# Patient Record
Sex: Female | Born: 1935 | Race: White | Hispanic: No | Smoking: Never smoker
Health system: Southern US, Community
[De-identification: ages and names within clinical notes are randomized; demographics above are authoritative.]

## PROBLEM LIST (undated history)

## (undated) DIAGNOSIS — I4891 Unspecified atrial fibrillation: Secondary | ICD-10-CM

## (undated) DIAGNOSIS — M5430 Sciatica, unspecified side: Secondary | ICD-10-CM

## (undated) DIAGNOSIS — E079 Disorder of thyroid, unspecified: Secondary | ICD-10-CM

## (undated) DIAGNOSIS — G8929 Other chronic pain: Secondary | ICD-10-CM

## (undated) DIAGNOSIS — F0150 Vascular dementia without behavioral disturbance: Secondary | ICD-10-CM

## (undated) DIAGNOSIS — I639 Cerebral infarction, unspecified: Secondary | ICD-10-CM

## (undated) DIAGNOSIS — M545 Low back pain, unspecified: Secondary | ICD-10-CM

## (undated) DIAGNOSIS — M1990 Unspecified osteoarthritis, unspecified site: Secondary | ICD-10-CM

## (undated) DIAGNOSIS — I10 Essential (primary) hypertension: Secondary | ICD-10-CM

## (undated) DIAGNOSIS — E785 Hyperlipidemia, unspecified: Secondary | ICD-10-CM

## (undated) DIAGNOSIS — I059 Rheumatic mitral valve disease, unspecified: Secondary | ICD-10-CM

## (undated) DIAGNOSIS — R209 Unspecified disturbances of skin sensation: Secondary | ICD-10-CM

## (undated) HISTORY — DX: Sciatica, unspecified side: M54.30

## (undated) HISTORY — DX: Rheumatic mitral valve disease, unspecified: I05.9

## (undated) HISTORY — DX: Essential (primary) hypertension: I10

## (undated) HISTORY — DX: Cerebral infarction, unspecified: I63.9

## (undated) HISTORY — DX: Unspecified disturbances of skin sensation: R20.9

## (undated) HISTORY — DX: Hyperlipidemia, unspecified: E78.5

## (undated) HISTORY — DX: Disorder of thyroid, unspecified: E07.9

## (undated) HISTORY — DX: Unspecified osteoarthritis, unspecified site: M19.90

## (undated) HISTORY — DX: Low back pain: M54.5

## (undated) HISTORY — DX: Low back pain, unspecified: M54.50

## (undated) HISTORY — DX: Other chronic pain: G89.29

## (undated) HISTORY — PX: THYROIDECTOMY: SHX17

## (undated) HISTORY — PX: JOINT REPLACEMENT: SHX530

## (undated) HISTORY — PX: KNEE SURGERY: SHX244

## (undated) HISTORY — DX: Vascular dementia, unspecified severity, without behavioral disturbance, psychotic disturbance, mood disturbance, and anxiety: F01.50

## (undated) HISTORY — DX: Unspecified atrial fibrillation: I48.91

---

## 2006-11-05 ENCOUNTER — Ambulatory Visit: Payer: Self-pay | Admitting: Gastroenterology

## 2006-12-26 ENCOUNTER — Ambulatory Visit: Payer: Self-pay | Admitting: Internal Medicine

## 2007-04-26 LAB — HM DEXA SCAN: HM Dexa Scan: ABNORMAL

## 2007-05-06 ENCOUNTER — Ambulatory Visit: Payer: Self-pay | Admitting: Internal Medicine

## 2007-07-31 ENCOUNTER — Ambulatory Visit: Payer: Self-pay | Admitting: Internal Medicine

## 2008-03-02 ENCOUNTER — Other Ambulatory Visit: Payer: Self-pay

## 2008-03-02 ENCOUNTER — Ambulatory Visit: Payer: Self-pay | Admitting: General Practice

## 2008-03-17 ENCOUNTER — Inpatient Hospital Stay: Payer: Self-pay | Admitting: General Practice

## 2008-05-11 ENCOUNTER — Ambulatory Visit: Payer: Self-pay | Admitting: Internal Medicine

## 2008-08-03 ENCOUNTER — Ambulatory Visit: Payer: Self-pay | Admitting: Ophthalmology

## 2009-03-01 ENCOUNTER — Ambulatory Visit: Payer: Self-pay | Admitting: Internal Medicine

## 2009-03-21 ENCOUNTER — Ambulatory Visit: Payer: Self-pay | Admitting: Ophthalmology

## 2009-05-31 ENCOUNTER — Ambulatory Visit: Payer: Self-pay | Admitting: Internal Medicine

## 2009-06-25 HISTORY — PX: TOTAL HIP ARTHROPLASTY: SHX124

## 2009-08-03 ENCOUNTER — Emergency Department: Payer: Self-pay | Admitting: Emergency Medicine

## 2009-09-15 ENCOUNTER — Encounter: Payer: Self-pay | Admitting: Cardiovascular Disease

## 2009-09-15 ENCOUNTER — Ambulatory Visit: Payer: Self-pay | Admitting: Internal Medicine

## 2009-09-23 LAB — HM COLONOSCOPY

## 2009-10-06 ENCOUNTER — Encounter: Admission: RE | Admit: 2009-10-06 | Discharge: 2009-10-06 | Payer: Self-pay | Admitting: Neurosurgery

## 2009-10-11 ENCOUNTER — Ambulatory Visit: Payer: Self-pay | Admitting: Gastroenterology

## 2009-10-25 ENCOUNTER — Encounter: Admission: RE | Admit: 2009-10-25 | Discharge: 2009-10-25 | Payer: Self-pay | Admitting: Neurosurgery

## 2010-01-31 ENCOUNTER — Ambulatory Visit: Payer: Self-pay | Admitting: Anesthesiology

## 2010-02-23 DIAGNOSIS — I4891 Unspecified atrial fibrillation: Secondary | ICD-10-CM

## 2010-02-23 HISTORY — DX: Unspecified atrial fibrillation: I48.91

## 2010-03-01 ENCOUNTER — Encounter: Payer: Self-pay | Admitting: Cardiovascular Disease

## 2010-03-02 ENCOUNTER — Ambulatory Visit: Payer: Self-pay | Admitting: Cardiovascular Disease

## 2010-03-02 ENCOUNTER — Ambulatory Visit: Payer: Self-pay | Admitting: Anesthesiology

## 2010-03-02 ENCOUNTER — Inpatient Hospital Stay: Payer: Self-pay | Admitting: Internal Medicine

## 2010-03-13 ENCOUNTER — Emergency Department: Payer: Self-pay | Admitting: Emergency Medicine

## 2010-03-15 ENCOUNTER — Ambulatory Visit: Payer: Self-pay | Admitting: Anesthesiology

## 2010-03-16 ENCOUNTER — Ambulatory Visit: Payer: Self-pay | Admitting: Internal Medicine

## 2010-03-30 ENCOUNTER — Ambulatory Visit: Payer: Self-pay | Admitting: Cardiovascular Disease

## 2010-03-30 DIAGNOSIS — R609 Edema, unspecified: Secondary | ICD-10-CM | POA: Insufficient documentation

## 2010-03-30 DIAGNOSIS — I10 Essential (primary) hypertension: Secondary | ICD-10-CM

## 2010-03-30 DIAGNOSIS — I482 Chronic atrial fibrillation, unspecified: Secondary | ICD-10-CM

## 2010-04-07 ENCOUNTER — Ambulatory Visit: Payer: Self-pay | Admitting: Orthopedic Surgery

## 2010-04-14 ENCOUNTER — Inpatient Hospital Stay: Payer: Self-pay | Admitting: Orthopedic Surgery

## 2010-04-18 LAB — PATHOLOGY REPORT

## 2010-06-30 ENCOUNTER — Encounter: Payer: Self-pay | Admitting: Cardiovascular Disease

## 2010-07-04 ENCOUNTER — Encounter: Payer: Self-pay | Admitting: Cardiovascular Disease

## 2010-07-04 ENCOUNTER — Ambulatory Visit
Admission: RE | Admit: 2010-07-04 | Discharge: 2010-07-04 | Payer: Self-pay | Source: Home / Self Care | Attending: Cardiovascular Disease | Admitting: Cardiovascular Disease

## 2010-07-12 ENCOUNTER — Ambulatory Visit
Admission: RE | Admit: 2010-07-12 | Discharge: 2010-07-12 | Payer: Self-pay | Source: Home / Self Care | Attending: Cardiovascular Disease | Admitting: Cardiovascular Disease

## 2010-07-12 ENCOUNTER — Encounter: Payer: Self-pay | Admitting: Cardiovascular Disease

## 2010-07-17 ENCOUNTER — Encounter: Payer: Self-pay | Admitting: Cardiovascular Disease

## 2010-07-25 NOTE — Assessment & Plan Note (Signed)
Summary: F/U [facility]   Visit Type:  Initial Consult Primary Provider:  Dr. Darrick Huntsman  CC:  F/U [facility].  Denies chest pain or shortness of breath..  History of Present Illness: [name] is a 77 year old woman, patient of Dr. Darrick Huntsman, with recent history of severe sciatic and back pain who was admitted to the hospital in early September with tachycardia, diagnosed with atrial fibrillation, started on warfarin and rate control who presents today for routine followup.  She reports that she has no symptoms of shortness of breath or chest discomfort. She continues to have severe back discomfort radiating down her right leg. She does have some mild edema in her legs though she is unable to put her compression hose on. This is new since leaving the hospital she feels. She is not able to walk very fast secondary to her back pain.  EKG shows atrial fibrillation with ventricular rate 76 beats per minute, nonspecific ST changes in anterolateral leads and inferior leads.  echocardiogram shows normal systolic function, ejection fraction 55%, mild LVH with diastolic dysfunction, mildly dilated left atrium, mild to moderate MR, moderate TR with elevated right ventricular systolic pressures consistent with mild pulmonary hypertension  Preventive Screening-Counseling & Management  Alcohol-Tobacco     Smoking Status: never  Caffeine-Diet-Exercise     Does Patient Exercise: yes  Current Medications (verified): 1)  Warfarin Sodium 3 Mg Tabs (Warfarin Sodium) .... Take As Directed 2)  Armour Thyroid 60 Mg Tabs (Thyroid) .... One Tablet Once Daily 3)  Trazodone Hcl 50 Mg Tabs (Trazodone Hcl) .... One Tablet At Bedtime 4)  Pravastatin Sodium 20 Mg Tabs (Pravastatin Sodium) .... One Tablet At Bedtime 5)  Alendronate Sodium 70 Mg Tabs (Alendronate Sodium) .... One Tablet Once A Week 6)  Tylenol Pm Extra Strength 500-25 Mg Tabs (Diphenhydramine-Apap (Sleep)) .... As Needed 7)  Glucosamine Chondroitin Vit D3  Caps (Misc  Natural Products) 8)  Ibuprofen 800 Mg Tabs (Ibuprofen) .... One Tablet Once Daily 9)  Tramadol Hcl 50 Mg Tabs (Tramadol Hcl) .... As Needed 10)  Fish Oil 1000 Mg Caps (Omega-3 Fatty Acids) .... One Tablet Once Daily 11)  Calcium 1200 Mg 12)  Selenium 200 Mcg Tabs (Selenium) .... One Tablet Once Daily 13)  L-Lysine 500 Mg Tabs (Lysine) .... One Tablet Once Daily 14)  Vitamin C 500 Mg .... One Tablet Once Daily 15)  Digoxin 0.125 Mg Tabs (Digoxin) .... One Tablet Once Daily 16)  Cardizem Cd 180 Mg Xr24h-Cap (Diltiazem Hcl Coated Beads) .... One Tablet Once Daily  Allergies (verified): 1)  ! Sulfa  Past History:  Family History: Last updated: 2010-03-31 Father:Deceased; unknown Mother:CHF; deceased age 30 Siblings: 1 living brother; good health  Social History: Last updated: March 31, 2010 Tobacco Use - No.  Alcohol Use - no Retired  Married lives at the [facility] Regular Exercise - yes--swims laps twice a week. walks 5 days weekly.  Half hour exercise class 5 days weekly.  Risk Factors: Exercise: yes (31-Mar-2010)  Risk Factors: Smoking Status: never (31-Mar-2010)  Past Medical History: Chronic low back pain due to lumbosacral degenerative joint disease. Hypertension arthritis   Past Surgical History: Left knee surgery Thyroidectomy, s/p patient on thyroid replacement.  Family History: Father:Deceased; unknown Mother:CHF; deceased age 37 Siblings: 1 living brother; good health  Social History: Tobacco Use - No.  Alcohol Use - no Retired  Married lives at the [facility] Regular Exercise - yes--swims laps twice a week. walks 5 days weekly.  Half hour exercise  class 5 days weekly. Smoking Status:  never Does Patient Exercise:  yes  Review of Systems       The patient complains of difficulty walking and peripheral edema.  The patient denies fever, weight loss, weight gain, vision loss, decreased hearing, hoarseness, chest pain, syncope, dyspnea on  exertion, prolonged cough, abdominal pain, incontinence, muscle weakness, depression, and enlarged lymph nodes.         Back pain  Vital Signs:  Patient profile:   77 year old female Height:      63 inches Weight:      120.50 pounds BMI:     21.42 Pulse rate:   79 / minute BP sitting:   138 / 78  (left arm) Cuff size:   regular  Vitals Entered By: Bishop Dublin, CMA (March 30, 2010 10:36 AM)  Physical Exam  General:  Well developed, well nourished, in no acute distress. Head:  normocephalic and atraumatic Neck:  Neck supple, no JVD. No masses, thyromegaly or abnormal cervical nodes. Chest Wall:  no deformities or breast masses noted Lungs:  Clear bilaterally to auscultation and percussion. Heart:  Non-displaced PMI, chest non-tender; irregular rate and rhythm, S1, S2 without murmurs, rubs or gallops. Carotid upstroke normal, no bruit. Normal abdominal aortic size, no bruits. Femorals normal pulses, no bruits. Pedals normal pulses. Trace b/l edema, no varicosities. Abdomen:  Bowel sounds positive; abdomen soft and non-tender without masses Msk:  Back normal, normal gait. Muscle strength and tone normal. Pulses:  pulses normal in all 4 extremities Extremities:  No clubbing or cyanosis. Neurologic:  Alert and oriented x 3. Skin:  Intact without lesions or rashes. Psych:  Normal affect.   Impression & Recommendations:  Problem # 1:  ATRIAL FIBRILLATION (ICD-427.31) chronic atrial fibrillation, on warfarin with adequate rate control She is to have back surgery at the end of the month and we will stop her warfarin 7 days prior to surgery and restart the warfarin following surgery She would be acceptable risk for the surgery given no history of coronary artery disease. we would suggest that IV fluids be minimized any perioperative and postoperative period.  Her updated medication list for this problem includes:    Warfarin Sodium 3 Mg Tabs (Warfarin sodium) .Marland Kitchen... Take as  directed    Digoxin 0.125 Mg Tabs (Digoxin) ..... One tablet once daily  Problem # 2:  EDEMA (ICD-782.3) she does have trace edema bilaterally. This could be from her atrial fibrillation with mild fluid overload as seen on her echocardiogram. Calcium channel blocker also may exacerbate her edema. I've given her Lasix 20 mg to take p.r.n. for edema.  Problem # 3:  HYPERTENSION, BENIGN (ICD-401.1) Blood pressure is well-controlled on her current medication regimen.  Her updated medication list for this problem includes:    Cardizem Cd 180 Mg Xr24h-cap (Diltiazem hcl coated beads) ..... One tablet once daily    Furosemide 20 Mg Tabs (Furosemide) .Marland Kitchen... Take one tablet by mouth daily as needed for shortness of breath  Patient Instructions: 1)  Your physician has recommended you make the following change in your medication: HOLD coumadin starting on October 20th if your surgery is October 27th. START furosemide 20mg  daily as needed for shortness of breath.  2)  Your physician wants you to follow-up in:   3 months You will receive a reminder letter in the mail two months in advance. If you don't receive a letter, please call our office to schedule the follow-up appointment. Prescriptions: FUROSEMIDE 20  MG TABS (FUROSEMIDE) Take one tablet by mouth daily as needed for shortness of breath  #30 x 2   Entered by:   Benedict Needy, RN   Authorized by:   Dossie Arbour MD   Signed by:   Benedict Needy, RN on 03/30/2010   Method used:   Electronically to        [id number]       [address]       [place], [place]  [address]       Ph: [phone]       Fax: [phone]   RxID:   [id number]

## 2010-07-26 ENCOUNTER — Ambulatory Visit: Admit: 2010-07-26 | Payer: Self-pay

## 2010-07-26 ENCOUNTER — Encounter: Payer: Self-pay | Admitting: Cardiovascular Disease

## 2010-07-26 ENCOUNTER — Other Ambulatory Visit (INDEPENDENT_AMBULATORY_CARE_PROVIDER_SITE_OTHER): Payer: Medicare Other

## 2010-07-26 DIAGNOSIS — I4891 Unspecified atrial fibrillation: Secondary | ICD-10-CM

## 2010-07-27 ENCOUNTER — Encounter: Payer: Self-pay | Admitting: Cardiovascular Disease

## 2010-07-27 NOTE — Assessment & Plan Note (Signed)
Summary: F1W/AMD   Visit Type:  Follow-up Primary Provider:  Dr. Darrick Huntsman  CC:  "doing well". Denies SOB and chest pain.Marland Kitchen  History of Present Illness: Donna Rosario is a 77 year old woman, patient of Dr. Darrick Huntsman, with recent history of severe sciatic and back pain, "crushed right hip", s/p right hip replacement,  who was admitted to the hospital in early September  with atrial fibrillation, started on warfarin and rate control who presents today for routine followup.  she reports that on a higher dose Lasix, her edema has persisted. Her weight has come down several pounds. She denies any shortness of breath. She is uncertain of which medications she is taking though reports that she handles all of her medications by herself. She has not been swimming as she normally does because of her recent hip surgery.   when asked if she was still taking warfarin, she was unsure and then she reported that Dr. Darrick Huntsman had stopped the medication. She was uncertain if she was taking Cardizem/diltiazem.  EKG shows atrial fibrillation with ventricular rate of 65 beats per minute, no significant ST or T wave changes  echocardiogram shows normal systolic function, ejection fraction 55%, mild LVH with diastolic dysfunction, mildly dilated left atrium, mild to moderate MR, moderate TR with elevated right ventricular systolic pressures consistent with mild pulmonary hypertension  Current Medications (verified): 1)  Warfarin Sodium 3 Mg Tabs (Warfarin Sodium) .... Take As Directed 2)  Armour Thyroid 60 Mg Tabs (Thyroid) .... One Tablet Once Daily 3)  Trazodone Hcl 50 Mg Tabs (Trazodone Hcl) .... One Tablet At Bedtime 4)  Pravastatin Sodium 20 Mg Tabs (Pravastatin Sodium) .... One Tablet At Bedtime 5)  Alendronate Sodium 70 Mg Tabs (Alendronate Sodium) .... One Tablet Once A Week 6)  Tylenol Pm Extra Strength 500-25 Mg Tabs (Diphenhydramine-Apap (Sleep)) .... As Needed 7)  Glucosamine Chondroitin Vit D3  Caps (Misc Natural  Products) 8)  Ibuprofen 800 Mg Tabs (Ibuprofen) .... One Tablet Once Daily 9)  Tramadol Hcl 50 Mg Tabs (Tramadol Hcl) .... As Needed 10)  Fish Oil 1000 Mg Caps (Omega-3 Fatty Acids) .... One Tablet Once Daily 11)  Calcium 1200 Mg 12)  Selenium 200 Mcg Tabs (Selenium) .... One Tablet Once Daily 13)  L-Lysine 500 Mg Tabs (Lysine) .... One Tablet Once Daily 14)  Vitamin C 500 Mg .... One Tablet Once Daily 15)  Digoxin 0.125 Mg Tabs (Digoxin) .... One Tablet Once Daily 16)  Cardizem Cd 180 Mg Xr24h-Cap (Diltiazem Hcl Coated Beads) .... One Tablet Once Daily 17)  Furosemide 40 Mg Tabs (Furosemide) .... One Tablet Two Times A Day 18)  Klor-Con M20 20 Meq Cr-Tabs (Potassium Chloride Crys Cr) .... One Tablet Two Times A Day  Allergies (verified): 1)  ! Sulfa  Review of Systems       The patient complains of weight gain and peripheral edema.  The patient denies fever, weight loss, vision loss, decreased hearing, hoarseness, chest pain, syncope, dyspnea on exertion, prolonged cough, abdominal pain, incontinence, muscle weakness, depression, and enlarged lymph nodes.    Vital Signs:  Patient profile:   77 year old female Height:      63 inches Weight:      128.50 pounds BMI:     22.85 Pulse rate:   94 / minute BP sitting:   128 / 69  (left arm) Cuff size:   regular  Vitals Entered By: Lysbeth Galas CMA (July 12, 2010 10:31 AM)  Physical Exam  General:  Well developed,  well nourished, in no acute distress. Head:  normocephalic and atraumatic Neck:  Neck supple, no JVD. No masses, thyromegaly or abnormal cervical nodes. Lungs:  Clear bilaterally to auscultation and percussion. Heart:  Non-displaced PMI, chest non-tender; irregular rate and rhythm, S1, S2 without murmurs, rubs or gallops. Carotid upstroke normal, no bruit. Pedals normal pulses. 2+ pitting b/l LE edema, no varicosities. Abdomen:  Bowel sounds positive; abdomen soft and non-tender without masses Msk:  Back normal, normal  gait. Muscle strength and tone normal. Pulses:  pulses normal in all 4 extremities Extremities:  No clubbing or cyanosis. Neurologic:  Alert and oriented x 3. Skin:  Intact without lesions or rashes. Psych:  Normal affect.   Impression & Recommendations:  Problem # 1:  EDEMA (ICD-782.3) etiology of her edema is concerning for side effects from the calcium channel blocker. There has been no significant improvement on higher dose diuretic. She reports that she had blood work done with Dr. Darrick Huntsman last week and we will try to obtain these numbers for our records. We will discontinue the Cardizem and start metoprolol tartrate 50 mg b.i.d..  We will have her followup in one month's time.  Problem # 2:  ATRIAL FIBRILLATION (ICD-427.31) continues to be in atrial fibrillation with rate in the 60s. She reported that she is unsure if she is on warfarin or not.  I am concerned about possible underlying dementia and polypharmacy. I suggested to her that we have a nurse come to her house to help her with her medications. She is in agreement with this. This would probably be a good idea while we are changing medications in an attempt to improve her edema.  Her updated medication list for this problem includes:    Warfarin Sodium 3 Mg Tabs (Warfarin sodium) .Marland Kitchen... Take as directed    Digoxin 0.125 Mg Tabs (Digoxin) ..... One tablet once daily    Metoprolol Tartrate 50 Mg Tabs (Metoprolol tartrate) .Marland Kitchen... Take one tablet by mouth twice a day  Problem # 3:  HYPERTENSION, BENIGN (ICD-401.1) blood pressure well-controlled on her current medication. We will monitor her blood pressure after the medication changes above.  The following medications were removed from the medication list:    Cardizem Cd 180 Mg Xr24h-cap (Diltiazem hcl coated beads) ..... One tablet once daily Her updated medication list for this problem includes:    Furosemide 40 Mg Tabs (Furosemide) ..... One tablet two times a day     Metoprolol Tartrate 50 Mg Tabs (Metoprolol tartrate) .Marland Kitchen... Take one tablet by mouth twice a day  Patient Instructions: 1)  Your physician recommends that you schedule a follow-up appointment in: 1 MONTH 2)  Your physician has recommended you make the following change in your medication: STOP taking Cardizem. START Metoprolol Tartrate 50mg  two times a day. Prescriptions: METOPROLOL TARTRATE 50 MG TABS (METOPROLOL TARTRATE) Take one tablet by mouth twice a day  #60 x 6   Entered by:   Lanny Hurst RN   Authorized by:   Dossie Arbour MD   Signed by:   Lanny Hurst RN on 07/12/2010   Method used:   Electronically to        [id number]       [address]       [place], [place]  [address]       Ph: [phone]       Fax: [phone]   RxID:   [id number]

## 2010-07-27 NOTE — Assessment & Plan Note (Signed)
Summary: F3M/AMD   Visit Type:  Follow-up Primary Provider:  Dr. Darrick Huntsman  CC:  "Doing well other than recovering from hip surgery" denies chest pain, SOB, and and palpitations.  History of Present Illness: [name] is a 77 year old woman, patient of Dr. Darrick Huntsman, with recent history of severe sciatic and back pain, "crushed right hip", s/p right hip replacement,  who was admitted to the hospital in early September with tachycardia, diagnosed with atrial fibrillation, started on warfarin and rate control who presents today for routine followup.  She reports that she has no symptoms of shortness of breath or chest discomfort. She reports that she feels better after her right hip replacement surgery. She does have significant lower extremity edema which is new since the surgery. She believes they gave her significant fluids in the hospital and told her that it would go away on its own. Her legs are sore, red and her weight is up 12 pounds from her baseline.  old EKG shows atrial fibrillation with ventricular rate 76 beats per minute, nonspecific ST changes in anterolateral leads and inferior leads.  echocardiogram shows normal systolic function, ejection fraction 55%, mild LVH with diastolic dysfunction, mildly dilated left atrium, mild to moderate MR, moderate TR with elevated right ventricular systolic pressures consistent with mild pulmonary hypertension  Current Medications (verified): 1)  Warfarin Sodium 3 Mg Tabs (Warfarin Sodium) .... Take As Directed 2)  Armour Thyroid 60 Mg Tabs (Thyroid) .... One Tablet Once Daily 3)  Trazodone Hcl 50 Mg Tabs (Trazodone Hcl) .... One Tablet At Bedtime 4)  Pravastatin Sodium 20 Mg Tabs (Pravastatin Sodium) .... One Tablet At Bedtime 5)  Alendronate Sodium 70 Mg Tabs (Alendronate Sodium) .... One Tablet Once A Week 6)  Tylenol Pm Extra Strength 500-25 Mg Tabs (Diphenhydramine-Apap (Sleep)) .... As Needed 7)  Glucosamine Chondroitin Vit D3  Caps (Misc  Natural Products) 8)  Ibuprofen 800 Mg Tabs (Ibuprofen) .... One Tablet Once Daily 9)  Tramadol Hcl 50 Mg Tabs (Tramadol Hcl) .... As Needed 10)  Fish Oil 1000 Mg Caps (Omega-3 Fatty Acids) .... One Tablet Once Daily 11)  Calcium 1200 Mg 12)  Selenium 200 Mcg Tabs (Selenium) .... One Tablet Once Daily 13)  L-Lysine 500 Mg Tabs (Lysine) .... One Tablet Once Daily 14)  Vitamin C 500 Mg .... One Tablet Once Daily 15)  Digoxin 0.125 Mg Tabs (Digoxin) .... One Tablet Once Daily 16)  Cardizem Cd 180 Mg Xr24h-Cap (Diltiazem Hcl Coated Beads) .... One Tablet Once Daily 17)  Furosemide 20 Mg Tabs (Furosemide) .... Take One Tablet By Mouth Daily As Needed For Shortness of Breath  Allergies (verified): 1)  ! Sulfa  Past History:  Past Medical History: Last updated: Apr 17, 2010 Chronic low back pain due to lumbosacral degenerative joint disease. Hypertension arthritis   Family History: Last updated: 04-17-2010 Father:Deceased; unknown Mother:CHF; deceased age 31 Siblings: 1 living brother; good health  Social History: Last updated: 04-17-10 Tobacco Use - No.  Alcohol Use - no Retired  Married lives at the [facility] Regular Exercise - yes--swims laps twice a week. walks 5 days weekly.  Half hour exercise class 5 days weekly.  Risk Factors: Exercise: yes (17-Apr-2010)  Risk Factors: Smoking Status: never (2010/04/17)  Past Surgical History: Left knee surgery Thyroidectomy, s/p patient on thyroid replacement. Hip replacement-2011  Review of Systems       The patient complains of weight gain and peripheral edema.  The patient denies fever, weight loss, vision loss, decreased hearing, hoarseness,  chest pain, syncope, dyspnea on exertion, prolonged cough, abdominal pain, incontinence, muscle weakness, depression, and enlarged lymph nodes.    Vital Signs:  Patient profile:   77 year old female Height:      63 inches Weight:      132.25 pounds BMI:     23.51 Pulse rate:    76 / minute BP sitting:   138 / 62  (left arm) Cuff size:   regular  Vitals Entered By: Lysbeth Galas CMA (July 04, 2010 10:20 AM)  Physical Exam  General:  Well developed, well nourished, in no acute distress. Head:  normocephalic and atraumatic Neck:  Neck supple, no JVD. No masses, thyromegaly or abnormal cervical nodes. Lungs:  Clear bilaterally to auscultation and percussion. Heart:  Non-displaced PMI, chest non-tender; irregular rate and rhythm, S1, S2 without murmurs, rubs or gallops. Carotid upstroke normal, no bruit. Pedals normal pulses. 2+ pitting b/l LE edema, no varicosities. Abdomen:  Bowel sounds positive; abdomen soft and non-tender without masses Msk:  Back normal, normal gait. Muscle strength and tone normal. Pulses:  pulses normal in all 4 extremities Extremities:  No clubbing or cyanosis. Neurologic:  Alert and oriented x 3. Skin:  Intact without lesions or rashes. Psych:  Normal affect.   Impression & Recommendations:  Problem # 1:  ATRIAL FIBRILLATION (ICD-427.31) chronic atrial fibrillation. Rate is well controlled on today's visit. Continue warfarin.  Her updated medication list for this problem includes:    Warfarin Sodium 3 Mg Tabs (Warfarin sodium) .Marland Kitchen... Take as directed    Digoxin 0.125 Mg Tabs (Digoxin) ..... One tablet once daily  Problem # 2:  EDEMA (ICD-782.3) Bilateral lower extremity edema is new and significant. She is taking Lasix 20 mg daily with no relief with 12 pound weight gain. We will increase her Lasix to 40 mg b.i.d. with Joyce Gross Ciel 20 mg b.i.d.. We will have her followup in one week time.  Previous echo has shown mild pulmonary hypertension, mitral valve regurgitation, normal systolic function  Problem # 3:  HYPERTENSION, BENIGN (ICD-401.1) blood pressure is reasonably well controlled on her current medication regimen.  The following medications were removed from the medication list:    Furosemide 20 Mg Tabs (Furosemide) .Marland Kitchen...  Take one tablet by mouth daily as needed for shortness of breath Her updated medication list for this problem includes:    Cardizem Cd 180 Mg Xr24h-cap (Diltiazem hcl coated beads) ..... One tablet once daily    Furosemide 40 Mg Tabs (Furosemide) ..... One tablet two times a day  Patient Instructions: 1)  Your physician recommends that you schedule a follow-up appointment in: 1 week 2)  Your physician has recommended you make the following change in your medication: Start on Lasix 40 mg one tablet two times a day( take one in the a.m. and one around 2:00 afternoon. KCL 20 meq two times a day.   Prescriptions: KLOR-CON M20 20 MEQ CR-TABS (POTASSIUM CHLORIDE CRYS CR) one tablet two times a day  #60 x 3   Entered by:   Bishop Dublin, CMA   Authorized by:   Dossie Arbour MD   Signed by:   Bishop Dublin, CMA on 07/04/2010   Method used:   Electronically to        [id number]       [address]       [place], [place]  [address]       Ph: [phone]       Fax:  [phone]   RxID:   [id number] FUROSEMIDE 40 MG TABS (FUROSEMIDE) one tablet two times a day  #60 x 3   Entered by:   Bishop Dublin, CMA   Authorized by:   Dossie Arbour MD   Signed by:   Bishop Dublin, CMA on 07/04/2010   Method used:   Electronically to        [id number]       [address]       [place], [place]  [address]       Ph: [phone]       Fax: [phone]   RxID:   [id number]

## 2010-07-27 NOTE — Miscellaneous (Signed)
Summary: Home Care Report  Home Care Report   Imported By: Harlon Flor 07/17/2010 09:29:21  _____________________________________________________________________  External Attachment:    Type:   Image     Comment:   External Document

## 2010-08-01 ENCOUNTER — Ambulatory Visit: Payer: Self-pay | Admitting: Internal Medicine

## 2010-08-02 NOTE — Miscellaneous (Signed)
Summary: Home Care Report  Home Care Report   Imported By: Harlon Flor 07/28/2010 12:40:40  _____________________________________________________________________  External Attachment:    Type:   Image     Comment:   External Document

## 2010-08-02 NOTE — Miscellaneous (Signed)
  Clinical Lists Changes  Medications: Removed medication of WARFARIN SODIUM 3 MG TABS (WARFARIN SODIUM) take as directed

## 2010-08-03 ENCOUNTER — Encounter: Payer: Self-pay | Admitting: Cardiovascular Disease

## 2010-08-04 ENCOUNTER — Encounter: Payer: Self-pay | Admitting: Cardiovascular Disease

## 2010-08-15 ENCOUNTER — Encounter: Payer: Self-pay | Admitting: Cardiovascular Disease

## 2010-08-15 ENCOUNTER — Ambulatory Visit (INDEPENDENT_AMBULATORY_CARE_PROVIDER_SITE_OTHER): Payer: Medicare Other | Admitting: Cardiovascular Disease

## 2010-08-15 DIAGNOSIS — I519 Heart disease, unspecified: Secondary | ICD-10-CM | POA: Insufficient documentation

## 2010-08-15 DIAGNOSIS — I4891 Unspecified atrial fibrillation: Secondary | ICD-10-CM

## 2010-08-15 DIAGNOSIS — R609 Edema, unspecified: Secondary | ICD-10-CM

## 2010-08-15 DIAGNOSIS — I279 Pulmonary heart disease, unspecified: Secondary | ICD-10-CM

## 2010-08-16 ENCOUNTER — Encounter: Payer: Self-pay | Admitting: Cardiovascular Disease

## 2010-08-16 NOTE — Miscellaneous (Signed)
[facility]: [facility]   Imported By: Harlon Flor 08/10/2010 11:31:59  _____________________________________________________________________  External Attachment:    Type:   Image     Comment:   External Document

## 2010-08-17 ENCOUNTER — Telehealth: Payer: Self-pay | Admitting: Cardiovascular Disease

## 2010-08-22 NOTE — Medication Information (Signed)
Summary: [facility] Medication List  [facility] Medication List   Imported By: Harlon Flor 08/14/2010 13:19:42  _____________________________________________________________________  External Attachment:    Type:   Image     Comment:   External Document

## 2010-08-22 NOTE — Assessment & Plan Note (Signed)
Summary: F/U 1 MONTH/SAB   Visit Type:  Follow-up Primary Provider:  Dr. Darrick Huntsman  CC:  c/o legs itching and red marks for 3 months. Denies chest pain and SOB.  History of Present Illness: Donna Rosario is a 77 year old woman, patient of Dr. Darrick Huntsman, with recent history of severe sciatic and back pain, "crushed right hip", s/p right hip replacement,  who was admitted to the hospital in early September 2011 with atrial fibrillation, started on warfarin and rate control, Worsening dementia and confusion with polypharmacy, recently taken off warfarin secondary to inability to manage this herself, recent lower extremity edema who presents for routine followup.  we had [facility] go to her house and sore to her medications. Per the patient and the nurse, several medications were thrown away that she did not need to take. Notes indicate that she was taking some of her husband's medications including losartan and potassium. On followup phone call to [facility], they have stopped seeing the patient as the patient did not want them to followup.   She presents today with 6 of her medications in a bag that accurately match the list provided from nursing at her house. Her edema is persistent and she now has a rash on her legs and arms, as well as chest,  that she reports has been there for at least one month. she reports drinking a liter of Pepsi a day with significant fluid. She has a dry mouth towards the end of the day.  EKG shows atrial fibrillation with ventricular rate of 75 beats per minute, no significant ST or T wave changes  echocardiogram shows normal systolic function, ejection fraction 55%, mild LVH with diastolic dysfunction, mildly dilated left atrium, mild to moderate MR, moderate TR with elevated right ventricular systolic pressures consistent with mild pulmonary hypertension  Current Medications (verified): 1)  Armour Thyroid 60 Mg Tabs (Thyroid) .... One Tablet Once  Daily 2)  Trazodone Hcl 50 Mg Tabs (Trazodone Hcl) .... One Tablet At Bedtime 3)  Pravastatin Sodium 20 Mg Tabs (Pravastatin Sodium) .... One Tablet At Bedtime 4)  Alendronate Sodium 70 Mg Tabs (Alendronate Sodium) .... One Tablet Once A Week 5)  Tylenol Pm Extra Strength 500-25 Mg Tabs (Diphenhydramine-Apap (Sleep)) .... As Needed 6)  Glucosamine Chondroitin Vit D3  Caps (Misc Natural Products) 7)  Ibuprofen 800 Mg Tabs (Ibuprofen) .... One Tablet Once Daily 8)  Tramadol Hcl 50 Mg Tabs (Tramadol Hcl) .... As Needed 9)  Fish Oil 1000 Mg Caps (Omega-3 Fatty Acids) .... One Tablet Once Daily 10)  Calcium 1200 Mg 11)  Selenium 200 Mcg Tabs (Selenium) .... One Tablet Once Daily 12)  L-Lysine 500 Mg Tabs (Lysine) .... One Tablet Once Daily 13)  Vitamin C 500 Mg .... One Tablet Once Daily 14)  Digoxin 0.125 Mg Tabs (Digoxin) .... One Tablet Once Daily 15)  Furosemide 40 Mg Tabs (Furosemide) .... One Tablet Two Times A Day 16)  Klor-Con M20 20 Meq Cr-Tabs (Potassium Chloride Crys Cr) .... One Tablet Two Times A Day 17)  Metoprolol Tartrate 50 Mg Tabs (Metoprolol Tartrate) .... Take One Tablet By Mouth Twice A Day  Allergies (verified): 1)  ! Sulfa  Past History:  Past Medical History: Last updated: 04-07-10 Chronic low back pain due to lumbosacral degenerative joint disease. Hypertension arthritis   Past Surgical History: Last updated: 07/04/2010 Left knee surgery Thyroidectomy, s/p patient on thyroid replacement. Hip replacement-2011  Family History: Last updated: 04/07/10 Father:Deceased; unknown Mother:CHF; deceased  age 37 Siblings: 1 living brother; good health  Social History: Last updated: 03/30/2010 Tobacco Use - No.  Alcohol Use - no Retired  Married lives at the [facility] Regular Exercise - yes--swims laps twice a week. walks 5 days weekly.  Half hour exercise class 5 days weekly.  Risk Factors: Exercise: yes (03/30/2010)  Risk Factors: Smoking  Status: never (03/30/2010)  Review of Systems       The patient complains of dyspnea on exertion.  The patient denies fever, weight loss, weight gain, vision loss, decreased hearing, hoarseness, chest pain, syncope, peripheral edema, prolonged cough, abdominal pain, incontinence, muscle weakness, depression, and enlarged lymph nodes.         rash on arms, legs and chest  Vital Signs:  Patient profile:   77 year old female Height:      63 inches Weight:      126.50 pounds BMI:     22.49 Pulse rate:   71 / minute BP sitting:   115 / 74  (left arm) Cuff size:   regular  Vitals Entered By: Lysbeth Galas CMA (August 15, 2010 9:49 AM)  Physical Exam  General:  Well developed, well nourished, in no acute distress. Head:  normocephalic and atraumatic Neck:  Neck supple, no JVD. No masses, thyromegaly or abnormal cervical nodes. Lungs:  Clear bilaterally to auscultation and percussion. Heart:  Non-displaced PMI, chest non-tender; irregular rate and rhythm, S1, S2 without murmurs, rubs or gallops. Carotid upstroke normal, no bruit. Pedals normal pulses. Trace to 1+ pitting b/l LE edema, no varicosities. Abdomen:  Bowel sounds positive; abdomen soft and non-tender without masses Msk:  Back normal, normal gait. Muscle strength and tone normal. Pulses:  pulses normal in all 4 extremities Extremities:  No clubbing or cyanosis. Neurologic:  Alert  Skin:  Mild diffuse macular rash seen on forearms, legs, to a mild degree on her chest and back Psych:  Normal affect.   Impression & Recommendations:  Problem # 1:  EDEMA (ICD-782.3) etiology of her edema is likely secondary to continued mild fluid overload. Possible contribution from venous insufficiency. She drinks 1 L of Pepsi a day with significant water. She does have  diastolic dysfunction and pulmonary HTN on echo. we have instructed her to cut back on her Pepsi and fluid intake, continue Lasix 40 mg b.i.d..  She does have TED hose  though she does have trouble putting these on herself. If we are able to establish a regular nurse visit, I would encourage her to use the nurse to place or TED hose on.  Followup phone call [facility] has indicated that the patient did not want to nurse to followup after an initial polypharmacy visit and [facility] has discontinued their service to her.  Problem # 2:  ATRIAL FIBRILLATION (ICD-427.31) rate is well controlled on metoprolol and digoxin. We have talked to her about anticoagulation and her stroke risk. As she does not have regular nursing visits ( she has refused them), we could hold on warfarin and try pradaxa 150 mg b.i.d. with close monitoring of her prescription.  [facility], set up a nursing visit in 2 weeks' time to follow up on a prescription. If she is taking the medication inappropriately, we will hold this medication. If she is unable to afford the medication, I am hesitant to restart warfarin given she has no significant help at home. We will try  [facility] to see if they can place her medications in bubble wrap and deliver them to her.  Her updated medication list for this problem includes:    Digoxin 0.125 Mg Tabs (Digoxin) ..... One tablet once daily    Metoprolol Tartrate 50 Mg Tabs (Metoprolol tartrate) .Marland Kitchen... Take one tablet by mouth twice a day  Orders: EKG w/ Interpretation (93000)  Problem # 3:  HYPERTENSION, BENIGN (ICD-401.1) Blood pressure is well-controlled on her current medication regimen.  Her updated medication list for this problem includes:    Furosemide 40 Mg Tabs (Furosemide) ..... One tablet two times a day    Metoprolol Tartrate 50 Mg Tabs (Metoprolol tartrate) .Marland Kitchen... Take one tablet by mouth twice a day  Problem # 4:  DIASTOLIC DYSFUNCTION (ICD-429.9) Diastolic dysfunction seen on echocardiogram also with mild pulmonary hypertension. She is relatively asymptomatic  from her atrial fibrillation and we will not attempt cardioversion either pharmacologically or with DC cardioversion at this time. Her underlying dementia would make this a logistical complication.  We'll continue her diuretic, we have instructed her to decrease her fluid intake, watch her salt intake. The high volume of Pepsi is likely contributing to her edema, though there is some concern of venous insufficiency and she would likely benefit from TED hose.  Patient Instructions: 1)  Your physician recommends that you schedule a follow-up appointment in: With Dr. Mariah Milling in 1 month. With the nurse in 2 weeks. PLEASE BRING ALL YOUR MEDICATIONS TO THIS VISIT. 2)  Your physician has recommended you make the following change in your medication: START Pradaxa 150mg  two times a day. Prescriptions: PRADAXA 150 MG CAPS (DABIGATRAN ETEXILATE MESYLATE) Take one tablet by mouth two times a day.  #60 x 6   Entered by:   Donna Hurst RN   Authorized by:   Dossie Arbour MD   Signed by:   Donna Hurst RN on 08/15/2010   Method used:   [id number]       [address]       [place], [place]  [address]       Ph: [phone]       Fax: [phone]   RxID:   [id number]   Appended Document: F/U 1 MONTH/SAB We managed to identify her daughters and discuss the situation with them. They were starting to believe that their mother was having problems and they will now become more involved in her care. We will keep in contact with them as we make any medication changes.

## 2010-08-22 NOTE — Consult Note (Signed)
Summary: [facility]: Consultation Report  [facility]: Consultation Report   Imported By: Earl Many 08/16/2010 10:29:25  _____________________________________________________________________  External Attachment:    Type:   Image     Comment:   External Document

## 2010-08-22 NOTE — Miscellaneous (Signed)
[facility]: [facility]   Imported By: Harlon Flor 08/18/2010 14:21:39  _____________________________________________________________________  External Attachment:    Type:   Image     Comment:   External Document

## 2010-08-22 NOTE — Progress Notes (Signed)
  Phone Note From Other Clinic   Caller: Nurse Elita Quick) [facility] Details for Reason: Medical management  Summary of Call: She did not want to do the telehealth but she did okay her to come by several days during the week to make sure she is taking her meciation correctly.  If you need to talk to her call @ [phone]. Initial call taken by: Bishop Dublin, CMA,  August 17, 2010 3:16 PM

## 2010-08-25 ENCOUNTER — Telehealth: Payer: Self-pay | Admitting: Cardiovascular Disease

## 2010-08-26 ENCOUNTER — Encounter: Payer: Self-pay | Admitting: Cardiovascular Disease

## 2010-08-29 ENCOUNTER — Encounter: Payer: Self-pay | Admitting: Internal Medicine

## 2010-08-29 ENCOUNTER — Encounter (INDEPENDENT_AMBULATORY_CARE_PROVIDER_SITE_OTHER): Payer: Medicare Other

## 2010-08-29 DIAGNOSIS — I4891 Unspecified atrial fibrillation: Secondary | ICD-10-CM

## 2010-08-31 NOTE — Progress Notes (Addendum)
Summary: [facility]  Phone Note Call from Patient Call back at [phone]   Caller: Pam with [facility] Call For: Donna Rosario Summary of Call: Pt has told [facility] that they are nice and that she likes them, but that she does not need them anymore.  [facility] will be making a final visit next week to discharge the pt per pt request. Initial call taken by: Harlon Flor,  August 25, 2010 2:04 PM  Follow-up for Phone Call        would talk with her daughter     Appended Document: [facility] Spoke to pt's daughter Dedra Skeens), notified her that pt has denied nursing care again. Also, pt was in yesterday for nurse visit for medication review, pt had correct amount of Pradaxa in her bottle since she had started. Pt gave clear verbal response to what meds she was taking any for what reason. Note from [place] states that pt is receptive to teaching and shows good response to instruction. Daughter is very supportive of having HHRN visits if they are needed, and after we spoke we decided we would not push for any more visits at this time. But if we notice any other changes in cognition we will call daughter. Daughter lives in [place], but states if she is needed for any visit she would be glad to come.  Also, at nurse visit, pt had allergic reaction, hives/itching. Pradaxa is only new medication, so we had her stop Pradaxa and started on Prednisone for 6 days and Benadryl. Pt has f/u with Dr. Darrick Huntsman for reaction and will call Monday to f/u with her symptoms. Per Dr. Graciela Husbands we will talk about starting Xarelto due to problems with coumadin in the past. Pt also has f/u with Dr. Mariah Rosario 09/13/10. Pt's daughter states since she has f/u with MD's coming up she is ok with not having home heath at this time. She will be in town around [place], and states if any f/u is needed around that time she can come with pt to visit.

## 2010-09-05 NOTE — Miscellaneous (Signed)
Summary: [facility]   Imported By: Harlon Flor 08/30/2010 11:06:08  _____________________________________________________________________  External Attachment:    Type:   Image     Comment:   External Document

## 2010-09-05 NOTE — Miscellaneous (Signed)
[facility]   Imported By: Harlon Flor 08/31/2010 08:54:43  _____________________________________________________________________  External Attachment:    Type:   Image     Comment:   External Document

## 2010-09-05 NOTE — Assessment & Plan Note (Signed)
Summary: Office Visit - Infectious Disease  Nurse Visit   Vital Signs:  Patient profile:   77 year old female Height:      63 inches Weight:      127 pounds BMI:     22.58 Pulse rate:   64 / minute BP sitting:   122 / 68  (left arm) Cuff size:   regular  Vitals Entered By: Bishop Dublin, CMA (August 29, 2010 9:57 AM) CC: c/o red rash all over body.  Discuss medications. Comments Pt originally scheduled to come in so we could review Pradaxa medication to make sure pt is taking correctly. Pt started Pradaxa 08/17/10, counted pills, she has 35 after taking her AM dose, this is the correct amount pt should have after taking for 12 days. Pt has been receiving home health nursing to help with med administration due to signs of mental confusion/dementia, however, pt has refused again to have RN come to her home. Pt is still driving, so [facility] mentioned pt is not homebound and they cannot give service for her being homebound. Will contact daughter Waynette Buttery) who is very involved with pt's care, however, she lives out of town, and she is POA.  Upon assessment pt has hives BUE, BLE and Abdomen. Pt c/o of hives and itching starting about 3 days ago. Pt has been on Pradaxa for almost 2 weeks. Pt denies starting any new medication and/or eating anything different lately. Pt's only allergy is Sulfa. Discussed with Dr. Graciela Husbands, we will start pt on 6 days of Prednisone and Benadryl OTC. I scheduled pt f/u with Dr. Darrick Huntsman for this [facility] 09/01/10 for f/u of allergic rxn. We will stop her Pradaxa. After hives/itching cleared, we will talk about starting Xarelto. Pt understands she is at risk at this time for blood clot, however, that the risk of taking Pradaxa outweighs the benifit of stopping due to reacition.  Pt has not followed correct Coumadin dosing instructions in the past with result of INR 15. I will follow up with pt on Monday to get status of her symtpoms.   Past History:  Past Medical History: Last  updated: 2010/04/10 Chronic low back pain due to lumbosacral degenerative joint disease. Hypertension arthritis   Past Surgical History: Last updated: 07/04/2010 Left knee surgery Thyroidectomy, s/p patient on thyroid replacement. Hip replacement-2011  Family History: Last updated: 04/10/10 Father:Deceased; unknown Mother:CHF; deceased age 26 Siblings: 1 living brother; good health  Social History: Last updated: 2010-04-10 Tobacco Use - No.  Alcohol Use - no Retired  Married lives at the [facility] Regular Exercise - yes--swims laps twice a week. walks 5 days weekly.  Half hour exercise class 5 days weekly.  Risk Factors: Exercise: yes (10-Apr-2010)  Risk Factors: Smoking Status: never (Apr 10, 2010)   Current Medications (verified): 1)  Armour Thyroid 60 Mg Tabs (Thyroid) .... One Tablet Once Daily 2)  Trazodone Hcl 50 Mg Tabs (Trazodone Hcl) .... One Tablet At Bedtime 3)  Alendronate Sodium 70 Mg Tabs (Alendronate Sodium) .... One Tablet Once A Week 4)  Tylenol Pm Extra Strength 500-25 Mg Tabs (Diphenhydramine-Apap (Sleep)) .... As Needed 5)  Glucosamine Chondroitin Vit D3  Caps (Misc Natural Products) 6)  Ibuprofen 800 Mg Tabs (Ibuprofen) .... One Tablet Once Daily 7)  Fish Oil 1000 Mg Caps (Omega-3 Fatty Acids) .... One Tablet Once Daily 8)  Calcium 1200 Mg 9)  Selenium 200 Mcg Tabs (Selenium) .... One Tablet Once Daily 10)  L-Lysine 500 Mg Tabs (Lysine) .Marland KitchenMarland KitchenMarland Kitchen  One Tablet Once Daily 11)  Vitamin C 500 Mg .... One Tablet Once Daily 12)  Digoxin 0.125 Mg Tabs (Digoxin) .... One Tablet Once Daily 13)  Furosemide 40 Mg Tabs (Furosemide) .... One Tablet Two Times A Day 14)  Klor-Con M20 20 Meq Cr-Tabs (Potassium Chloride Crys Cr) .... One Tablet Two Times A Day 15)  Metoprolol Tartrate 50 Mg Tabs (Metoprolol Tartrate) .... Take One Tablet By Mouth Twice A Day 16)  Pradaxa 150 Mg Caps (Dabigatran Etexilate Mesylate) .... Take One Tablet By Mouth Two Times A Day. 17)   Felodipine 2.5 Mg Xr24h-Tab (Felodipine) .... One Tablet Once Daily  Allergies (verified): 1)  ! Sulfa  Patient Instructions: 1)  Your physician recommends that you schedule a follow-up appointment in: WITH DR. Darrick Huntsman THIS FRIDAY 09/01/10 @ 10:40 2)  Your physician has recommended you make the following change in your medication: STOP Pradaxa. START Prednisone 20mg  3 tablets once daily for 2 days, then 2 tablets once daily for 2 days, then 1 tablet once daily for 2 days, then stop. You may take Benadryl 25mg  OTC as directed. Please do not drive after taking Benadryl.   Visit Type:  Follow-up  CC:  c/o red rash all over body.  Discuss medications..  Prescriptions: PREDNISONE 20 MG TABS (PREDNISONE) Take 3 tablets for 2 days, then 2 tablets for 2 days, then 1 tablet for 2 days.  #12 x 0   Entered by:   Lanny Hurst RN   Authorized by:   Nathen May, MD, [facility]   Signed by:   Lanny Hurst RN on 08/29/2010   Method used:   [facility] to        [id number]       [address]       [place], [place]  [address]       Ph: [phone]       Fax: [phone]   RxID:   [id number]

## 2010-09-05 NOTE — Medication Information (Signed)
Summary: Med List from [facility]  Med List from [facility]   Imported By: Harlon Flor 08/30/2010 11:06:35  _____________________________________________________________________  External Attachment:    Type:   Image     Comment:   External Document

## 2010-09-13 ENCOUNTER — Encounter: Payer: Self-pay | Admitting: Cardiovascular Disease

## 2010-09-13 ENCOUNTER — Ambulatory Visit (INDEPENDENT_AMBULATORY_CARE_PROVIDER_SITE_OTHER): Payer: Medicare Other | Admitting: Cardiovascular Disease

## 2010-09-13 DIAGNOSIS — I4891 Unspecified atrial fibrillation: Secondary | ICD-10-CM

## 2010-09-13 DIAGNOSIS — I10 Essential (primary) hypertension: Secondary | ICD-10-CM

## 2010-09-13 DIAGNOSIS — I519 Heart disease, unspecified: Secondary | ICD-10-CM

## 2010-09-13 DIAGNOSIS — R609 Edema, unspecified: Secondary | ICD-10-CM

## 2010-09-13 NOTE — Assessment & Plan Note (Signed)
Edema is not significant though certainly a mild change from last visit. We'll continue Lasix with improved rate control.

## 2010-09-13 NOTE — Patient Instructions (Addendum)
Please confirm that you are taking metoprolol in the AM and the PM. Also confirm that you are taking digoxin daily. If not, please call the office. We have increased the digoxin to 0.25 mg daily (INCREASE TO  2 tab daily) We have increased the dose of the metoprolol to 75 mg in the Am and PM (INCREASE TO 1 1/2 TABLETS 2 TIMES DAILY) Monitor your heart rate at home and call the office with your numbers. Your physician recommends that you schedule a follow-up appointment in: 1 month

## 2010-09-13 NOTE — Progress Notes (Signed)
   Patient ID: Donna Rosario, female    DOB: 05/01/1923, 77 y.o.   MRN: [id number]  HPI Donna Rosario is a 77 year old woman, patient of Dr. Darrick Huntsman, with recent history of severe sciatic and back pain, "crushed right hip", s/p right hip replacement,  who was admitted to the hospital in early September 2011 with atrial fibrillation, started on warfarin and rate control, Worsening dementia and confusion with polypharmacy, recently taken off warfarin secondary to inability to manage this herself, recent lower extremity edema who presents for routine followup. She has been started on Pradaxa b.i.d.. We did have 2 various nursing agencies try to establish a relationship with her for polypharmacy. She has since discharged them and currently does not have help at home. Family does not live close by.  She presents today and reports having some weight gain and swelling in her legs. She thinks that she is taking her medications correctly. She knows some of the names such as metoprolol and digoxin and believes that she is taking these correctly. She is uncertain how long she has had swelling in her ankles. Weight has been increasing over the past several weeks, up at least 5 pounds or more.  In the past, she was drinking a significant amount of Pepsi.  EKG shows atrial fibrillation with ventricular rate of 119 beats per minute, no significant ST or T wave changes  echocardiogram shows normal systolic function, ejection fraction 55%, mild LVH with diastolic dysfunction, mildly dilated left atrium, mild to moderate MR, moderate TR with elevated right ventricular systolic pressures consistent with mild pulmonary hypertension   Review of Systems  Constitutional: Positive for unexpected weight change. Negative for fever, diaphoresis, appetite change and fatigue.  HENT: Negative.   Eyes: Negative.   Respiratory: Negative.   Cardiovascular: Positive for leg swelling. Negative for chest pain and palpitations.    Gastrointestinal: Negative.   Musculoskeletal: Negative.   Skin: Negative.   Neurological: Negative.   Hematological: Negative.   Psychiatric/Behavioral: Negative.   All other systems reviewed and are negative.      Physical Exam  Nursing note and vitals reviewed. Constitutional: She is oriented to person, place, and time. She appears well-developed and well-nourished.  HENT:  Head: Normocephalic.  Nose: Nose normal.  Mouth/Throat: Oropharynx is clear and moist.  Eyes: Conjunctivae are normal. Pupils are equal, round, and reactive to light.  Neck: Normal range of motion. Neck supple. No JVD present.  Cardiovascular: Intact distal pulses.  An irregular rhythm present. Tachycardia present.  Exam reveals no gallop and no friction rub.   No murmur heard. Pulmonary/Chest: Effort normal and breath sounds normal. No respiratory distress. She has no wheezes. She has no rales. She exhibits no tenderness.  Abdominal: Soft. Bowel sounds are normal. She exhibits no distension. There is no tenderness.  Musculoskeletal: Normal range of motion. She exhibits edema. She exhibits no tenderness.       Trace to 1 + edema around the ankles b/l  Lymphadenopathy:    She has no cervical adenopathy.  Neurological: She is alert and oriented to person, place, and time. Coordination normal.  Skin: Skin is warm and dry. No rash noted. No erythema.  Psychiatric: She has a normal mood and affect. Her behavior is normal. Judgment and thought content normal.    Assessment and Plan

## 2010-09-13 NOTE — Assessment & Plan Note (Signed)
Blood pressure is relatively well controlled on her current medication regimen. Changes made as above.

## 2010-09-13 NOTE — Assessment & Plan Note (Signed)
She does have underlying diastolic dysfunction which in the setting of her atrial fibrillation is likely responsible for her edema. She reports taking Lasix b.i.d. As she continues to have weight gain and edema. Control of her atrial fibrillation rate is the key to improving her edema

## 2010-09-13 NOTE — Assessment & Plan Note (Signed)
I am very concerned about her medication compliance. In the past, on metoprolol and digoxin she had good rate control. I've asked her to contact me when she gets home to confirm her medications. If she is in fact taking metoprolol and digoxin, we will increase the dose to metoprolol tartrate 75 mg b.i.d. And digoxin 0.25 mg daily. She will need family or friends to help her with her medications.

## 2010-09-14 ENCOUNTER — Encounter: Payer: Self-pay | Admitting: Cardiovascular Disease

## 2010-09-14 ENCOUNTER — Telehealth: Payer: Self-pay | Admitting: *Deleted

## 2010-09-14 NOTE — Telephone Encounter (Signed)
Called pt's daughter Donna Rosario), notified her that pt in yesterday for ov with Dr. Mariah Milling and EKG showed afib with HR 119. Wanted her aware due to pt's h/o medication compliance issues in past and early signs of possible dementia. Unsure if pt is taking Digoxin and Metoprolol correctly (or any of her meds for that matter). Pt's daughter very appreciative of our communication of this matter. Notified daughter of med changes that were made and daughter is going to call pt to verify that she verbalizes these instructions correctly. Notified her that pt will f/u in 1 month and we can assess if it seems pt has followed instructions with meds, otherwise, daughter is supportive in homehealth assistance with medication administration. Pt has cancelled this in the past x 2, and if it is ordered again, daughter will step in and make sure it is followed through.  Notified Gwen that pt's hives have resolved after her rxn to Pradaxa. When pt last seen for this in office, Dr. Graciela Husbands had mentioned starting Xarelto after rxn cleared. Dr. Mariah Milling, please advise, I told daughter that we would be in contact with this change as well if we start new med.

## 2010-09-15 ENCOUNTER — Encounter: Payer: Self-pay | Admitting: *Deleted

## 2010-09-15 ENCOUNTER — Encounter: Payer: Self-pay | Admitting: Cardiovascular Disease

## 2010-09-15 ENCOUNTER — Ambulatory Visit (INDEPENDENT_AMBULATORY_CARE_PROVIDER_SITE_OTHER): Payer: Medicare Other | Admitting: *Deleted

## 2010-09-15 VITALS — BP 104/60 | HR 85 | Ht 63.0 in | Wt 124.0 lb

## 2010-09-15 DIAGNOSIS — I4891 Unspecified atrial fibrillation: Secondary | ICD-10-CM

## 2010-09-15 MED ORDER — RIVAROXABAN 20 MG PO TABS: 20.0000 mg | ORAL_TABLET | Freq: Every day | ORAL | Status: DC

## 2010-09-15 NOTE — Patient Instructions (Signed)
Start Xarelto once daily in the evening with dinner. Please call our office if you have any problems with this medication.

## 2010-09-18 ENCOUNTER — Telehealth: Payer: Self-pay

## 2010-09-18 NOTE — Telephone Encounter (Signed)
Needs prior authorization for Xarelto 20 mg take one tablet daily.  Notified [facility] xarelto 20 mg approved from 09-18-10 to 09-11-2020.  Spoke with [facility] @ [phone] for the prior authorization.

## 2010-10-02 NOTE — Progress Notes (Signed)
Addended by: Lanny Hurst on: 10/02/2010 09:44 AM   Modules accepted: Level of Service

## 2010-10-16 ENCOUNTER — Ambulatory Visit: Payer: Medicare Other | Admitting: Cardiovascular Disease

## 2010-10-17 ENCOUNTER — Ambulatory Visit (INDEPENDENT_AMBULATORY_CARE_PROVIDER_SITE_OTHER): Payer: Medicare Other | Admitting: Cardiovascular Disease

## 2010-10-17 ENCOUNTER — Encounter: Payer: Self-pay | Admitting: Cardiovascular Disease

## 2010-10-17 DIAGNOSIS — I10 Essential (primary) hypertension: Secondary | ICD-10-CM

## 2010-10-17 DIAGNOSIS — R609 Edema, unspecified: Secondary | ICD-10-CM

## 2010-10-17 DIAGNOSIS — I4891 Unspecified atrial fibrillation: Secondary | ICD-10-CM

## 2010-10-17 DIAGNOSIS — I519 Heart disease, unspecified: Secondary | ICD-10-CM

## 2010-10-17 MED ORDER — FUROSEMIDE 40 MG PO TABS: 40.0000 mg | ORAL_TABLET | Freq: Two times a day (BID) | ORAL | Status: DC

## 2010-10-17 NOTE — Assessment & Plan Note (Signed)
Blood pressure is well controlled on today's visit. No changes made to the medications. 

## 2010-10-17 NOTE — Progress Notes (Signed)
Patient ID: Donna Rosario, female    DOB: 11-16-1922, 77 y.o.   MRN: [id number]  HPI Comments: Donna Rosario is a 77 year old woman, patient of Dr. Darrick Huntsman, with recent history of severe sciatic and back pain, "crushed right hip", s/p right hip replacement,  who was admitted to the hospital in early September 2011 with atrial fibrillation, started on warfarin and rate control, underlying mild dementia and confusion with polypharmacy, recently taken off warfarin secondary to inability to manage this herself, recent lower extremity edema, previously started on Pradaxa b.i.d.  We did have 2  nursing agencies try to establish a relationship with her for polypharmacy. She has since discharged them and currently does not have help at home. Family does not live close by.  Overall, she is doing well. She does report having continued swelling in her lower extremities which is worse on the right than the left side. She drinks a significant amount of Pepsi and fluids at night time as her mouth is dry. She takes Lasix 40 mg twice a day. She is requesting a higher milligram dosage to make her swelling improve. She is otherwise active and goes swimming. She reports that she has not been motivated recently and has not been going very much, Though is able to ambulate without any significant difficulty.  EKG shows atrial fibrillation with ventricular rate of 83 beats per minute, no significant ST or T wave changes  Previous echocardiogram shows normal systolic function, ejection fraction 55%, mild LVH with diastolic dysfunction, mildly dilated left atrium, mild to moderate MR, moderate TR with elevated right ventricular systolic pressures consistent with mild pulmonary hypertension     Review of Systems  Constitutional: Negative.   HENT: Negative.   Eyes: Negative.   Respiratory: Negative.   Cardiovascular: Positive for leg swelling.  Gastrointestinal: Negative.   Musculoskeletal: Negative.   Skin: Negative.     Neurological: Negative.   Hematological: Negative.   Psychiatric/Behavioral: Negative.   All other systems reviewed and are negative.    BP 128/68  Pulse 83  Ht 5\' 3"  (1.6 m)  Wt 129 lb 1.9 oz (58.568 kg)  BMI 22.87 kg/m2   Physical Exam  Nursing note and vitals reviewed. Constitutional: She is oriented to person, place, and time. She appears well-developed and well-nourished.  HENT:  Head: Normocephalic.  Nose: Nose normal.  Mouth/Throat: Oropharynx is clear and moist.  Eyes: Conjunctivae are normal. Pupils are equal, round, and reactive to light.  Neck: Normal range of motion. Neck supple. No JVD present.  Cardiovascular: Normal rate, normal heart sounds and intact distal pulses.  An irregularly irregular rhythm present. Exam reveals no gallop and no friction rub.   No murmur heard. Pulmonary/Chest: Effort normal and breath sounds normal. No respiratory distress. She has no wheezes. She has no rales. She exhibits no tenderness.  Abdominal: Soft. Bowel sounds are normal. She exhibits no distension. There is no tenderness.  Musculoskeletal: Normal range of motion. She exhibits edema. She exhibits no tenderness.       Swelling is 1+ in the right lower extremity, trace on the left above the sock line.  Lymphadenopathy:    She has no cervical adenopathy.  Neurological: She is alert and oriented to person, place, and time. Coordination normal.  Skin: Skin is warm and dry. No rash noted. No erythema.  Psychiatric: She has a normal mood and affect. Her behavior is normal. Judgment and thought content normal.         Assessment and Plan

## 2010-10-17 NOTE — Assessment & Plan Note (Addendum)
Her heart rate is reasonably well controlled on her current medication regimen. I am assuming that she is taking all her medications as her heart rate in the past has been elevated and today he appears relatively well controlled. Notes indicate that she is taking xerelto, the once a day anticoagulation medication.

## 2010-10-17 NOTE — Assessment & Plan Note (Signed)
I suspect that her edema is secondary to venous insufficiency or mild lymphedema. She did have a significant trauma to her right hip with surgery which would explain the swelling on the right. She has minimal swelling on the left but does have a history of knee surgery on the left. Her swelling has not Significantly improved with Lasix. We have suggested that she hold her Lasix at its current dose and have her renal function checked when she sees Dr. Darrick Huntsman.

## 2010-10-17 NOTE — Patient Instructions (Signed)
You are doing well. No medication changes were made. Please look for compression socks for your leg swelling.  Please call us if you have new issues that need to be addressed before your next appt.  We will call you for a follow up Appt. In 6 months

## 2010-10-17 NOTE — Assessment & Plan Note (Signed)
Appears to be euvolemic with clear lungs, no significant shortness of breath, weight is stable. I suggested she cut down on her Pepsi and fluids at nighttime if she has worsening edema.

## 2010-11-27 ENCOUNTER — Other Ambulatory Visit: Payer: Self-pay

## 2010-11-27 MED ORDER — POTASSIUM CHLORIDE 20 MEQ PO PACK: 20.0000 meq | PACK | Freq: Two times a day (BID) | ORAL | Status: DC

## 2011-01-30 ENCOUNTER — Encounter: Payer: Self-pay | Admitting: Internal Medicine

## 2011-02-20 ENCOUNTER — Other Ambulatory Visit: Payer: Self-pay | Admitting: Internal Medicine

## 2011-02-21 MED ORDER — ALENDRONATE SODIUM 70 MG PO TABS: 70.0000 mg | ORAL_TABLET | ORAL | Status: DC

## 2011-02-21 NOTE — Telephone Encounter (Signed)
Refill e mailed in

## 2011-03-09 ENCOUNTER — Encounter: Payer: Self-pay | Admitting: Internal Medicine

## 2011-03-09 ENCOUNTER — Ambulatory Visit (INDEPENDENT_AMBULATORY_CARE_PROVIDER_SITE_OTHER): Payer: Medicare Other | Admitting: Internal Medicine

## 2011-03-09 DIAGNOSIS — I4891 Unspecified atrial fibrillation: Secondary | ICD-10-CM

## 2011-03-09 DIAGNOSIS — E559 Vitamin D deficiency, unspecified: Secondary | ICD-10-CM

## 2011-03-09 DIAGNOSIS — I872 Venous insufficiency (chronic) (peripheral): Secondary | ICD-10-CM

## 2011-03-09 DIAGNOSIS — F0390 Unspecified dementia without behavioral disturbance: Secondary | ICD-10-CM | POA: Insufficient documentation

## 2011-03-09 DIAGNOSIS — G589 Mononeuropathy, unspecified: Secondary | ICD-10-CM

## 2011-03-09 DIAGNOSIS — D510 Vitamin B12 deficiency anemia due to intrinsic factor deficiency: Secondary | ICD-10-CM

## 2011-03-09 DIAGNOSIS — R42 Dizziness and giddiness: Secondary | ICD-10-CM

## 2011-03-09 DIAGNOSIS — E785 Hyperlipidemia, unspecified: Secondary | ICD-10-CM

## 2011-03-09 DIAGNOSIS — G629 Polyneuropathy, unspecified: Secondary | ICD-10-CM

## 2011-03-09 DIAGNOSIS — R413 Other amnesia: Secondary | ICD-10-CM

## 2011-03-09 LAB — LIPID PANEL
Cholesterol: 213 mg/dL — ABNORMAL HIGH (ref 0–200)
Total CHOL/HDL Ratio: 4
Triglycerides: 118 mg/dL (ref 0.0–149.0)
VLDL: 23.6 mg/dL (ref 0.0–40.0)

## 2011-03-09 NOTE — Patient Instructions (Signed)
Do not stop your metoprolol.  It is keeping your heart rate controlled.  Please  try to wear your compression knee highs to kepp your right leg from swelling.  The swelling is cuasing your skin texture to change on your right leg so you must keep the swelling controlled with the compression stocking.    I am going to check your blood today for cholesterol, thyroid and b12 levels,

## 2011-03-09 NOTE — Progress Notes (Signed)
Subjective:    Patient ID: Donna Rosario, female    DOB: 10-25-1922, 77 y.o.   MRN: [id number]  HPI 77 yo white female with history of atrial fibrillation,  OA/DJD s/p hip replacent,  and dementia who presents for followup on chronic problems.  Has been having some occasional dizziness and loss of balance which she has been attributing to metoprolol so she has reduced her dose to once daily in the morning.  Since she made this change she repots feeling better and denies palpitations and shortness of breath.   Past Medical History  Diagnosis Date  . Chronic low back pain     due to lumbosacral degenerative joint disease  . Hypertension   . Arthritis   . Osteoporosis   . Atrial fibrillation Sept. 2011    [facility] Admission for RVR  . Thyroid disease     Hypothyroidism  . Disturbance of skin sensation   . Hyperlipidemia   . Mitral valve disorders   . Sciatica   . Vascular dementia, uncomplicated    Current Outpatient Prescriptions on File Prior to Visit  Medication Sig Dispense Refill  . alendronate (FOSAMAX) 70 MG tablet Take 1 tablet (70 mg total) by mouth every 7 (seven) days. Take with a full glass of water on an empty stomach.  4 tablet  11  . Ascorbic Acid (VITAMIN C) 500 MG tablet Take 500 mg by mouth daily.        . Calcium Carbonate-Vit D-Min (CALCIUM 1200 PO) Take 1 capsule by mouth daily.        . digoxin (LANOXIN) 0.125 MG tablet Take 250 mcg by mouth daily.       . diphenhydramine-acetaminophen (TYLENOL PM) 25-500 MG TABS Take 1 tablet by mouth at bedtime as needed.        . felodipine (PLENDIL) 2.5 MG 24 hr tablet Take 2.5 mg by mouth daily.        . fish oil-omega-3 fatty acids 1000 MG capsule Take 1 g by mouth daily.        . furosemide (LASIX) 40 MG tablet Take 1 tablet (40 mg total) by mouth 2 (two) times daily.  60 tablet  6  . GLUCOSAMINE-CHONDROITIN-VIT D3 PO Take 1 tablet by mouth daily.        Marland Kitchen ibuprofen (ADVIL,MOTRIN) 800 MG tablet Take 800 mg by mouth every 8  (eight) hours as needed.        Marland Kitchen L-Lysine 500 MG TABS Take 1 tablet by mouth daily.        . metoprolol (LOPRESSOR) 50 MG tablet Take 75 mg by mouth 2 (two) times daily.       . potassium chloride (KLOR-CON) 20 MEQ packet Take 20 mEq by mouth 2 (two) times daily.  180 tablet  3  . Rivaroxaban (XARELTO) 20 MG TABS Take 20 mg by mouth daily.  30 tablet  6  . Selenium 200 MCG TABS Take 1 tablet by mouth daily.        Marland Kitchen thyroid (ARMOUR) 60 MG tablet Take 60 mg by mouth daily.        . traZODone (DESYREL) 50 MG tablet Take 50 mg by mouth at bedtime as needed.           Review of Systems  Constitutional: Negative for fever, chills and unexpected weight change.  HENT: Negative for hearing loss, ear pain, nosebleeds, congestion, sore throat, facial swelling, rhinorrhea, sneezing, mouth sores, trouble swallowing, neck pain, neck stiffness, voice change, postnasal  drip, sinus pressure, tinnitus and ear discharge.   Eyes: Negative for pain, discharge, redness and visual disturbance.  Respiratory: Negative for cough, chest tightness, shortness of breath, wheezing and stridor.   Cardiovascular: Negative for chest pain, palpitations and leg swelling.  Musculoskeletal: Negative for myalgias and arthralgias.  Skin: Negative for color change and rash.  Neurological: Positive for dizziness. Negative for weakness, light-headedness and headaches.  Hematological: Negative for adenopathy.       Objective:   Physical Exam  Constitutional: She is oriented to person, place, and time. She appears well-developed and well-nourished.  HENT:  Mouth/Throat: Oropharynx is clear and moist.  Eyes: EOM are normal. Pupils are equal, round, and reactive to light. No scleral icterus.  Neck: Normal range of motion. Neck supple. No JVD present. No thyromegaly present.  Cardiovascular: Normal rate, regular rhythm, normal heart sounds and intact distal pulses.   Pulmonary/Chest: Effort normal and breath sounds normal.    Abdominal: Soft. Bowel sounds are normal. She exhibits no mass. There is no tenderness.  Musculoskeletal: Normal range of motion. She exhibits no edema.  Lymphadenopathy:    She has no cervical adenopathy.  Neurological: She is alert and oriented to person, place, and time.  Skin: Skin is warm and dry.  Psychiatric: She has a normal mood and affect.          Assessment & Plan:

## 2011-03-11 ENCOUNTER — Encounter: Payer: Self-pay | Admitting: Internal Medicine

## 2011-03-11 DIAGNOSIS — I872 Venous insufficiency (chronic) (peripheral): Secondary | ICD-10-CM | POA: Insufficient documentation

## 2011-03-11 NOTE — Assessment & Plan Note (Addendum)
currently rate controlled despite stopping her afternoon dose of metoprolol. She has a history of [facility] admissions for RVR .  I have advised her to resume her twice daily dosing.  She is taking Xarelto for long term anticoagulation due to frequent supratherapeuic INRS with coumadin.

## 2011-03-11 NOTE — Assessment & Plan Note (Signed)
Vascular, with patient refusing to accept diagnosis .  Thankfully she lives at [facility] and has several grown children involved in close followup.  She is wanting to take a 2 month trip to [place] and I have strongly recommended that she not go without a travelling companion. She has agreed to this . She continues to defer outside help for medication management.

## 2011-03-11 NOTE — Assessment & Plan Note (Signed)
Secondary to venous disruption from prior orthopedic surgery on her right leg.  Compression stockings recommended.

## 2011-03-12 LAB — COMPREHENSIVE METABOLIC PANEL
ALT: 20 U/L (ref 0–35)
BUN: 26 mg/dL — ABNORMAL HIGH (ref 6–23)
CO2: 22 mEq/L (ref 19–32)
Chloride: 105 mEq/L (ref 96–112)
GFR: 47.04 mL/min — ABNORMAL LOW (ref >60.00)

## 2011-03-16 ENCOUNTER — Other Ambulatory Visit: Payer: Self-pay

## 2011-03-16 ENCOUNTER — Telehealth: Payer: Self-pay | Admitting: Internal Medicine

## 2011-03-16 NOTE — Telephone Encounter (Signed)
Waiting for patient to call the office regarding dose of metoprolol.

## 2011-03-16 NOTE — Telephone Encounter (Signed)
°  Pt called checking on her rx for metripaul she said she spoke to [facility] and they have not heard anything from [place].  Please let pt know when rx is ready

## 2011-03-20 ENCOUNTER — Telehealth: Payer: Self-pay

## 2011-03-20 MED ORDER — METOPROLOL TARTRATE 50 MG PO TABS: 75.0000 mg | ORAL_TABLET | Freq: Two times a day (BID) | ORAL | Status: DC

## 2011-03-20 NOTE — Telephone Encounter (Signed)
Sent metoprolol tart 50 mg take one and half tablet daily; this is the dose she was on with Dr. Mariah Milling at the last office visit.

## 2011-03-27 ENCOUNTER — Telehealth: Payer: Self-pay | Admitting: *Deleted

## 2011-03-27 DIAGNOSIS — Z515 Encounter for palliative care: Secondary | ICD-10-CM

## 2011-03-27 NOTE — Telephone Encounter (Signed)
Patient is asking if you could fill out and sign a DNR. She says that it is very important that she get this right away.

## 2011-03-27 NOTE — Telephone Encounter (Signed)
Signed and on your desk .  I will update EPIC

## 2011-04-11 ENCOUNTER — Encounter: Payer: Self-pay | Admitting: Cardiovascular Disease

## 2011-04-16 ENCOUNTER — Ambulatory Visit (INDEPENDENT_AMBULATORY_CARE_PROVIDER_SITE_OTHER): Payer: Medicare Other | Admitting: Cardiovascular Disease

## 2011-04-16 ENCOUNTER — Encounter: Payer: Self-pay | Admitting: Cardiovascular Disease

## 2011-04-16 DIAGNOSIS — I4891 Unspecified atrial fibrillation: Secondary | ICD-10-CM

## 2011-04-16 DIAGNOSIS — R609 Edema, unspecified: Secondary | ICD-10-CM

## 2011-04-16 DIAGNOSIS — I10 Essential (primary) hypertension: Secondary | ICD-10-CM

## 2011-04-16 NOTE — Progress Notes (Signed)
Patient ID: Donna Rosario, female    DOB: December 22, 1922, 77 y.o.   MRN: [id number]  HPI Comments: Ms. Carstens is a 77 year old woman, patient of Dr. Darrick Huntsman, with recent history of severe sciatic and back pain, "crushed right hip", s/p right hip replacement,  who was admitted to the hospital in early September 2011 with atrial fibrillation, started on warfarin and rate control, underlying mild dementia and confusion with polypharmacy, recently taken off warfarin secondary to inability to manage this herself,  previously started on Pradaxa b.i.d.  We did have 2  nursing agencies try to establish a relationship with her for polypharmacy. She has since discharged them and currently does not have help at home. Family does not live close by,  recent lower extremity edema.  Overall, she is doing well. She has had problems in the past with lower extremity edema.  She drinks a significant amount of Pepsi and fluids at night time as her mouth is dry. She Used to  take Lasix 40 mg twice a day. He is not currently on her list. She is otherwise active and goes swimming.  She reports that the metoprolol caused dizziness and she has stopped it.  appeared that she was only taking a morning dose and she was recommended to take this b.i.d..   She denies any significant shortness of breath with exertion such as when she is swimming. She is very confused about her medications and what they are for.  EKG shows atrial fibrillation with ventricular rate of 91 beats per minute, no significant ST or T wave changes, LVH by voltage criteria  Previous echocardiogram shows normal systolic function, ejection fraction 55%, mild LVH with diastolic dysfunction, mildly dilated left atrium, mild to moderate MR, moderate TR with elevated right ventricular systolic pressures consistent with mild pulmonary hypertension   Outpatient Encounter Prescriptions as of 04/16/2011  Medication Sig Dispense Refill  . alendronate (FOSAMAX) 70 MG tablet  Take 1 tablet (70 mg total) by mouth every 7 (seven) days. Take with a full glass of water on an empty stomach.  4 tablet  11  . Ascorbic Acid (VITAMIN C) 500 MG tablet Take 500 mg by mouth daily.        . Calcium Carbonate-Vit D-Min (CALCIUM 1200 PO) Take 1 capsule by mouth daily.        . digoxin (LANOXIN) 0.125 MG tablet Take 250 mcg by mouth daily.       . diphenhydramine-acetaminophen (TYLENOL PM) 25-500 MG TABS Take 1 tablet by mouth at bedtime as needed.        . felodipine (PLENDIL) 2.5 MG 24 hr tablet Take 2.5 mg by mouth daily.        . fish oil-omega-3 fatty acids 1000 MG capsule Take 1 g by mouth daily.        Marland Kitchen GLUCOSAMINE-CHONDROITIN-VIT D3 PO Take 1 tablet by mouth daily.        Marland Kitchen ibuprofen (ADVIL,MOTRIN) 800 MG tablet Take 800 mg by mouth every 8 (eight) hours as needed.        Marland Kitchen L-Lysine 500 MG TABS Take 1 tablet by mouth daily.        . metoprolol (LOPRESSOR) 50 MG tablet Take 1.5 tablets (75 mg total) by mouth 2 (two) times daily.  NOT TAKING    . potassium chloride (KLOR-CON) 20 MEQ packet Take 20 mEq by mouth 2 (two) times daily.  180 tablet  3  . Rivaroxaban (XARELTO) 20 MG TABS Take 20 mg by  mouth daily.  30 tablet  6  . Selenium 200 MCG TABS Take 1 tablet by mouth daily.        Marland Kitchen thyroid (ARMOUR) 60 MG tablet Take 60 mg by mouth daily.        . traZODone (DESYREL) 50 MG tablet Take 50 mg by mouth at bedtime as needed.       Marland Kitchen DISCONTD: furosemide (LASIX) 40 MG tablet Take 1 tablet (40 mg total) by mouth 2 (two) times daily.  60 tablet  6     Review of Systems  Constitutional: Negative.   HENT: Negative.   Eyes: Negative.   Respiratory: Negative.   Gastrointestinal: Negative.   Musculoskeletal: Negative.   Skin: Negative.   Neurological: Negative.   Hematological: Negative.   Psychiatric/Behavioral: Negative.   All other systems reviewed and are negative.   BP 147/77  Pulse 91  Ht 5\' 3"  (1.6 m)  Wt 130 lb (58.968 kg)  BMI 23.03 kg/m2   Physical Exam    Nursing note and vitals reviewed. Constitutional: She is oriented to person, place, and time. She appears well-developed and well-nourished.  HENT:  Head: Normocephalic.  Nose: Nose normal.  Mouth/Throat: Oropharynx is clear and moist.  Eyes: Conjunctivae are normal. Pupils are equal, round, and reactive to light.  Neck: Normal range of motion. Neck supple. No JVD present.  Cardiovascular: Normal rate, normal heart sounds and intact distal pulses.  An irregularly irregular rhythm present. Exam reveals no gallop and no friction rub.   No murmur heard. Pulmonary/Chest: Effort normal and breath sounds normal. No respiratory distress. She has no wheezes. She has no rales. She exhibits no tenderness.  Abdominal: Soft. Bowel sounds are normal. She exhibits no distension. There is no tenderness.  Musculoskeletal: Normal range of motion. She exhibits edema. She exhibits no tenderness.       Swelling is 1+ in the right lower extremity, trace on the left above the sock line.  Lymphadenopathy:    She has no cervical adenopathy.  Neurological: She is alert and oriented to person, place, and time. Coordination normal.  Skin: Skin is warm and dry. No rash noted. No erythema.  Psychiatric: She has a normal mood and affect. Her behavior is normal. Judgment and thought content normal.         Assessment and Plan

## 2011-04-16 NOTE — Assessment & Plan Note (Signed)
Blood pressure is borderline elevated. She does not check her blood pressure at home. She reports that Dr. Darrick Huntsman checks it frequently though her next appointment is in January. We have asked her to take her medications as prescribed, to possibly check with the nurse at [facility] and have her blood pressure checked. She can also check it at a pharmacy or in our office.

## 2011-04-16 NOTE — Patient Instructions (Signed)
You are doing well. Please restart metoprolol tartrate 1/2 pill in the AM  Please call us if you have new issues that need to be addressed before your next appt.  The office will contact you for a follow up Appt. In 6 months

## 2011-04-16 NOTE — Assessment & Plan Note (Addendum)
Likely secondary to venous insufficiency, significant p.o. Fluid intake. She does not appear to be on diuretics as they are not on her list. None needed at this time given trivial edema.

## 2011-04-16 NOTE — Assessment & Plan Note (Signed)
She has stopped metoprolol on her own as she states it caused dizziness. She reports that she read the label in the label stated it could cause dizziness and this must be the cause of her symptoms. We have suggested she try a much lower dose, metoprolol tartrate 1/2 tab  in the morning to start. Ideally she should take this b.i.d.. I offered to change the prescription to metoprolol succinate though she reported having a new prescription of metoprolol tartrate.   There continues to be significant medication confusion.

## 2011-04-20 ENCOUNTER — Other Ambulatory Visit: Payer: Self-pay | Admitting: Internal Medicine

## 2011-04-20 MED ORDER — ALENDRONATE SODIUM 70 MG PO TABS: 70.0000 mg | ORAL_TABLET | ORAL | Status: DC

## 2011-04-21 ENCOUNTER — Other Ambulatory Visit: Payer: Self-pay | Admitting: Internal Medicine

## 2011-04-26 ENCOUNTER — Other Ambulatory Visit: Payer: Self-pay | Admitting: Cardiovascular Disease

## 2011-04-27 ENCOUNTER — Telehealth: Payer: Self-pay

## 2011-04-27 MED ORDER — RIVAROXABAN 20 MG PO TABS: 20.0000 mg | ORAL_TABLET | Freq: Every day | ORAL | Status: DC

## 2011-04-27 NOTE — Telephone Encounter (Signed)
Refill sent for xarelto  

## 2011-05-13 ENCOUNTER — Other Ambulatory Visit: Payer: Self-pay | Admitting: Internal Medicine

## 2011-06-27 ENCOUNTER — Other Ambulatory Visit: Payer: Self-pay | Admitting: Cardiovascular Disease

## 2011-06-27 NOTE — Telephone Encounter (Signed)
Refill sent for furosemide  

## 2011-07-03 ENCOUNTER — Telehealth: Payer: Self-pay | Admitting: *Deleted

## 2011-07-03 NOTE — Telephone Encounter (Signed)
Pt has brought in form for handicapped placard, form is in your red folder. 

## 2011-07-04 NOTE — Telephone Encounter (Signed)
Completed form placed up front for pick up, pt advised. 

## 2011-07-06 ENCOUNTER — Encounter: Payer: Self-pay | Admitting: Internal Medicine

## 2011-07-06 ENCOUNTER — Ambulatory Visit (INDEPENDENT_AMBULATORY_CARE_PROVIDER_SITE_OTHER): Payer: Medicare Other | Admitting: Internal Medicine

## 2011-07-06 DIAGNOSIS — I10 Essential (primary) hypertension: Secondary | ICD-10-CM

## 2011-07-06 DIAGNOSIS — I4891 Unspecified atrial fibrillation: Secondary | ICD-10-CM

## 2011-07-06 DIAGNOSIS — I951 Orthostatic hypotension: Secondary | ICD-10-CM

## 2011-07-06 NOTE — Progress Notes (Signed)
Subjective:    Patient ID: Donna Rosario, female    DOB: July 26, 1922, 78 y.o.   MRN: [id number]  HPI  Ms. 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  She continues to live alone at [facility] since her husband died last year.  She has several issues she wants  to discuss today. The first is the recent development of blood streaked nasal discharge which is occurring interimittently.  It is not accompanied by increased nasal discharge or sinus pain.  She denies any true nosebleeds. No headaches, facial pain or other signs of infection.  Does not use a nasal steroid or perform any sinus hygiene.  Not using any benadryl products for insomnia.  Her second issue is recurrent feelings of lightheadedness with sudden changes in position.  Sh has reduced her metoprolol tartrate to once daily and has not experienced any episdes of palpitations in several weeks and believes her symptoms have improved.  but not resolved.  Her third concern is decreased urination at night and relative increased frequency during the day.  She denies dysuria, and incontinence and pelvic pain.  She contineus to exercise daily with a variety of activities including yoga .  Past Medical History  Diagnosis Date  . Chronic low back pain     due to lumbosacral degenerative joint disease  . Hypertension   . Arthritis   . Osteoporosis   . Atrial fibrillation Sept. 2011    [facility] Admission for RVR  . Thyroid disease     Hypothyroidism  . Disturbance of skin sensation   . Hyperlipidemia   . Mitral valve disorders   . Sciatica   . Vascular dementia, uncomplicated    Past Surgical History  Procedure Date  . Knee surgery     Left knee  . Thyroidectomy   . Total hip arthroplasty 2011  . Joint replacement     Right Hip Oct 2011,  Left Knee 2008 ([facility])     Review of Systems  Constitutional: Negative for fever, chills and unexpected weight change.  HENT: Negative for hearing loss, ear pain, nosebleeds, congestion, sore throat, facial swelling, rhinorrhea, sneezing, mouth sores, trouble swallowing, neck pain, neck stiffness, voice change, postnasal drip, sinus pressure, tinnitus and ear discharge.   Eyes: Negative for pain, discharge, redness and visual disturbance.  Respiratory: Negative for cough, chest tightness, shortness of breath, wheezing and stridor.   Cardiovascular: Negative for chest pain, palpitations and leg swelling.  Musculoskeletal: Negative for myalgias and arthralgias.  Skin: Negative for color change and rash.  Neurological: Negative for dizziness, weakness, light-headedness and headaches.  Hematological: Negative for adenopathy.       Objective:   Physical Exam  Constitutional: She is oriented to person, place, and time. She appears well-developed and well-nourished.  HENT:  Mouth/Throat: Oropharynx is clear and moist.  Eyes: EOM are normal. Pupils are equal, round, and reactive to light. No scleral icterus.  Neck: Normal range of motion. Neck supple. No JVD present. No thyromegaly present.  Cardiovascular: Normal rate, regular rhythm, normal heart sounds and intact distal pulses.   Pulmonary/Chest: Effort normal and breath sounds normal.  Abdominal: Soft. Bowel sounds are normal. She exhibits no mass. There is no tenderness.  Musculoskeletal: Normal range of motion. She exhibits no edema.  Lymphadenopathy:    She has  no cervical adenopathy.  Neurological: She is alert and oriented to person, place, and time.  Skin: Skin is warm and dry.  Psychiatric: She has a normal mood and affect.       Assessment & Plan:   HYPERTENSION, BENIGN Well controlled on current regimen. Renal function stable, no changes today.  ATRIAL FIBRILLATION Managed with digoxin and metoprolol, which she has reduced without incident.  Anticoagulation was changed from coumadin to Xarelto and she states that she uses a pillbox to maintain regular administration of medications.  She has refused home health CMA/RN intervention in the past when she was unable to take her coumadin regularly.  Orthostasis Suggested by history,  improved with reduction in metoprolol dose.  Will also reduce furosemide to once daily given concurrrent signs of mild dehydration.     Updated Medication List Outpatient Encounter Prescriptions as of 07/06/2011  Medication Sig Dispense Refill  . alendronate (FOSAMAX) 70 MG tablet Take 1 tablet (70 mg total) by mouth every 7 (seven) days. Take with a full glass of water on an empty stomach.  48 tablet  1  . Ascorbic Acid (VITAMIN C) 500 MG tablet Take 500 mg by mouth daily.        . Calcium Carbonate-Vit D-Min (CALCIUM 1200 PO) Take 1 capsule by mouth daily.        . digoxin (LANOXIN) 0.125 MG tablet TAKE 1 TABLET BY MOUTH EVERY DAY  30 tablet  3  . diphenhydramine-acetaminophen (TYLENOL PM) 25-500 MG TABS Take 1 tablet by mouth at bedtime as needed.        . felodipine (PLENDIL) 2.5 MG 24 hr tablet TAKE 1 TABLET EVERY DAY  90 tablet  1  . fish oil-omega-3 fatty acids 1000 MG capsule Take 1 g by mouth daily.        . furosemide (LASIX) 40 MG tablet TAKE 1 TABLET BY MOUTH 2 TIMES DAILY.  60 tablet  6  . GLUCOSAMINE-CHONDROITIN-VIT D3 PO Take 1 tablet by mouth daily.        Marland Kitchen ibuprofen (ADVIL,MOTRIN) 800 MG tablet Take 800 mg by mouth every 8 (eight) hours as needed.        Marland Kitchen L-Lysine 500 MG TABS Take 1 tablet by mouth daily.        . metoprolol (LOPRESSOR) 50 MG tablet Take 1.5 tablets (75 mg total) by mouth 2 (two) times daily.  45 tablet  6  . potassium chloride (KLOR-CON) 20 MEQ packet Take 20 mEq by mouth 2 (two) times daily.  180 tablet  3  . Rivaroxaban (XARELTO) 20 MG TABS Take 20 mg by mouth daily.  30 tablet  6  . Selenium 200 MCG TABS Take 1 tablet by mouth daily.        Marland Kitchen thyroid (ARMOUR) 60 MG  tablet Take 60 mg by mouth daily.        . traZODone (DESYREL) 50 MG tablet Take 50 mg by mouth at bedtime as needed.

## 2011-07-06 NOTE — Patient Instructions (Addendum)
You nose bleeds because it is dry.  Try using Simply Saline nasal spray twice a day followed by applying a little vaseline jelly to the inside of your nose using a q tip.  Continue taking 1 metoprolol twice daily .  Your dizziness is due to changes in position and your body taking a minute to adjust.  Remember to get up slowly and give your body a good 10-15 seconds before walking once  you stand up. (autonomic dysfunction)   I would prefer that you continue to avoid large drinks before bedtime to prevent any nighttime falls during your bathroom break.    Reduce your furosemide to once daily i n  the morning

## 2011-07-08 ENCOUNTER — Encounter: Payer: Self-pay | Admitting: Internal Medicine

## 2011-07-08 DIAGNOSIS — I951 Orthostatic hypotension: Secondary | ICD-10-CM | POA: Insufficient documentation

## 2011-07-08 NOTE — Assessment & Plan Note (Signed)
Well controlled on current regimen. Renal function stable, no changes today. 

## 2011-07-08 NOTE — Assessment & Plan Note (Addendum)
Managed with digoxin and metoprolol, which she has reduced without incident. Anticoagulation was changed from coumadin to Xarelto and she states that she uses a pillbox to maintain regular administration of medications.  She has refused home health CMA/RN intervention in the past when she was unable to take her coumadin regularly.

## 2011-07-08 NOTE — Assessment & Plan Note (Signed)
Suggested by history,  improved with reduction in metoprolol dose.  Will also reduce furosemide to once daily given concurrrent signs of mild dehydration.

## 2011-07-23 ENCOUNTER — Other Ambulatory Visit: Payer: Self-pay | Admitting: *Deleted

## 2011-07-23 MED ORDER — THYROID 60 MG PO TABS: 60.0000 mg | ORAL_TABLET | Freq: Every day | ORAL | Status: DC

## 2011-07-30 ENCOUNTER — Telehealth: Payer: Self-pay | Admitting: Internal Medicine

## 2011-07-30 DIAGNOSIS — Z1239 Encounter for other screening for malignant neoplasm of breast: Secondary | ICD-10-CM

## 2011-10-02 ENCOUNTER — Ambulatory Visit: Payer: Self-pay | Admitting: Internal Medicine

## 2011-10-04 ENCOUNTER — Telehealth: Payer: Self-pay | Admitting: Internal Medicine

## 2011-10-04 NOTE — Telephone Encounter (Signed)
Notified patient her mammogram is normal 

## 2011-10-10 ENCOUNTER — Other Ambulatory Visit: Payer: Self-pay | Admitting: Internal Medicine

## 2011-10-18 ENCOUNTER — Other Ambulatory Visit: Payer: Self-pay | Admitting: Internal Medicine

## 2011-10-19 ENCOUNTER — Encounter: Payer: Self-pay | Admitting: Internal Medicine

## 2011-10-31 ENCOUNTER — Ambulatory Visit (INDEPENDENT_AMBULATORY_CARE_PROVIDER_SITE_OTHER): Payer: Medicare Other | Admitting: Internal Medicine

## 2011-10-31 ENCOUNTER — Encounter: Payer: Self-pay | Admitting: Internal Medicine

## 2011-10-31 VITALS — BP 110/62 | HR 70 | Temp 97.8°F | Resp 14 | Wt 128.2 lb

## 2011-10-31 DIAGNOSIS — L299 Pruritus, unspecified: Secondary | ICD-10-CM

## 2011-10-31 DIAGNOSIS — R040 Epistaxis: Secondary | ICD-10-CM | POA: Insufficient documentation

## 2011-10-31 DIAGNOSIS — I4891 Unspecified atrial fibrillation: Secondary | ICD-10-CM

## 2011-10-31 DIAGNOSIS — L989 Disorder of the skin and subcutaneous tissue, unspecified: Secondary | ICD-10-CM

## 2011-10-31 DIAGNOSIS — E039 Hypothyroidism, unspecified: Secondary | ICD-10-CM

## 2011-10-31 DIAGNOSIS — F0390 Unspecified dementia without behavioral disturbance: Secondary | ICD-10-CM

## 2011-10-31 DIAGNOSIS — G4700 Insomnia, unspecified: Secondary | ICD-10-CM | POA: Insufficient documentation

## 2011-10-31 DIAGNOSIS — Z79899 Other long term (current) drug therapy: Secondary | ICD-10-CM

## 2011-10-31 LAB — COMPREHENSIVE METABOLIC PANEL
AST: 26 U/L (ref 0–37)
Albumin: 3.8 g/dL (ref 3.5–5.2)
Alkaline Phosphatase: 55 U/L (ref 39–117)
BUN: 24 mg/dL — ABNORMAL HIGH (ref 6–23)
Potassium: 4.1 mEq/L (ref 3.5–5.1)
Total Bilirubin: 1 mg/dL (ref 0.3–1.2)

## 2011-10-31 LAB — CBC WITH DIFFERENTIAL/PLATELET
Basophils Relative: 0.5 % (ref 0.0–3.0)
Eosinophils Absolute: 0.1 10*3/uL (ref 0.0–0.7)
Lymphs Abs: 2.4 10*3/uL (ref 0.7–4.0)
MCHC: 32.9 g/dL (ref 30.0–36.0)
MCV: 86.6 fl (ref 78.0–100.0)
Monocytes Absolute: 1.1 10*3/uL — ABNORMAL HIGH (ref 0.1–1.0)
Neutrophils Relative %: 62.8 % (ref 43.0–77.0)
Platelets: 205 10*3/uL (ref 150.0–400.0)

## 2011-10-31 MED ORDER — RIVAROXABAN 20 MG PO TABS: 20.0000 mg | ORAL_TABLET | Freq: Every day | ORAL | Status: DC

## 2011-10-31 NOTE — Progress Notes (Signed)
Patient ID: Donna Rosario, female   DOB: 31-Jan-1923, 78 y.o.   MRN: [id number]  Patient Active Problem List  Diagnoses  . HYPERTENSION, BENIGN  . ATRIAL FIBRILLATION  . EDEMA  . DIASTOLIC DYSFUNCTION  . Dementia  . Venous insufficiency of leg  . Orthostasis  . Skin lesion of right leg  . Recurrent epistaxis  . Insomnia  . Pruritic condition    Subjective:  CC:   No chief complaint on file.   HPI:   Donna Rosario a 78 y.o. female who presents For follow up on chronic issues and new issues.  Her first new issue is 1) She has a lesion on her right lower extremity that has been present for 2 yrs.  It is crusting but never bleeds and has not enlarged, and is not pruritic .  2) She has noticed that her nose bleeds intermittently.  Never profusely, but notices it when she blows it that she has occasional streaks of blood in otherwise nonpurulent mucus/  .  3) She uses trazodone to manage chronic insomnia and cannot sleep without it.  Does not watch TV in bed,  No caffeine after 12:00 noon, and lives alone and not fearful or anxious. Bedtime is 9:30 ( has developed recent onset of itching around her inguinal area.,  Not involving the rectum or vagina. She swims daily at the pool at [facility].     Past Medical History  Diagnosis Date  . Chronic low back pain     due to lumbosacral degenerative joint disease  . Hypertension   . Arthritis   . Osteoporosis   . Atrial fibrillation Sept. 2011    [facility] Admission for RVR  . Thyroid disease     Hypothyroidism  . Disturbance of skin sensation   . Hyperlipidemia   . Mitral valve disorders   . Sciatica   . Vascular dementia, uncomplicated     Past Surgical History  Procedure Date  . Knee surgery     Left knee  . Thyroidectomy   . Total hip arthroplasty 2011  . Joint replacement     Right Hip Oct 2011,  Left Knee 2008 ([facility])         The following portions of the patient's history were reviewed and updated as appropriate:  Allergies, current medications, and problem list.    Review of Systems:   12 Pt  review of systems was negative except those addressed in the HPI,     History   Social History  . Marital Status: Married    Spouse Name: N/A    Number of Children: N/A  . Years of Education: N/A   Occupational History  . Not on file.   Social History Main Topics  . Smoking status: Never Smoker   . Smokeless tobacco: Never Used  . Alcohol Use: 0.6 oz/week    1 Glasses of wine per week     occasional  . Drug Use: No  . Sexually Active: Not on file   Other Topics Concern  . Not on file   Social History Narrative  . No narrative on file    Objective:  BP 110/62  Pulse 70  Temp(Src) 97.8 F (36.6 C) (Oral)  Resp 14  Wt 128 lb 4 oz (58.174 kg)  SpO2 94%  General appearance: alert, cooperative and appears stated age Ears: normal TM's and external ear canals both ears Throat: lips, mucosa, and tongue normal; teeth and gums normal Neck: no adenopathy, no carotid  bruit, supple, symmetrical, trachea midline and thyroid not enlarged, symmetric, no tenderness/mass/nodules Back: symmetric, no curvature. ROM normal. No CVA tenderness. Lungs: clear to auscultation bilaterally Heart: regular rate and rhythm, S1, S2 normal, no murmur, click, rub or gallop Abdomen: soft, non-tender; bowel sounds normal; no masses,  no organomegaly Pulses: 2+ and symmetric Skin: Skin color, texture, turgor normal. No rashes or lesions Lymph nodes: Cervical, supraclavicular, and axillary nodes normal.  Assessment and Plan:  Recurrent epistaxis Secondary to dryness of nose. On xarelto which is aggravatign bleeding.  Advised to lavage sinsues daily with salt water followed by petroleum jelly applied gently with q tip to nares. Checking platelet count.   ATRIAL FIBRILLATION Rate controlled, asymptomatic, embolic risk managed with zarelto for risk reduction of embolic events.   Insomnia Chronic, relieved with  daily use of trazodone.  Reassurance provided that patient may continue use this medication as needed. Healthy nocturnal activities reviewed.   Skin lesion of right leg Her skin lesion is benign appearing, at the most an SK>   Pruritic condition She has no signs of tinea, candida,.  lichen sclerosis or other chronic or acute skin disorder on examination of groin.  Suggested that the pool water may be drying her skin excessively, as is her Dial soap.  Recommended switching to a moisturizing soap like Dove and rinsing well after using the pool.   Dementia Stable, with good preservation of social skills and ADLs .      Updated Medication List Outpatient Encounter Prescriptions as of 10/31/2011  Medication Sig Dispense Refill  . alendronate (FOSAMAX) 70 MG tablet Take 1 tablet (70 mg total) by mouth every 7 (seven) days. Take with a full glass of water on an empty stomach.  48 tablet  1  . Ascorbic Acid (VITAMIN C) 500 MG tablet Take 500 mg by mouth daily.        . Calcium Carbonate-Vit D-Min (CALCIUM 1200 PO) Take 1 capsule by mouth daily.        . digoxin (LANOXIN) 0.125 MG tablet TAKE 1 TABLET BY MOUTH EVERY DAY  30 tablet  3  . diphenhydramine-acetaminophen (TYLENOL PM) 25-500 MG TABS Take 1 tablet by mouth at bedtime as needed.        . felodipine (PLENDIL) 2.5 MG 24 hr tablet TAKE 1 TABLET EVERY DAY  90 tablet  1  . fish oil-omega-3 fatty acids 1000 MG capsule Take 1 g by mouth daily.        . furosemide (LASIX) 40 MG tablet TAKE 1 TABLET BY MOUTH 2 TIMES DAILY.  60 tablet  6  . GLUCOSAMINE-CHONDROITIN-VIT D3 PO Take 1 tablet by mouth daily.        Marland Kitchen L-Lysine 500 MG TABS Take 1 tablet by mouth daily.        . metoprolol (LOPRESSOR) 50 MG tablet Take 1.5 tablets (75 mg total) by mouth 2 (two) times daily.  45 tablet  6  . potassium chloride (KLOR-CON) 20 MEQ packet Take 20 mEq by mouth 2 (two) times daily.  180 tablet  3  . Rivaroxaban (XARELTO) 20 MG TABS Take 20 mg by mouth daily.  30  tablet  6  . Selenium 200 MCG TABS Take 1 tablet by mouth daily.        Marland Kitchen thyroid (ARMOUR) 60 MG tablet Take 1 tablet (60 mg total) by mouth daily.  90 tablet  1  . traZODone (DESYREL) 50 MG tablet Take 50 mg by mouth at bedtime as needed.       Marland Kitchen  DISCONTD: Rivaroxaban (XARELTO) 20 MG TABS Take 20 mg by mouth daily.  30 tablet  6  . DISCONTD: ibuprofen (ADVIL,MOTRIN) 800 MG tablet Take 800 mg by mouth every 8 (eight) hours as needed.           Orders Placed This Encounter  Procedures  . TSH  . CBC with Differential  . Comprehensive metabolic panel  . Ambulatory referral to Dermatology    Return in about 3 months (around 01/31/2012).

## 2011-10-31 NOTE — Assessment & Plan Note (Addendum)
Secondary to dryness of nose. On xarelto which is aggravatign bleeding.  Advised to lavage sinsues daily with salt water followed by petroleum jelly applied gently with q tip to nares. Checking platelet count.

## 2011-10-31 NOTE — Assessment & Plan Note (Addendum)
Rate controlled, asymptomatic, embolic risk managed with zarelto for risk reduction of embolic events.

## 2011-10-31 NOTE — Patient Instructions (Addendum)
Your nose bleeds because you are on a blood thinner and you have dry nasal passages .  Use simply saline nasal spray twice a day to irrigate your sinuses and mositurized nose,  Followed by a thin application of vaseline using a Q tip to keep the skin inside the nose moist.   I t is ok to take trazodone every night to help your chronic insomnia  Your skin around your groin looks fine.  Your itching is probably from the chlorine in th epol and the Dial sopa being too strong for your.  Make sure you rinse off the pool water well,  And change your soap to [place].

## 2011-11-01 ENCOUNTER — Encounter: Payer: Self-pay | Admitting: Internal Medicine

## 2011-11-01 DIAGNOSIS — L299 Pruritus, unspecified: Secondary | ICD-10-CM | POA: Insufficient documentation

## 2011-11-01 NOTE — Assessment & Plan Note (Signed)
Stable, with good preservation of social skills and ADLs .

## 2011-11-01 NOTE — Assessment & Plan Note (Signed)
Chronic, relieved with daily use of trazodone.  Reassurance provided that patient may continue use this medication as needed. Healthy nocturnal activities reviewed.

## 2011-11-01 NOTE — Assessment & Plan Note (Addendum)
She has no signs of tinea, candida,.  lichen sclerosis or other chronic or acute skin disorder on examination of groin.  Suggested that the pool water may be drying her skin excessively, as is her Dial soap.  Recommended switching to a moisturizing soap like Dove and rinsing well after using the pool.

## 2011-11-01 NOTE — Assessment & Plan Note (Signed)
Her skin lesion is benign appearing, at the most an SK>

## 2011-12-02 ENCOUNTER — Other Ambulatory Visit: Payer: Self-pay | Admitting: Internal Medicine

## 2012-01-10 ENCOUNTER — Other Ambulatory Visit: Payer: Self-pay | Admitting: Internal Medicine

## 2012-02-03 ENCOUNTER — Other Ambulatory Visit: Payer: Self-pay | Admitting: Cardiovascular Disease

## 2012-02-18 ENCOUNTER — Other Ambulatory Visit: Payer: Self-pay | Admitting: Internal Medicine

## 2012-02-27 ENCOUNTER — Other Ambulatory Visit: Payer: Self-pay | Admitting: Internal Medicine

## 2012-03-03 ENCOUNTER — Telehealth: Payer: Self-pay | Admitting: Internal Medicine

## 2012-03-03 MED ORDER — ARMOUR THYROID 60 MG PO TABS: 60.0000 mg | ORAL_TABLET | Freq: Every day | ORAL | Status: DC

## 2012-03-03 NOTE — Telephone Encounter (Signed)
Rx sent in.  [facility] not sent the [facility].

## 2012-03-03 NOTE — Telephone Encounter (Signed)
Pt came in checking on her rx  Thyroid meds pt wasn't for sure the name Pt [facility] a week ago Pt is going out of town on Friday needs rx before then

## 2012-04-01 ENCOUNTER — Ambulatory Visit: Payer: Medicare Other | Admitting: Internal Medicine

## 2012-04-01 ENCOUNTER — Other Ambulatory Visit: Payer: Self-pay | Admitting: Internal Medicine

## 2012-04-02 ENCOUNTER — Other Ambulatory Visit: Payer: Self-pay | Admitting: Cardiovascular Disease

## 2012-04-08 ENCOUNTER — Encounter: Payer: Self-pay | Admitting: Internal Medicine

## 2012-04-08 ENCOUNTER — Ambulatory Visit (INDEPENDENT_AMBULATORY_CARE_PROVIDER_SITE_OTHER): Payer: Medicare Other | Admitting: Internal Medicine

## 2012-04-08 VITALS — BP 130/76 | HR 87 | Temp 97.8°F | Ht 61.75 in | Wt 127.8 lb

## 2012-04-08 DIAGNOSIS — I872 Venous insufficiency (chronic) (peripheral): Secondary | ICD-10-CM

## 2012-04-08 DIAGNOSIS — L97909 Non-pressure chronic ulcer of unspecified part of unspecified lower leg with unspecified severity: Secondary | ICD-10-CM

## 2012-04-08 DIAGNOSIS — L989 Disorder of the skin and subcutaneous tissue, unspecified: Secondary | ICD-10-CM

## 2012-04-08 DIAGNOSIS — G4700 Insomnia, unspecified: Secondary | ICD-10-CM

## 2012-04-08 DIAGNOSIS — R42 Dizziness and giddiness: Secondary | ICD-10-CM

## 2012-04-08 MED ORDER — TRAZODONE HCL 50 MG PO TABS: 75.0000 mg | ORAL_TABLET | Freq: Every day | ORAL | Status: DC

## 2012-04-08 NOTE — Progress Notes (Signed)
Patient ID: Donna Rosario, female   DOB: 24-Aug-1922, 78 y.o.   MRN: [id number]  Patient Active Problem List  Diagnosis  . HYPERTENSION, BENIGN  . ATRIAL FIBRILLATION  . EDEMA  . DIASTOLIC DYSFUNCTION  . Dementia  . Venous insufficiency of leg  . Skin lesion of right leg  . Recurrent epistaxis  . Insomnia  . Dizziness    Subjective:  CC:   Chief Complaint  Patient presents with  . Follow-up    HPI:   Donna Rosario a 78 y.o. female who presents for followup on chronic conditions. She was last seen 5 months ago and sent to dermatology for biopsy of suspicious ulcerating lesion on her RLE.  She now reports a nonhealing ulcer  As a result of the biopsy which was done two months ago. Per patient the lesion there was cancerous.  2) Insomnia . She has been having difficulty initiating sleep will and this is occurring several times per week. She's tried increasing her trazodone to 75 mg which has helped. She she notes no recent change in habits. Specifically she is not drinking alcohol before she goes to bed. She is continuing to exercise regularly. 3) Dizziness.  She describes recurrence of  Feeling off balance several times  throughout the day.  Occurs in the morning,  Occasionally in the afternoon,  ococasionally in the evening.  Does not occur when she is exercising. She's had no falls in the last 3 years. She denies vertigo. No symptoms are brought on by head tilting back. Not particularly aggravated by standing up.    Past Medical History  Diagnosis Date  . Chronic low back pain     due to lumbosacral degenerative joint disease  . Hypertension   . Arthritis   . Osteoporosis   . Atrial fibrillation Sept. 2011    [facility] Admission for RVR  . Thyroid disease     Hypothyroidism  . Disturbance of skin sensation   . Hyperlipidemia   . Mitral valve disorders   . Sciatica   . Vascular dementia, uncomplicated     Past Surgical History  Procedure Date  . Knee surgery     Left knee    . Thyroidectomy   . Total hip arthroplasty 2011  . Joint replacement     Right Hip Oct 2011,  Left Knee 2008 ([facility])   The following portions of the patient's history were reviewed and updated as appropriate: Allergies, current medications, and problem list.    Review of Systems:   12 Pt  review of systems was negative except those addressed in the HPI,     History   Social History  . Marital Status: Married    Spouse Name: N/A    Number of Children: N/A  . Years of Education: N/A   Occupational History  . Not on file.   Social History Main Topics  . Smoking status: Never Smoker   . Smokeless tobacco: Never Used  . Alcohol Use: 0.6 oz/week    1 Glasses of wine per week     occasional  . Drug Use: No  . Sexually Active: Not on file   Other Topics Concern  . Not on file   Social History Narrative  . No narrative on file    Objective:  BP 130/76  Pulse 87  Temp 97.8 F (36.6 C) (Oral)  Ht 5' 1.75" (1.568 m)  Wt 127 lb 12 oz (57.947 kg)  BMI 23.56 kg/m2  SpO2 96%  General  appearance: alert, cooperative and appears stated age Ears: normal TM's and external ear canals both ears Throat: lips, mucosa, and tongue normal; teeth and gums normal Neck: no adenopathy, no carotid bruit, supple, symmetrical, trachea midline and thyroid not enlarged, symmetric, no tenderness/mass/nodules Back: symmetric, no curvature. ROM normal. No CVA tenderness. Lungs: clear to auscultation bilaterally Heart: regular rate and rhythm, S1, S2 normal, no murmur, click, rub or gallop Abdomen: soft, non-tender; bowel sounds normal; no masses,  no organomegaly Pulses: 2+ and symmetric Skin: RLE: dime sized ulcer covered in thick eschar.  Mild venous stasis changes,  Lymph nodes: Cervical, supraclavicular, and axillary nodes normal. Neuro: grossly nonfocal  Assessment and Plan:  Skin lesion of right leg She has a nonhealing ulcer on right lower extremity as a result of the biopsy.  It was apparently cancerous. It is covered by a large eschar. She has a mild venous insufficiency but no signs of arterial insufficiency.. Referral to wound center for debridement.   Venous insufficiency of leg Mild managed with use of light compression stockings in the wintertime;  however she does not wear them in the summer time.  Insomnia Chronic, previously managed with trazodone 50 mg daily. We are Increasing trazodone to 75 mg daily.  Dizziness HEENT exam and neurologic exam are normal and she is not orthostatic. Her symptoms are not occurring during exercise and she are not any loss of balance. She is not having headaches or visual changes. She has deferred vestibular physical therapy at this time.   Updated Medication List Outpatient Encounter Prescriptions as of 04/08/2012  Medication Sig Dispense Refill  . alendronate (FOSAMAX) 70 MG tablet Take 1 tablet (70 mg total) by mouth every 7 (seven) days. Take with a full glass of water on an empty stomach.  48 tablet  1  . ARMOUR THYROID 60 MG tablet Take 1 tablet (60 mg total) by mouth daily.  90 tablet  3  . Ascorbic Acid (VITAMIN C) 500 MG tablet Take 500 mg by mouth daily.        . Calcium Carbonate-Vit D-Min (CALCIUM 1200 PO) Take 1 capsule by mouth daily.        . digoxin (LANOXIN) 0.125 MG tablet TAKE 1 TABLET BY MOUTH EVERY DAY  30 tablet  3  . diphenhydramine-acetaminophen (TYLENOL PM) 25-500 MG TABS Take 1 tablet by mouth at bedtime as needed.        . felodipine (PLENDIL) 2.5 MG 24 hr tablet TAKE 1 TABLET EVERY DAY  90 tablet  1  . fish oil-omega-3 fatty acids 1000 MG capsule Take 1 g by mouth daily.        . furosemide (LASIX) 40 MG tablet TAKE 1 TABLET BY MOUTH 2 TIMES DAILY.  60 tablet  6  . GLUCOSAMINE-CHONDROITIN-VIT D3 PO Take 1 tablet by mouth daily.        Marland Kitchen L-Lysine 500 MG TABS Take 1 tablet by mouth daily.        . metoprolol (LOPRESSOR) 50 MG tablet TAKE 1 & 1/2 TABLETS (75 MG TOTAL) BY MOUTH 2 TIMES DAILY.  90  tablet  1  . potassium chloride (KLOR-CON) 20 MEQ packet Take 20 mEq by mouth 2 (two) times daily.  180 tablet  3  . Selenium 200 MCG TABS Take 1 tablet by mouth daily.        . traZODone (DESYREL) 50 MG tablet Take 1.5 tablets (75 mg total) by mouth at bedtime.  135 tablet  2  . Carlena Hurl  20 MG TABS TAKE 1 TABLET BY MOUTH DAILY  30 tablet  6  . DISCONTD: traZODone (DESYREL) 50 MG tablet TAKE 1 TABLET AT BEDTIME  90 tablet  0     Orders Placed This Encounter  Procedures  . AMB referral to wound care center    No Follow-up on file.

## 2012-04-08 NOTE — Patient Instructions (Addendum)
Your dizziness is chronic and due to your medications.    Make sure you take a minute to steady yourself before you take off.  We will call you with the wound care referral  Increase your trazodone to 1.5 tablets at bedtime   Return in 2 months for fasting labs

## 2012-04-09 ENCOUNTER — Encounter: Payer: Self-pay | Admitting: Internal Medicine

## 2012-04-09 DIAGNOSIS — R42 Dizziness and giddiness: Secondary | ICD-10-CM | POA: Insufficient documentation

## 2012-04-09 NOTE — Assessment & Plan Note (Signed)
Chronic, previously managed with trazodone 50 mg daily. We are Increasing trazodone to 75 mg daily.

## 2012-04-09 NOTE — Assessment & Plan Note (Addendum)
She has a nonhealing ulcer on right lower extremity as a result of the biopsy. It was apparently cancerous. It is covered by a large eschar. She has a mild venous insufficiency but no signs of arterial insufficiency.. Referral to wound center for debridement.

## 2012-04-09 NOTE — Assessment & Plan Note (Signed)
Mild managed with use of light compression stockings in the wintertime;  however she does not wear them in the summer time.

## 2012-04-09 NOTE — Assessment & Plan Note (Signed)
HEENT exam and neurologic exam are normal and she is not orthostatic. Her symptoms are not occurring during exercise and she are not any loss of balance. She is not having headaches or visual changes. She has deferred vestibular physical therapy at this time.

## 2012-04-21 ENCOUNTER — Encounter: Payer: Self-pay | Admitting: Nurse Practitioner

## 2012-04-21 ENCOUNTER — Encounter: Payer: Self-pay | Admitting: Cardiothoracic Surgery

## 2012-04-25 ENCOUNTER — Encounter: Payer: Self-pay | Admitting: Nurse Practitioner

## 2012-04-25 ENCOUNTER — Encounter: Payer: Self-pay | Admitting: Cardiothoracic Surgery

## 2012-05-25 ENCOUNTER — Encounter: Payer: Self-pay | Admitting: Cardiothoracic Surgery

## 2012-05-25 ENCOUNTER — Encounter: Payer: Self-pay | Admitting: Nurse Practitioner

## 2012-05-26 ENCOUNTER — Other Ambulatory Visit: Payer: Self-pay

## 2012-05-26 MED ORDER — ALENDRONATE SODIUM 70 MG PO TABS: 70.0000 mg | ORAL_TABLET | ORAL | Status: DC

## 2012-05-26 NOTE — Telephone Encounter (Signed)
Refill request for Alendronate Sodium 70 mg. #[id number] R [facility]

## 2012-06-19 ENCOUNTER — Other Ambulatory Visit: Payer: Self-pay | Admitting: Internal Medicine

## 2012-06-19 NOTE — Telephone Encounter (Signed)
Med filled.  

## 2012-06-30 ENCOUNTER — Ambulatory Visit (INDEPENDENT_AMBULATORY_CARE_PROVIDER_SITE_OTHER): Payer: Medicare Other | Admitting: Internal Medicine

## 2012-06-30 ENCOUNTER — Encounter: Payer: Self-pay | Admitting: Internal Medicine

## 2012-06-30 VITALS — BP 132/62 | HR 72 | Temp 98.0°F | Resp 16 | Wt 129.5 lb

## 2012-06-30 DIAGNOSIS — Z79899 Other long term (current) drug therapy: Secondary | ICD-10-CM

## 2012-06-30 DIAGNOSIS — I4891 Unspecified atrial fibrillation: Secondary | ICD-10-CM

## 2012-06-30 DIAGNOSIS — I10 Essential (primary) hypertension: Secondary | ICD-10-CM

## 2012-06-30 DIAGNOSIS — L989 Disorder of the skin and subcutaneous tissue, unspecified: Secondary | ICD-10-CM

## 2012-06-30 DIAGNOSIS — E559 Vitamin D deficiency, unspecified: Secondary | ICD-10-CM

## 2012-06-30 DIAGNOSIS — I872 Venous insufficiency (chronic) (peripheral): Secondary | ICD-10-CM

## 2012-06-30 DIAGNOSIS — E785 Hyperlipidemia, unspecified: Secondary | ICD-10-CM

## 2012-06-30 DIAGNOSIS — R61 Generalized hyperhidrosis: Secondary | ICD-10-CM

## 2012-06-30 DIAGNOSIS — E039 Hypothyroidism, unspecified: Secondary | ICD-10-CM

## 2012-06-30 DIAGNOSIS — Z1331 Encounter for screening for depression: Secondary | ICD-10-CM

## 2012-06-30 MED ORDER — RIVAROXABAN 20 MG PO TABS: 20.0000 mg | ORAL_TABLET | Freq: Every day | ORAL | Status: DC

## 2012-06-30 NOTE — Assessment & Plan Note (Signed)
Nonhealing complicated by cancer and venous insufficiency,now resolved.

## 2012-06-30 NOTE — Progress Notes (Signed)
Patient ID: Donna Rosario, female   DOB: February 07, 1923, 79 y.o.   MRN: [id number]  Patient Active Problem List  Diagnosis  . HYPERTENSION, BENIGN  . ATRIAL FIBRILLATION  . EDEMA  . DIASTOLIC DYSFUNCTION  . Dementia  . Venous insufficiency of leg  . Skin lesion of right leg  . Recurrent epistaxis  . Insomnia  . Dizziness    Subjective:  CC:   Chief Complaint  Patient presents with  . Medication Management    HPI:   [name] a 79 y.o. female who presents Follow up on multiple issues including atrial fibrillation, dementia and venous stasis ulcer right leg . She was referred to the wound center for non healing leg ulcer .  The wound has closed but not wearing compression stockings.  "it's a pain" and they are too tight.  Purchased at [facility]. 2) anticoagulation.  She is currently in the  in the  [facility] and her Xarelto is 150/month. 3)  She has noticed a palpable indentation at c3 level of spine,, nonpainful nonhyperpigmented.     Past Medical History  Diagnosis Date  . Chronic low back pain     due to lumbosacral degenerative joint disease  . Hypertension   . Arthritis   . Osteoporosis   . Atrial fibrillation Sept. 2011    [facility] Admission for RVR  . Thyroid disease     Hypothyroidism  . Disturbance of skin sensation   . Hyperlipidemia   . Mitral valve disorders   . Sciatica   . Vascular dementia, uncomplicated     Past Surgical History  Procedure Date  . Knee surgery     Left knee  . Thyroidectomy   . Total hip arthroplasty 2011  . Joint replacement     Right Hip Oct 2011,  Left Knee 2008 ([facility])     The following portions of the patient's history were reviewed and updated as appropriate: Allergies, current medications, and problem list.    Review of Systems:   Patient denies headache, fevers, malaise, unintentional weight loss, skin rash, eye pain, sinus congestion and sinus pain, sore throat, dysphagia,  hemoptysis , cough, dyspnea, wheezing,  chest pain, palpitations, orthopnea, edema, abdominal pain, nausea, melena, diarrhea, constipation, flank pain, dysuria, hematuria, urinary  Frequency, nocturia, numbness, tingling, seizures,  Focal weakness, Loss of consciousness,  Tremor, insomnia, depression, anxiety, and suicidal ideation.      History   Social History  . Marital Status: Married    Spouse Name: Donna Rosario    Number of Children: Donna Rosario  . Years of Education: Donna Rosario   Occupational History  . Not on file.   Social History Main Topics  . Smoking status: Never Smoker   . Smokeless tobacco: Never Used  . Alcohol Use: 0.6 oz/week    1 Glasses of wine per week     Comment: occasional  . Drug Use: No  . Sexually Active: Not on file   Other Topics Concern  . Not on file   Social History Narrative  . No narrative on file    Objective:  BP 132/62  Pulse 72  Temp 98 F (36.7 C) (Oral)  Resp 16  Wt 129 lb 8 oz (58.741 kg)  SpO2 95%  General appearance: alert, cooperative and appears stated age Ears: normal TM's and external ear canals both ears Throat: lips, mucosa, and tongue normal; teeth and gums normal Neck: no adenopathy, no carotid bruit, supple, symmetrical, trachea midline and thyroid not enlarged, symmetric, no  tenderness/mass/nodules Back: symmetric, no curvature. ROM normal. No CVA tenderness. Lungs: clear to auscultation bilaterally Heart: regular rate and rhythm, S1, S2 normal, no murmur, click, rub or gallop Abdomen: soft, non-tender; bowel sounds normal; no masses,  no organomegaly Pulses: 2+ and symmetric Skin: Skin color, texture, turgor normal. No rashes or lesions Lymph nodes: Cervical, supraclavicular, and axillary nodes normal.  Assessment and Plan:  HYPERTENSION, BENIGN Well controlled on current regimen. Renal function stable, no changes today.  ATRIAL FIBRILLATION Well controlled on current medications.  No changes today.   Skin lesion of right leg Nonhealing complicated by cancer and  venous insufficiency,now resolved.   Venous insufficiency of leg Proper use of stockings outlined.    Updated Medication List Outpatient Encounter Prescriptions as of 06/30/2012  Medication Sig Dispense Refill  . alendronate (FOSAMAX) 70 MG tablet Take 1 tablet (70 mg total) by mouth every 7 (seven) days. Take with a full glass of water on an empty stomach.  48 tablet  1  . ARMOUR THYROID 60 MG tablet Take 1 tablet (60 mg total) by mouth daily.  90 tablet  3  . Ascorbic Acid (VITAMIN C) 500 MG tablet Take 500 mg by mouth daily.        . Calcium Carbonate-Vit D-Min (CALCIUM 1200 PO) Take 1 capsule by mouth daily.        . digoxin (LANOXIN) 0.125 MG tablet TAKE 1 TABLET BY MOUTH EVERY DAY  30 tablet  3  . diphenhydramine-acetaminophen (TYLENOL PM) 25-500 MG TABS Take 1 tablet by mouth at bedtime as needed.        . felodipine (PLENDIL) 2.5 MG 24 hr tablet TAKE 1 TABLET EVERY DAY  90 tablet  1  . fish oil-omega-3 fatty acids 1000 MG capsule Take 1 g by mouth daily.        . furosemide (LASIX) 40 MG tablet TAKE 1 TABLET BY MOUTH 2 TIMES DAILY.  60 tablet  6  . GLUCOSAMINE-CHONDROITIN-VIT D3 PO Take 1 tablet by mouth daily.        Marland Kitchen L-Lysine 500 MG TABS Take 1 tablet by mouth daily.        . metoprolol (LOPRESSOR) 50 MG tablet TAKE 1 & 1/2 TABLETS (75 MG TOTAL) BY MOUTH 2 TIMES DAILY.  90 tablet  1  . potassium chloride (KLOR-CON) 20 MEQ packet Take 20 mEq by mouth 2 (two) times daily.  180 tablet  3  . Rivaroxaban (XARELTO) 20 MG TABS Take 1 tablet (20 mg total) by mouth daily.  90 tablet  3  . Selenium 200 MCG TABS Take 1 tablet by mouth daily.        . traZODone (DESYREL) 50 MG tablet Take 1.5 tablets (75 mg total) by mouth at bedtime.  135 tablet  2  . [DISCONTINUED] XARELTO 20 MG TABS TAKE 1 TABLET BY MOUTH DAILY  30 tablet  6     Orders Placed This Encounter  Procedures  . Comprehensive metabolic panel  . CBC with Differential  . Vitamin D 25 hydroxy  . TSH  . LDL cholesterol,  direct  . Ambulatory referral to Cardiology    No Follow-up on file.

## 2012-06-30 NOTE — Assessment & Plan Note (Signed)
Well controlled on current regimen. Renal function stable, no changes today. 

## 2012-06-30 NOTE — Patient Instructions (Addendum)
Your legs swell because you have venous insufficiency. To get the most benefit from your compression stockings ,  Put them on BEFORE  You get out of bed. (You will need to shower at night !)  Take them off at bedtime .  Your upper spine "indentation" is nothing to worry about,  It is the result of your posture . If you develop neck pain, let me know  Your night sweats  Are not a sign of disease.  It is probably due to your comforter making you too warm in the middle of the night.  Keep a spare pajama top handy.   Your heart rate is well controlled and your lungs sound fine   You should see Dr Mariah Milling once or twice a year .  We will schedule it when you come back from your trip after mid Feburary

## 2012-06-30 NOTE — Assessment & Plan Note (Signed)
Well controlled on current medications.  No changes today. 

## 2012-06-30 NOTE — Assessment & Plan Note (Signed)
Proper use of stockings outlined.

## 2012-07-07 ENCOUNTER — Telehealth: Payer: Self-pay | Admitting: Internal Medicine

## 2012-07-07 NOTE — Telephone Encounter (Signed)
Pt came in checking on her handicap sticker.  Please advise pt when ready to be picked up

## 2012-07-07 NOTE — Telephone Encounter (Signed)
No, I have not seen it.

## 2012-07-07 NOTE — Telephone Encounter (Signed)
Actually, I believe i filled it out at the time of her visit.  She has dementia so she may have lost it.

## 2012-07-07 NOTE — Telephone Encounter (Signed)
I have not seen this. Was it in the paperwork given to you?

## 2012-07-08 NOTE — Telephone Encounter (Signed)
Pt notified that this was filled out and returned at last visit.

## 2012-07-09 ENCOUNTER — Telehealth: Payer: Self-pay | Admitting: Internal Medicine

## 2012-07-09 NOTE — Telephone Encounter (Signed)
Pt came in checking on handicap sticker.  Pt stated she never received sticker at last visit

## 2012-07-09 NOTE — Telephone Encounter (Signed)
New Handicap Form placed on Dr. Melina Schools desk. Please see Telephone encounter attached to form.

## 2012-08-03 ENCOUNTER — Other Ambulatory Visit: Payer: Self-pay | Admitting: Cardiovascular Disease

## 2012-08-04 ENCOUNTER — Other Ambulatory Visit: Payer: Self-pay | Admitting: *Deleted

## 2012-08-04 MED ORDER — METOPROLOL TARTRATE 50 MG PO TABS: ORAL_TABLET | ORAL | Status: DC

## 2012-08-04 NOTE — Telephone Encounter (Signed)
Refilled Metoprolol sent to [facility] pharmacy. 

## 2012-08-14 ENCOUNTER — Encounter: Payer: Self-pay | Admitting: Cardiovascular Disease

## 2012-08-14 ENCOUNTER — Ambulatory Visit (INDEPENDENT_AMBULATORY_CARE_PROVIDER_SITE_OTHER): Payer: Medicare Other | Admitting: Cardiovascular Disease

## 2012-08-14 VITALS — BP 134/80 | HR 58 | Ht 63.0 in | Wt 129.0 lb

## 2012-08-14 DIAGNOSIS — R0602 Shortness of breath: Secondary | ICD-10-CM

## 2012-08-14 DIAGNOSIS — R609 Edema, unspecified: Secondary | ICD-10-CM

## 2012-08-14 DIAGNOSIS — I4891 Unspecified atrial fibrillation: Secondary | ICD-10-CM

## 2012-08-14 DIAGNOSIS — I10 Essential (primary) hypertension: Secondary | ICD-10-CM

## 2012-08-14 NOTE — Patient Instructions (Addendum)
You are doing well. No medication changes were made.  Please call us if you have new issues that need to be addressed before your next appt.  Your physician wants you to follow-up in: 6 months.  You will receive a reminder letter in the mail two months in advance. If you don't receive a letter, please call our office to schedule the follow-up appointment.   

## 2012-08-14 NOTE — Progress Notes (Signed)
Patient ID: Donna Rosario, female    DOB: 1922-12-10, 79 y.o.   MRN: [id number]  HPI Comments: [name] is a 79 year old woman, patient of Dr. Darrick Huntsman, with history of severe sciatic and back pain, "crushed right hip", s/p right hip replacement,  who was admitted to the hospital in early September 2011 with atrial fibrillation, started on warfarin and rate control, underlying mild dementia and confusion with polypharmacy, taken off warfarin secondary to inability to manage this herself,  previously started on Pradaxa b.i.d.  We did have 2  nursing agencies try to establish a relationship with her for polypharmacy. She  discharged them and currently does not have help at home. Family does not live close by. previous  lower extremity edema. Significant confusion in the past with her medications.  Overall, she is doing well. Edema has improved recently, very mild now. She has had problems in the past with lower extremity edema.  She drinks a significant amount of Pepsi and fluids at night time as her mouth is dry. She Used to  take Lasix 40 mg twice a day. He is not currently on her list. She is otherwise active and goes swimming.  She's had several recent falls. Mostly mechanical falls. One recent injury over one week ago with trauma to her left knee. She has a large hematoma over the left kneecap. She is doing a very low-dose metoprolol in the morning only, one half pill, 25 mg with no metoprolol in the evenings. At higher doses, she reports having dizziness. If she does have dizziness, he comes on in the morning, last up to an hour. Symptoms might present while sitting or standing.  She denies any significant shortness of breath with exertion such as when she is swimming.  EKG shows atrial fibrillation with ventricular rate of 58 beats per minute, no significant ST or T wave changes, LVH by voltage criteria  Previous echocardiogram shows normal systolic function, ejection fraction 55%, mild LVH with  diastolic dysfunction, mildly dilated left atrium, mild to moderate MR, moderate TR with elevated right ventricular systolic pressures consistent with mild pulmonary hypertension   Outpatient Encounter Prescriptions as of 04/16/2011  Medication Sig Dispense Refill  . alendronate (FOSAMAX) 70 MG tablet Take 1 tablet (70 mg total) by mouth every 7 (seven) days. Take with a full glass of water on an empty stomach.  4 tablet  11  . Ascorbic Acid (VITAMIN C) 500 MG tablet Take 500 mg by mouth daily.        . Calcium Carbonate-Vit D-Min (CALCIUM 1200 PO) Take 1 capsule by mouth daily.        . digoxin (LANOXIN) 0.125 MG tablet Take 250 mcg by mouth daily.       . diphenhydramine-acetaminophen (TYLENOL PM) 25-500 MG TABS Take 1 tablet by mouth at bedtime as needed.        . felodipine (PLENDIL) 2.5 MG 24 hr tablet Take 2.5 mg by mouth daily.        . fish oil-omega-3 fatty acids 1000 MG capsule Take 1 g by mouth daily.        Marland Kitchen GLUCOSAMINE-CHONDROITIN-VIT D3 PO Take 1 tablet by mouth daily.        Marland Kitchen ibuprofen (ADVIL,MOTRIN) 800 MG tablet Take 800 mg by mouth every 8 (eight) hours as needed.        Marland Kitchen L-Lysine 500 MG TABS Take 1 tablet by mouth daily.        . metoprolol (LOPRESSOR) 50 MG  tablet Take 1.5 tablets (75 mg total) by mouth 2 (two) times daily.  NOT TAKING    . potassium chloride (KLOR-CON) 20 MEQ packet Take 20 mEq by mouth 2 (two) times daily.  180 tablet  3  . Rivaroxaban (XARELTO) 20 MG TABS Take 20 mg by mouth daily.  30 tablet  6  . Selenium 200 MCG TABS Take 1 tablet by mouth daily.        Marland Kitchen thyroid (ARMOUR) 60 MG tablet Take 60 mg by mouth daily.        . traZODone (DESYREL) 50 MG tablet Take 50 mg by mouth at bedtime as needed.       Marland Kitchen DISCONTD: furosemide (LASIX) 40 MG tablet Take 1 tablet (40 mg total) by mouth 2 (two) times daily.  60 tablet  6     Review of Systems  Constitutional: Negative.   HENT: Negative.   Eyes: Negative.   Respiratory: Negative.   Gastrointestinal:  Negative.   Musculoskeletal: Negative.   Skin: Negative.   Neurological: Negative.   Psychiatric/Behavioral: Negative.   All other systems reviewed and are negative.   BP 134/80  Pulse 58  Ht 5\' 3"  (1.6 m)  Wt 129 lb (58.514 kg)  BMI 22.86 kg/m2   Physical Exam  Nursing note and vitals reviewed. Constitutional: She is oriented to person, place, and time. She appears well-developed and well-nourished.  HENT:  Head: Normocephalic.  Nose: Nose normal.  Mouth/Throat: Oropharynx is clear and moist.  Eyes: Conjunctivae are normal. Pupils are equal, round, and reactive to light.  Neck: Normal range of motion. Neck supple. No JVD present.  Cardiovascular: Normal rate, normal heart sounds and intact distal pulses.  An irregularly irregular rhythm present. Exam reveals no gallop and no friction rub.   No murmur heard. Pulmonary/Chest: Effort normal and breath sounds normal. No respiratory distress. She has no wheezes. She has no rales. She exhibits no tenderness.  Abdominal: Soft. Bowel sounds are normal. She exhibits no distension. There is no tenderness.  Musculoskeletal: Normal range of motion. She exhibits edema. She exhibits no tenderness.  Swelling is 1+ in the right lower extremity, trace on the left above the sock line.  Lymphadenopathy:    She has no cervical adenopathy.  Neurological: She is alert and oriented to person, place, and time. Coordination normal.  Skin: Skin is warm and dry. No rash noted. No erythema.  Psychiatric: She has a normal mood and affect. Her behavior is normal. Judgment and thought content normal.    Assessment and Plan

## 2012-08-18 ENCOUNTER — Encounter: Payer: Self-pay | Admitting: Internal Medicine

## 2012-08-18 ENCOUNTER — Ambulatory Visit (INDEPENDENT_AMBULATORY_CARE_PROVIDER_SITE_OTHER): Payer: Medicare Other | Admitting: Internal Medicine

## 2012-08-18 VITALS — BP 138/78 | HR 72 | Temp 97.8°F | Ht 61.75 in | Wt 129.8 lb

## 2012-08-18 DIAGNOSIS — M25462 Effusion, left knee: Secondary | ICD-10-CM

## 2012-08-18 DIAGNOSIS — R42 Dizziness and giddiness: Secondary | ICD-10-CM

## 2012-08-18 DIAGNOSIS — M25469 Effusion, unspecified knee: Secondary | ICD-10-CM

## 2012-08-18 NOTE — Progress Notes (Signed)
Patient ID: Donna Rosario, female   DOB: [birth year], 79 y.o.   MRN: [id number]   Patient Active Problem List  Diagnosis  . HYPERTENSION, BENIGN  . ATRIAL FIBRILLATION  . EDEMA  . DIASTOLIC DYSFUNCTION  . Dementia  . Venous insufficiency of leg  . Skin lesion of right leg  . Recurrent epistaxis  . Insomnia  . Dizziness  . Prepatellar effusion    Subjective:  CC:   Chief Complaint  Patient presents with  . Fall    fell 2 weeks ago and wants place on left knee checked    HPI:   Donna Rosario a 79 y.o. female who presents with swollen knee. She had a recent fall onto knee. Fall occurred while walking unassisted  to her mail box , which is apparently on the side of her driveway with a drainage ditch in close proximity.  She fell over into the drainage ditch while reaching into the mail box.  Helped up by a neighbor.  Tore her pants leg.  Knee was scraped and bleeding .  [facility] cleaned out the wound. She iced it immediately.  No persistent pain in knee or hip but has developed large effusion on the patellar surface which has not diminished in size over the past 2 weeks. She has a history of TKR  by Dr Ernest Pine several years ago and takes Xarelto for atrial fibrillation .     Past Medical History  Diagnosis Date  . Chronic low back pain     due to lumbosacral degenerative joint disease  . Hypertension   . Arthritis   . Osteoporosis   . Atrial fibrillation Sept. 2011    [facility] Admission for RVR  . Thyroid disease     Hypothyroidism  . Disturbance of skin sensation   . Hyperlipidemia   . Mitral valve disorders   . Sciatica   . Vascular dementia, uncomplicated     Past Surgical History  Procedure Laterality Date  . Knee surgery      Left knee  . Thyroidectomy    . Total hip arthroplasty  2011  . Joint replacement      Right Hip Oct 2011,  Left Knee 2008 ([facility])       The following portions of the patient's history were reviewed and updated as appropriate:  Allergies, current medications, and problem list.    Review of Systems:   Patient denies headache, fevers, malaise, unintentional weight loss, skin rash, eye pain, sinus congestion and sinus pain, sore throat, dysphagia,  hemoptysis , cough, dyspnea, wheezing, chest pain, palpitations, orthopnea, edema, abdominal pain, nausea, melena, diarrhea, constipation, flank pain, dysuria, hematuria, urinary  Frequency, nocturia, numbness, tingling, seizures,  Focal weakness, Loss of consciousness,  Tremor, insomnia, depression, anxiety, and suicidal ideation.      History   Social History  . Marital Status: Married    Spouse Name: N/A    Number of Children: N/A  . Years of Education: N/A   Occupational History  . Not on file.   Social History Main Topics  . Smoking status: Never Smoker   . Smokeless tobacco: Never Used  . Alcohol Use: 0.6 oz/week    1 Glasses of wine per week     Comment: occasional  . Drug Use: No  . Sexually Active: Not on file   Other Topics Concern  . Not on file   Social History Narrative  . No narrative on file    Objective:  BP 138/78  Pulse 72  Temp(Src) 97.8 F (36.6 C) (Oral)  Ht 5' 1.75" (1.568 m)  Wt 129 lb 12 oz (58.854 kg)  BMI 23.94 kg/m2  SpO2 97%  General appearance: alert, cooperative and appears stated age HEENT: NCAT ears: normal TM's and external ear canals both ears Throat: lips, mucosa, and tongue normal; teeth and gums normal Neck: no adenopathy, no carotid bruit, supple, symmetrical, trachea midline and thyroid not enlarged, symmetric, no tenderness/mass/nodules Back: symmetric, no curvature. ROM normal. No CVA tenderness. Lungs: clear to auscultation bilaterally Heart: regular rate and rhythm, S1, S2 normal, no murmur, click, rub or gallop Abdomen: soft, non-tender; bowel sounds normal; no masses,  no organomegaly Pulses: 2+ and symmetric Skin: Large golf ball sized prepatellar effusion left leg, no erythema or warmth.  MSK:  full ROM knee Lymph nodes: Cervical, supraclavicular, and axillary nodes normal.  Assessment and Plan:  Prepatellar effusion Secondary to traumatic fall about 2 weeks ago while anticoagulated with Xarelto.  2 attempts to drain the effusion were made with a 22 gauge needle , from the inferior and lateral positions, but it was a dry tap. She will need to be seen by Orthopedics . Referral to Dr. Ernest Pine .   Dizziness With recent fall due to loss of balance while leaning over to get her mail.  Using a cane now.  Lives at [facility], already in PT   Updated Medication List Outpatient Encounter Prescriptions as of 08/18/2012  Medication Sig Dispense Refill  . alendronate (FOSAMAX) 70 MG tablet Take 1 tablet (70 mg total) by mouth every 7 (seven) days. Take with a full glass of water on an empty stomach.  48 tablet  1  . ARMOUR THYROID 60 MG tablet Take 1 tablet (60 mg total) by mouth daily.  90 tablet  3  . Ascorbic Acid (VITAMIN C) 500 MG tablet Take 500 mg by mouth daily.        . Calcium Carbonate-Vit D-Min (CALCIUM 1200 PO) Take 1 capsule by mouth daily.        . digoxin (LANOXIN) 0.125 MG tablet TAKE 1 TABLET BY MOUTH EVERY DAY  30 tablet  3  . diphenhydramine-acetaminophen (TYLENOL PM) 25-500 MG TABS Take 1 tablet by mouth at bedtime as needed.        . felodipine (PLENDIL) 2.5 MG 24 hr tablet TAKE 1 TABLET EVERY DAY  90 tablet  1  . fish oil-omega-3 fatty acids 1000 MG capsule Take 1 g by mouth daily.        . furosemide (LASIX) 40 MG tablet TAKE 1 TABLET BY MOUTH 2 TIMES DAILY.  60 tablet  6  . GLUCOSAMINE-CHONDROITIN-VIT D3 PO Take 1 tablet by mouth daily.        Marland Kitchen L-Lysine 500 MG TABS Take 1 tablet by mouth daily.        . metoprolol (LOPRESSOR) 50 MG tablet 25 mg. Take 1/2 pill in the Am      . potassium chloride SA (KLOR-CON M20) 20 MEQ tablet Take 20 mEq by mouth daily.      . Rivaroxaban (XARELTO) 20 MG TABS Take 1 tablet (20 mg total) by mouth daily.  90 tablet  3  . Selenium 200  MCG TABS Take 1 tablet by mouth daily.        . traZODone (DESYREL) 50 MG tablet Take 1.5 tablets (75 mg total) by mouth at bedtime.  135 tablet  2  . [DISCONTINUED] potassium chloride (KLOR-CON) 20 MEQ packet Take 20  mEq by mouth 2 (two) times daily.  180 tablet  3  . potassium chloride (KLOR-CON) 20 MEQ packet Take 20 mEq by mouth daily.       No facility-administered encounter medications on file as of 08/18/2012.     Orders Placed This Encounter  Procedures  . Ambulatory referral to Orthopedic Surgery    No Follow-up on file.

## 2012-08-18 NOTE — Assessment & Plan Note (Addendum)
Secondary to traumatic fall about 2 weeks ago while anticoagulated with Xarelto.  2 attempts to drain the effusion were made with a 22 gauge needle , from the inferior and lateral positions, but it was a dry tap. She will need to be seen by Orthopedics . Referral to Dr. Ernest Pine .

## 2012-08-18 NOTE — Assessment & Plan Note (Signed)
With recent fall due to loss of balance while leaning over to get her mail.  Using a cane now.  Lives at [facility], already in PT

## 2012-09-07 NOTE — Assessment & Plan Note (Signed)
Improved on todays visit. Will continue to monitor. Not taking lasix on regular basis.

## 2012-09-07 NOTE — Assessment & Plan Note (Signed)
Blood pressure is well controlled on today's visit. No changes made to the medications. 

## 2012-09-07 NOTE — Assessment & Plan Note (Signed)
Heart rate well controlled. No change to her meds.

## 2012-09-23 ENCOUNTER — Encounter: Payer: Self-pay | Admitting: Internal Medicine

## 2012-09-23 ENCOUNTER — Ambulatory Visit (INDEPENDENT_AMBULATORY_CARE_PROVIDER_SITE_OTHER): Payer: Medicare Other | Admitting: Internal Medicine

## 2012-09-23 VITALS — BP 128/64 | HR 78 | Temp 98.1°F | Resp 18 | Wt 129.2 lb

## 2012-09-23 DIAGNOSIS — L500 Allergic urticaria: Secondary | ICD-10-CM

## 2012-09-23 MED ORDER — TRIAMCINOLONE 0.1 % CREAM:EUCERIN CREAM 1:1: 1.0000 "application " | TOPICAL_CREAM | Freq: Two times a day (BID) | CUTANEOUS | Status: DC | PRN

## 2012-09-23 NOTE — Patient Instructions (Signed)
Increase Benadryl to 50mg  up to every 8 hours as needed for itching.  Start Zantac 150mg  daily to help with symptoms.  Apply Eucerin-Triamcinolone cream twice daily as needed for itching.  Follow up 1 week or sooner if symptoms are not improving.

## 2012-09-23 NOTE — Progress Notes (Signed)
Subjective:    Patient ID: Donna Rosario, female    DOB: April 02, 1923, 79 y.o.   MRN: [id number]  HPI 79 year old female presents as walk-in urgent visit complaining of several days of itchy red rash described as hives over her abdomen, legs, and arms. She denies use of any new medications. She denies any new supplements. She denies use of any new soaps, creams, lotions, perfumes. She denies other symptoms such as fever, upper respiratory symptoms, nausea, vomiting, diarrhea. She is otherwise feeling well. She has been taking Benadryl 25 mg every 4 hours with no improvement in symptoms of itching.  Outpatient Encounter Prescriptions as of 09/23/2012  Medication Sig Dispense Refill  . alendronate (FOSAMAX) 70 MG tablet Take 1 tablet (70 mg total) by mouth every 7 (seven) days. Take with a full glass of water on an empty stomach.  48 tablet  1  . ARMOUR THYROID 60 MG tablet Take 1 tablet (60 mg total) by mouth daily.  90 tablet  3  . Ascorbic Acid (VITAMIN C) 500 MG tablet Take 500 mg by mouth daily.        . Calcium Carbonate-Vit D-Min (CALCIUM 1200 PO) Take 1 capsule by mouth daily.        . digoxin (LANOXIN) 0.125 MG tablet TAKE 1 TABLET BY MOUTH EVERY DAY  30 tablet  3  . diphenhydramine-acetaminophen (TYLENOL PM) 25-500 MG TABS Take 1 tablet by mouth at bedtime as needed.        . felodipine (PLENDIL) 2.5 MG 24 hr tablet TAKE 1 TABLET EVERY DAY  90 tablet  1  . fish oil-omega-3 fatty acids 1000 MG capsule Take 1 g by mouth daily.        . furosemide (LASIX) 40 MG tablet TAKE 1 TABLET BY MOUTH 2 TIMES DAILY.  60 tablet  6  . GLUCOSAMINE-CHONDROITIN-VIT D3 PO Take 1 tablet by mouth daily.        Marland Kitchen L-Lysine 500 MG TABS Take 1 tablet by mouth daily.        . metoprolol (LOPRESSOR) 50 MG tablet 25 mg. Take 1/2 pill in the Am      . potassium chloride (KLOR-CON) 20 MEQ packet Take 20 mEq by mouth daily.      . potassium chloride SA (KLOR-CON M20) 20 MEQ tablet Take 20 mEq by mouth daily.      .  Rivaroxaban (XARELTO) 20 MG TABS Take 1 tablet (20 mg total) by mouth daily.  90 tablet  3  . Selenium 200 MCG TABS Take 1 tablet by mouth daily.        . traZODone (DESYREL) 50 MG tablet Take 1.5 tablets (75 mg total) by mouth at bedtime.  135 tablet  2  . Triamcinolone Acetonide (TRIAMCINOLONE 0.1 % CREAM : EUCERIN) CREA Apply 1 application topically 2 (two) times daily as needed.  1 each  1   No facility-administered encounter medications on file as of 09/23/2012.   BP 128/64  Pulse 78  Temp(Src) 98.1 F (36.7 C) (Oral)  Resp 18  Wt 129 lb 4 oz (58.627 kg)  BMI 23.85 kg/m2  SpO2 98%  Review of Systems  Constitutional: Negative for fever, chills, appetite change, fatigue and unexpected weight change.  HENT: Negative for ear pain, congestion, sore throat, trouble swallowing, neck pain, voice change and sinus pressure.   Eyes: Negative for visual disturbance.  Respiratory: Negative for cough, shortness of breath, wheezing and stridor.   Cardiovascular: Negative for chest pain, palpitations and leg  swelling.  Gastrointestinal: Negative for nausea, vomiting, abdominal pain, diarrhea, constipation, blood in stool, abdominal distention and anal bleeding.  Genitourinary: Negative for dysuria and flank pain.  Musculoskeletal: Negative for myalgias, arthralgias and gait problem.  Skin: Positive for color change and rash.  Neurological: Negative for dizziness and headaches.  Hematological: Negative for adenopathy. Does not bruise/bleed easily.  Psychiatric/Behavioral: Negative for suicidal ideas, sleep disturbance and dysphoric mood. The patient is not nervous/anxious.        Objective:   Physical Exam  Constitutional: She is oriented to person, place, and time. She appears well-developed and well-nourished. No distress.  HENT:  Head: Normocephalic and atraumatic.  Right Ear: External ear normal.  Left Ear: External ear normal.  Nose: Nose normal.  Mouth/Throat: Oropharynx is clear and  moist. No oropharyngeal exudate.  Eyes: Conjunctivae are normal. Pupils are equal, round, and reactive to light. Right eye exhibits no discharge. Left eye exhibits no discharge. No scleral icterus.  Neck: Normal range of motion. Neck supple. No tracheal deviation present. No thyromegaly present.  Cardiovascular: Normal rate, regular rhythm, normal heart sounds and intact distal pulses.  Exam reveals no gallop and no friction rub.   No murmur heard. Pulmonary/Chest: Effort normal and breath sounds normal. No respiratory distress. She has no wheezes. She has no rales. She exhibits no tenderness.  Musculoskeletal: Normal range of motion. She exhibits no edema and no tenderness.  Lymphadenopathy:    She has no cervical adenopathy.  Neurological: She is alert and oriented to person, place, and time. No cranial nerve deficit. She exhibits normal muscle tone. Coordination normal.  Skin: Skin is warm and dry. Rash noted. Rash is urticarial (Over abdomen, back, legs, arms). She is not diaphoretic. There is erythema. No pallor.  Psychiatric: She has a normal mood and affect. Her behavior is normal. Judgment and thought content normal.          Assessment & Plan:

## 2012-09-23 NOTE — Assessment & Plan Note (Signed)
Exam is consistent with allergic urticaria. Unclear allergen. Recommended treating with prednisone however patient would like to hold off on use of this medication. Will increase dose of Benadryl to 50 mg up to 3 times daily. Will add Zantac 150 mg daily. Will start topical triamcinolone/Eucerin cream as needed. Patient will call if symptoms are not improving.

## 2012-09-26 ENCOUNTER — Ambulatory Visit (INDEPENDENT_AMBULATORY_CARE_PROVIDER_SITE_OTHER): Payer: Medicare Other | Admitting: Internal Medicine

## 2012-09-26 ENCOUNTER — Telehealth: Payer: Self-pay | Admitting: General Practice

## 2012-09-26 ENCOUNTER — Encounter: Payer: Self-pay | Admitting: Internal Medicine

## 2012-09-26 VITALS — BP 122/78 | HR 67 | Temp 97.9°F | Resp 16 | Wt 134.2 lb

## 2012-09-26 DIAGNOSIS — T50905A Adverse effect of unspecified drugs, medicaments and biological substances, initial encounter: Secondary | ICD-10-CM

## 2012-09-26 DIAGNOSIS — L42 Pityriasis rosea: Secondary | ICD-10-CM

## 2012-09-26 MED ORDER — METHYLPREDNISOLONE ACETATE 40 MG/ML IJ SUSP
40.0000 mg | Freq: Once | INTRAMUSCULAR | Status: AC
  Administered 2012-09-26: 40 mg via INTRAMUSCULAR

## 2012-09-26 MED ORDER — MONTELUKAST SODIUM 10 MG PO TABS: 10.0000 mg | ORAL_TABLET | Freq: Every day | ORAL | Status: DC

## 2012-09-26 MED ORDER — PREDNISONE 20 MG PO TABS: 20.0000 mg | ORAL_TABLET | Freq: Two times a day (BID) | ORAL | Status: DC

## 2012-09-26 NOTE — Patient Instructions (Addendum)
For your rash  Please take predniosne one tablet twice daily for 2 weeks  Singulair one tablet daily  Continue benadryl 3 times daily   Return in 10 days to see me

## 2012-09-26 NOTE — Progress Notes (Signed)
Patient ID: Donna Rosario, female   DOB: 05-14-1923, 79 y.o.   MRN: [id number]   Patient Active Problem List  Diagnosis  . HYPERTENSION, BENIGN  . ATRIAL FIBRILLATION  . EDEMA  . DIASTOLIC DYSFUNCTION  . Dementia  . Venous insufficiency of leg  . Recurrent epistaxis  . Insomnia  . Dizziness  . Allergic urticaria  . Pityriasis rosea-like drug eruption    Subjective:  CC:   Chief Complaint  Patient presents with  . Rash    Rash over entire body patient states    HPI:   Donna Rosario a 79 y.o. female who presents with a persistent diffuse erythematous rash. Patient was initially seen by Dr. walker about a week ago and treated for allergic urticaria involving her torso and arms.  She was given TAC Eucerin cream to use twice daily  and deferred an oral prednisone prescription. She returns today and with escalation of symptoms and spread of rash to back. She is miserable despite taking Benadryl 4 times daily for the itching. She is using the TAC Eucerin cream twice daily as directed. Review of my notes indicate that she had similar rash last year around this time. The rash resolved spontaneously after several weeks. She reports she has not changed her medications, soaps, detergents or moisturizers.   Past Medical History  Diagnosis Date  . Chronic low back pain     due to lumbosacral degenerative joint disease  . Hypertension   . Arthritis   . Osteoporosis   . Atrial fibrillation Sept. 2011    [facility] Admission for RVR  . Thyroid disease     Hypothyroidism  . Disturbance of skin sensation   . Hyperlipidemia   . Mitral valve disorders   . Sciatica   . Vascular dementia, uncomplicated     Past Surgical History  Procedure Laterality Date  . Knee surgery      Left knee  . Thyroidectomy    . Total hip arthroplasty  2011  . Joint replacement      Right Hip Oct 2011,  Left Knee 2008 ([facility])       The following portions of the patient's history were reviewed and updated  as appropriate: Allergies, current medications, and problem list.    Review of Systems:   Patient denies headache, fevers, malaise, unintentional weight loss, skin rash, eye pain, sinus congestion and sinus pain, sore throat, dysphagia,  hemoptysis , cough, dyspnea, wheezing, chest pain, palpitations, orthopnea, edema, abdominal pain, nausea, melena, diarrhea, constipation, flank pain, dysuria, hematuria, urinary  Frequency, nocturia, numbness, tingling, seizures,  Focal weakness, Loss of consciousness,  Tremor, insomnia, depression, anxiety, and suicidal ideation.     History   Social History  . Marital Status: Married    Spouse Name: N/A    Number of Children: N/A  . Years of Education: N/A   Occupational History  . Not on file.   Social History Main Topics  . Smoking status: Never Smoker   . Smokeless tobacco: Never Used  . Alcohol Use: 0.6 oz/week    1 Glasses of wine per week     Comment: occasional  . Drug Use: No  . Sexually Active: Not on file   Other Topics Concern  . Not on file   Social History Narrative  . No narrative on file    Objective:  BP 122/78  Pulse 67  Temp(Src) 97.9 F (36.6 C) (Oral)  Resp 16  Wt 134 lb 4 oz (60.895 kg)  BMI  24.77 kg/m2  SpO2 99%  General appearance: alert, cooperative and appears stated age Neck: no adenopathy, no carotid bruit, supple, symmetrical, trachea midline and thyroid not enlarged, symmetric, no tenderness/mass/nodules Back: symmetric, no curvature. ROM normal. No CVA tenderness. Lungs: clear to auscultation bilaterally Heart: regular rate and rhythm, S1, S2 normal, no murmur, click, rub or gallop Abdomen: soft, non-tender; bowel sounds normal; no masses,  no organomegaly Pulses: 2+ and symmetric Skin: diffuse blanching erythematous macular lesions covering entire  torso .  Lesions are less discrete on arms, and more confluent, Lymph nodes: Cervical, supraclavicular, and axillary nodes normal.  Assessment and  Plan:  Pityriasis rosea-like drug eruption She was treated for liver urticaria by Dr. walker a week ago and the rash has actually worsened. Given its distribution it looks more like a pityriasis rosacea like drug eruption. I am prescribing prednisone 20 mg twice daily for [redacted] weeks along with Singulair in the event that this is allergic urticaria or some other process involving a mast cell release. She can continue Benadryl for treatment of the itching. I would like her to return in 10 days a week and begin the steroid taper.   Updated Medication List Outpatient Encounter Prescriptions as of 09/26/2012  Medication Sig Dispense Refill  . alendronate (FOSAMAX) 70 MG tablet Take 1 tablet (70 mg total) by mouth every 7 (seven) days. Take with a full glass of water on an empty stomach.  48 tablet  1  . ARMOUR THYROID 60 MG tablet Take 1 tablet (60 mg total) by mouth daily.  90 tablet  3  . Ascorbic Acid (VITAMIN C) 500 MG tablet Take 500 mg by mouth daily.        . Calcium Carbonate-Vit D-Min (CALCIUM 1200 PO) Take 1 capsule by mouth daily.        . digoxin (LANOXIN) 0.125 MG tablet TAKE 1 TABLET BY MOUTH EVERY DAY  30 tablet  3  . diphenhydramine-acetaminophen (TYLENOL PM) 25-500 MG TABS Take 1 tablet by mouth at bedtime as needed.        . felodipine (PLENDIL) 2.5 MG 24 hr tablet TAKE 1 TABLET EVERY DAY  90 tablet  1  . fish oil-omega-3 fatty acids 1000 MG capsule Take 1 g by mouth daily.        . furosemide (LASIX) 40 MG tablet TAKE 1 TABLET BY MOUTH 2 TIMES DAILY.  60 tablet  6  . GLUCOSAMINE-CHONDROITIN-VIT D3 PO Take 1 tablet by mouth daily.        Marland Kitchen L-Lysine 500 MG TABS Take 1 tablet by mouth daily.        . metoprolol (LOPRESSOR) 50 MG tablet 25 mg. Take 1/2 pill in the Am      . potassium chloride (KLOR-CON) 20 MEQ packet Take 20 mEq by mouth daily.      . potassium chloride SA (KLOR-CON M20) 20 MEQ tablet Take 20 mEq by mouth daily.      . Rivaroxaban (XARELTO) 20 MG TABS Take 1 tablet (20 mg  total) by mouth daily.  90 tablet  3  . traZODone (DESYREL) 50 MG tablet Take 1.5 tablets (75 mg total) by mouth at bedtime.  135 tablet  2  . Triamcinolone Acetonide (TRIAMCINOLONE 0.1 % CREAM : EUCERIN) CREA Apply 1 application topically 2 (two) times daily as needed.  1 each  1  . montelukast (SINGULAIR) 10 MG tablet Take 1 tablet (10 mg total) by mouth at bedtime.  30 tablet  3  .  predniSONE (DELTASONE) 20 MG tablet Take 1 tablet (20 mg total) by mouth 2 (two) times daily.  28 tablet  0  . Selenium 200 MCG TABS Take 1 tablet by mouth daily.        . [DISCONTINUED] predniSONE (DELTASONE) 20 MG tablet Take 1 tablet (20 mg total) by mouth 2 (two) times daily.  28 tablet  0  . [EXPIRED] methylPREDNISolone acetate (DEPO-MEDROL) injection 40 mg        No facility-administered encounter medications on file as of 09/26/2012.     No orders of the defined types were placed in this encounter.    No Follow-up on file.

## 2012-09-26 NOTE — Telephone Encounter (Signed)
FYI..Pt came in today complaining of a rash. States that she had seen Dr. Dan Humphreys and was told to use Traimcinolone ointment and Eucerin. Pt states the rash never went away and is spreading to more of her body. Stated that there is no pain. Has an appt with [facility] today at 3:30.

## 2012-09-28 ENCOUNTER — Encounter: Payer: Self-pay | Admitting: Internal Medicine

## 2012-09-28 NOTE — Assessment & Plan Note (Signed)
She was treated for liver urticaria by Dr. walker a week ago and the rash has actually worsened. Given its distribution it looks more like a pityriasis rosacea like drug eruption. I am prescribing prednisone 20 mg twice daily for [redacted] weeks along with Singulair in the event that this is allergic urticaria or some other process involving a mast cell release. She can continue Benadryl for treatment of the itching. I would like her to return in 10 days a week and begin the steroid taper.

## 2012-09-30 ENCOUNTER — Other Ambulatory Visit: Payer: Self-pay

## 2012-09-30 MED ORDER — RIVAROXABAN 20 MG PO TABS: 20.0000 mg | ORAL_TABLET | Freq: Every day | ORAL | Status: DC

## 2012-09-30 MED ORDER — POTASSIUM CHLORIDE CRYS ER 20 MEQ PO TBCR: 20.0000 meq | EXTENDED_RELEASE_TABLET | Freq: Every day | ORAL | Status: DC

## 2012-10-03 ENCOUNTER — Encounter: Payer: Self-pay | Admitting: Internal Medicine

## 2012-10-03 ENCOUNTER — Ambulatory Visit (INDEPENDENT_AMBULATORY_CARE_PROVIDER_SITE_OTHER): Payer: Medicare Other | Admitting: Internal Medicine

## 2012-10-03 VITALS — BP 130/76 | HR 74 | Temp 97.4°F | Resp 12 | Wt 123.0 lb

## 2012-10-03 DIAGNOSIS — R42 Dizziness and giddiness: Secondary | ICD-10-CM

## 2012-10-03 DIAGNOSIS — L42 Pityriasis rosea: Secondary | ICD-10-CM

## 2012-10-03 MED ORDER — PREDNISONE 5 MG PO TABS: ORAL_TABLET | ORAL | Status: DC

## 2012-10-03 MED ORDER — RIVAROXABAN 20 MG PO TABS: 20.0000 mg | ORAL_TABLET | Freq: Every day | ORAL | Status: DC

## 2012-10-03 NOTE — Patient Instructions (Signed)
Continue the 20 mg prednisone dose twice daily bottle  until gone (one more week)  Then we will taper the prednisone as follows:  20 mg daily for 2 days,  Then 10 mg daily for 2 days ,  Then 5 mg daily for two days,  Then 5 mg every other day for 2 days then stop   You will have 5 mg tablets to use for the taper )

## 2012-10-03 NOTE — Assessment & Plan Note (Addendum)
improved with 20 mg bid, and her skin looks great today .  Will finish the week of 20 mg bid,  Then begin 8 day taper.  New rx printed for the taper,  Patient was instructed repeatedly to finish the current bottle of prednisone before beginning the taper.

## 2012-10-03 NOTE — Progress Notes (Signed)
Patient ID: Donna Rosario, female   DOB: [birth year], 79 y.o.   MRN: [id number]  Patient Active Problem List  Diagnosis  . HYPERTENSION, BENIGN  . ATRIAL FIBRILLATION  . EDEMA  . DIASTOLIC DYSFUNCTION  . Dementia  . Venous insufficiency of leg  . Recurrent epistaxis  . Insomnia  . Dizziness  . Allergic urticaria  . Pityriasis rosea-like drug eruption    Subjective:  CC:   Chief Complaint  Patient presents with  . Urticaria    Has had hives for over a week, they were worse and some of them went away then they returned. Has been taking her prednisone she was prescribed about a week ago. They are still itching and just not going away.    HPI:   Donna Rosario a 79 y.o. female who presents as a walk in for follow up on annually occurring macular pruritic rash.  Was seen on April 4th for persistent symptoms that did not respond to Dr. Tilman Neat treatment, was treated and asked to  return in ten days, but did not make appt.. On April 4th I prescribed a two week  Course of prednisone 20 mg bid for what appeared to be pityriasis.  She reports that the rash resolved so she suspended the prednisone for a day and the  rash returned with severe pruritus and recurrent red patchesa.  She resumed the prednisone yesterday and the symptoms are improving. She reports no new spots.    2) dizziness with recent fall.  She is reporting occasional dizziness without true vertigo,  And yesterday had a fall at home in her kitchen due to loss of balance.  Bruised her hands,  But nothing hurts. No LOC.  Has had PT for vestibular training and defers repeat referral . Thinks the sympotms may be due to the prednisone..  Not orthostatic by my check today.  Gait is not ataxic.    Past Medical History  Diagnosis Date  . Chronic low back pain     due to lumbosacral degenerative joint disease  . Hypertension   . Arthritis   . Osteoporosis   . Atrial fibrillation Sept. 2011    [facility] Admission for RVR  . Thyroid  disease     Hypothyroidism  . Disturbance of skin sensation   . Hyperlipidemia   . Mitral valve disorders   . Sciatica   . Vascular dementia, uncomplicated     Past Surgical History  Procedure Laterality Date  . Knee surgery      Left knee  . Thyroidectomy    . Total hip arthroplasty  2011  . Joint replacement      Right Hip Oct 2011,  Left Knee 2008 ([facility])       The following portions of the patient's history were reviewed and updated as appropriate: Allergies, current medications, and problem list.    Review of Systems:  Patient denies headache, fevers, malaise, unintentional weight loss, skin rash, eye pain, sinus congestion and sinus pain, sore throat, dysphagia,  hemoptysis , cough, dyspnea, wheezing, chest pain, palpitations, orthopnea, edema, abdominal pain, nausea, melena, diarrhea, constipation, flank pain, dysuria, hematuria, urinary  Frequency, nocturia, numbness, tingling, seizures,  Focal weakness, Loss of consciousness,  Tremor, insomnia, depression, anxiety, and suicidal ideation.       History   Social History  . Marital Status: Married    Spouse Name: N/A    Number of Children: N/A  . Years of Education: N/A   Occupational History  . Not on  file.   Social History Main Topics  . Smoking status: Never Smoker   . Smokeless tobacco: Never Used  . Alcohol Use: 0.6 oz/week    1 Glasses of wine per week     Comment: occasional  . Drug Use: No  . Sexually Active: Not on file   Other Topics Concern  . Not on file   Social History Narrative  . No narrative on file    Objective:  BP 130/76  Pulse 74  Temp(Src) 97.4 F (36.3 C) (Oral)  Resp 12  Wt 123 lb (55.792 kg)  BMI 22.69 kg/m2  SpO2 98%  General appearance: alert, cooperative and appears stated age Ears: normal TM's and external ear canals both ears Throat: lips, mucosa, and tongue normal; teeth and gums normal Neck: no adenopathy, no carotid bruit, supple, symmetrical, trachea  midline and thyroid not enlarged, symmetric, no tenderness/mass/nodules Back: symmetric, no curvature. ROM normal. No CVA tenderness. Lungs: clear to auscultation bilaterally Heart: regular rate and rhythm, S1, S2 normal, no murmur, click, rub or gallop Abdomen: soft, non-tender; bowel sounds normal; no masses,  no organomegaly Pulses: 2+ and symmetric Skin: Skin color, texture, turgor normal. No rashes or lesions Lymph nodes: Cervical, supraclavicular, and axillary nodes normal.  Assessment and Plan:  Pityriasis rosea-like drug eruption improved with 20 mg bid, and her skin looks great today .  Will finish the week of 20 mg bid,  Then begin 8 day taper.  New rx printed for the taper,  Patient was instructed repeatedly to finish the current bottle of prednisone before beginning the taper.   Dizziness Chronic, recurrent. With recent history of fall.  Neurolgoic exam is normal except for dementia. She has no interest in PT at this time.  She is getting  [facility] today    Updated Medication List Outpatient Encounter Prescriptions as of 10/03/2012  Medication Sig Dispense Refill  . alendronate (FOSAMAX) 70 MG tablet Take 1 tablet (70 mg total) by mouth every 7 (seven) days. Take with a full glass of water on an empty stomach.  48 tablet  1  . ARMOUR THYROID 60 MG tablet Take 1 tablet (60 mg total) by mouth daily.  90 tablet  3  . Ascorbic Acid (VITAMIN C) 500 MG tablet Take 500 mg by mouth daily.        . Calcium Carbonate-Vit D-Min (CALCIUM 1200 PO) Take 1 capsule by mouth daily.        . digoxin (LANOXIN) 0.125 MG tablet TAKE 1 TABLET BY MOUTH EVERY DAY  30 tablet  3  . diphenhydramine-acetaminophen (TYLENOL PM) 25-500 MG TABS Take 1 tablet by mouth at bedtime as needed.        . felodipine (PLENDIL) 2.5 MG 24 hr tablet TAKE 1 TABLET EVERY DAY  90 tablet  1  . fish oil-omega-3 fatty acids 1000 MG capsule Take 1 g by mouth daily.        . furosemide (LASIX) 40 MG tablet TAKE 1 TABLET BY  MOUTH 2 TIMES DAILY.  60 tablet  6  . GLUCOSAMINE-CHONDROITIN-VIT D3 PO Take 1 tablet by mouth daily.        Marland Kitchen L-Lysine 500 MG TABS Take 1 tablet by mouth daily.        . metoprolol (LOPRESSOR) 50 MG tablet 25 mg. Take 1/2 pill in the Am      . montelukast (SINGULAIR) 10 MG tablet Take 1 tablet (10 mg total) by mouth at bedtime.  30 tablet  3  .  potassium chloride (KLOR-CON) 20 MEQ packet Take 20 mEq by mouth daily.      . potassium chloride SA (KLOR-CON M20) 20 MEQ tablet Take 1 tablet (20 mEq total) by mouth daily.  90 tablet  3  . predniSONE (DELTASONE) 20 MG tablet Take 1 tablet (20 mg total) by mouth 2 (two) times daily.  28 tablet  0  . Rivaroxaban (XARELTO) 20 MG TABS Take 1 tablet (20 mg total) by mouth daily.  90 tablet  3  . Selenium 200 MCG TABS Take 1 tablet by mouth daily.        . traZODone (DESYREL) 50 MG tablet Take 1.5 tablets (75 mg total) by mouth at bedtime.  135 tablet  2  . Triamcinolone Acetonide (TRIAMCINOLONE 0.1 % CREAM : EUCERIN) CREA Apply 1 application topically 2 (two) times daily as needed.  1 each  1  . [DISCONTINUED] Rivaroxaban (XARELTO) 20 MG TABS Take 1 tablet (20 mg total) by mouth daily.  90 tablet  3  . predniSONE (DELTASONE) 5 MG tablet Taper according to schedule :  4 tablet daily x 2 days,  Then 2 tablets daily x 2 days,  Then 1 daily x 2 days,  Then one every other day x 2 days  16 tablet  0   No facility-administered encounter medications on file as of 10/03/2012.     No orders of the defined types were placed in this encounter.    No Follow-up on file.

## 2012-10-04 NOTE — Assessment & Plan Note (Addendum)
Chronic, recurrent. With recent history of fall.  Neurolgoic exam is normal except for dementia. She has no interest in PT at this time.  She is getting  [facility] today

## 2012-10-06 ENCOUNTER — Telehealth: Payer: Self-pay | Admitting: Internal Medicine

## 2012-10-06 NOTE — Telephone Encounter (Signed)
Pt states that she is experiencing dizziness, not headaches. She also states that her symptoms are not bad. Pt was advised to have the nurse at [facility] check her vitals (especially her BP), and to continue tapering her prednisone as prescribed.

## 2012-10-06 NOTE — Telephone Encounter (Signed)
Prednisone ,symptoms having terrible headaches. She refused to talk with the triage nurse. Wants a call from Dr. Darrick Huntsman.

## 2012-10-06 NOTE — Telephone Encounter (Signed)
Pt states she woke up this morning and states she felt very dizzy. Is still taking the prednisone. Please advise.

## 2012-10-06 NOTE — Telephone Encounter (Signed)
Is it dizziness or terrible headache?  I see two messages.  She needs to have her  Blood pressure and vital signs checked. If she lives at [facility] she can go to the nurse there or make an appt to be seen .  The prednisone needs to be tapered off,  NOT stopped abruptly. So she should start the tapering dose I gave her last week. \

## 2012-10-08 ENCOUNTER — Other Ambulatory Visit: Payer: Self-pay | Admitting: Internal Medicine

## 2012-11-11 ENCOUNTER — Ambulatory Visit: Payer: Self-pay | Admitting: Internal Medicine

## 2012-11-19 ENCOUNTER — Other Ambulatory Visit: Payer: Self-pay | Admitting: Internal Medicine

## 2012-11-20 ENCOUNTER — Other Ambulatory Visit: Payer: Self-pay | Admitting: Internal Medicine

## 2012-12-01 ENCOUNTER — Other Ambulatory Visit: Payer: Self-pay | Admitting: Internal Medicine

## 2012-12-14 ENCOUNTER — Other Ambulatory Visit: Payer: Self-pay | Admitting: Cardiovascular Disease

## 2012-12-15 ENCOUNTER — Other Ambulatory Visit: Payer: Self-pay | Admitting: *Deleted

## 2012-12-15 MED ORDER — FUROSEMIDE 40 MG PO TABS: ORAL_TABLET | ORAL | Status: DC

## 2012-12-15 NOTE — Telephone Encounter (Signed)
Refilled Furosemide sent to [facility] pharmacy. 

## 2012-12-30 ENCOUNTER — Ambulatory Visit: Payer: Medicare Other | Admitting: Internal Medicine

## 2013-01-05 ENCOUNTER — Encounter: Payer: Self-pay | Admitting: Internal Medicine

## 2013-01-30 ENCOUNTER — Encounter: Payer: Self-pay | Admitting: Internal Medicine

## 2013-01-30 ENCOUNTER — Ambulatory Visit (INDEPENDENT_AMBULATORY_CARE_PROVIDER_SITE_OTHER): Payer: Medicare Other | Admitting: Internal Medicine

## 2013-01-30 VITALS — BP 134/68 | HR 74 | Temp 97.8°F | Resp 14 | Ht 62.0 in | Wt 127.2 lb

## 2013-01-30 DIAGNOSIS — R5383 Other fatigue: Secondary | ICD-10-CM

## 2013-01-30 DIAGNOSIS — Z1211 Encounter for screening for malignant neoplasm of colon: Secondary | ICD-10-CM

## 2013-01-30 DIAGNOSIS — F0390 Unspecified dementia without behavioral disturbance: Secondary | ICD-10-CM

## 2013-01-30 DIAGNOSIS — E785 Hyperlipidemia, unspecified: Secondary | ICD-10-CM

## 2013-01-30 DIAGNOSIS — I10 Essential (primary) hypertension: Secondary | ICD-10-CM

## 2013-01-30 DIAGNOSIS — E559 Vitamin D deficiency, unspecified: Secondary | ICD-10-CM

## 2013-01-30 DIAGNOSIS — R040 Epistaxis: Secondary | ICD-10-CM

## 2013-01-30 DIAGNOSIS — G4700 Insomnia, unspecified: Secondary | ICD-10-CM

## 2013-01-30 DIAGNOSIS — I872 Venous insufficiency (chronic) (peripheral): Secondary | ICD-10-CM

## 2013-01-30 DIAGNOSIS — N760 Acute vaginitis: Secondary | ICD-10-CM

## 2013-01-30 DIAGNOSIS — Z0000 Encounter for general adult medical examination without abnormal findings: Secondary | ICD-10-CM

## 2013-01-30 DIAGNOSIS — R5381 Other malaise: Secondary | ICD-10-CM

## 2013-01-30 LAB — CBC WITH DIFFERENTIAL/PLATELET
Basophils Absolute: 0.1 10*3/uL (ref 0.0–0.1)
HCT: 45.4 % (ref 36.0–46.0)
Hemoglobin: 14.9 g/dL (ref 12.0–15.0)
Lymphocytes Relative: 35 % (ref 12–46)
Lymphs Abs: 3.3 10*3/uL (ref 0.7–4.0)
Monocytes Absolute: 0.9 10*3/uL (ref 0.1–1.0)
Neutro Abs: 5.1 10*3/uL (ref 1.7–7.7)
RBC: 5.23 MIL/uL — ABNORMAL HIGH (ref 3.87–5.11)
RDW: 15 % (ref 11.5–15.5)
WBC: 9.4 10*3/uL (ref 4.0–10.5)

## 2013-01-30 NOTE — Patient Instructions (Addendum)
I recommend that you stop the trazodone and TRY TYLENOL PM AT BEDTIME for insomnia   Please call us back with the name of the cream you bought for itching   We will call you with the results of your bloodwork

## 2013-01-30 NOTE — Progress Notes (Signed)
Patient ID: Donna Rosario, female   DOB: 06/24/1923, 79 y.o.   MRN: [id number] The patient is here for annual Medicare wellness examination and management of other chronic and acute problems.  Insomnia, headache with trazodone History of fall early February 2014 while getting her mail, persistent left knee effusion,  Prior attempts to drain unsuccessful.  Orhtopdics  Is now managing.  No falls since then. Uses a cane now .  Widowed,  Children are involved from afar an d manage her finances. One lives in  [place] , the other in [place] (son and dtr) and check on her twice weekly . Lives at [facility] but prepares her own meals and eats out.   Drives her own care,  No traffic violations int he last year. Doe snot have a healthcare POA and does not want me conveying the results of today's visit with children. They are visiting in November.    The risk factors are reflected in the social history.  The roster of all physicians providing medical care to patient - is listed in the Snapshot section of the chart.  Activities of daily living:  The patient is 100% independent in all ADLs: dressing, toileting, feeding as well as independent mobility  Home safety : The patient has smoke detectors in the home. They wear seatbelts.  There are no firearms at home. There is no violence in the home.   There is no risks for hepatitis, STDs or HIV. There is no   history of blood transfusion. They have no travel history to infectious disease endemic areas of the world.  The patient has seen their dentist in the last six month. They have seen their eye doctor in the last year. They admit to slight hearing difficulty with regard to whispered voices and some television programs.  They have deferred audiologic testing in the last year.  They do not  have excessive sun exposure. Discussed the need for sun protection: hats, long sleeves and use of sunscreen if there is significant sun exposure.   Diet: the importance  of a healthy diet is discussed. They do have a healthy diet.  The benefits of regular aerobic exercise were discussed. She exercises 5 days per week ,  30 minutes.   Depression screen: there are no signs or vegative symptoms of depression- irritability, change in appetite, anhedonia, sadness/tearfullness.  Cognitive assessment: the patient does not manage their financial affairs but does manage personal affairs and is actively engaged. They could relate day,date,year and events; recalled 0/3 objects at 3 minutes; performed clock-face test abnormally.  The following portions of the patient's history were reviewed and updated as appropriate: allergies, current medications, past family history, past medical history,  past surgical history, past social history  and problem list.  Visual acuity was not assessed per patient preference since she has regular follow up with her ophthalmologist. Spring 2014 Porfilio.  and body mass index were assessed and reviewed.   During the course of the visit the patient was educated and counseled about appropriate screening and preventive services including : fall prevention , diabetes screening, nutrition counseling, colorectal cancer screening, and recommended immunizations.    Objective:  BP 134/68  Pulse 74  Temp(Src) 97.8 F (36.6 C) (Oral)  Resp 14  Ht 5\' 2"  (1.575 m)  Wt 127 lb 4 oz (57.72 kg)  BMI 23.27 kg/m2  SpO2 98%  General appearance: alert, cooperative and appears stated age Head: Normocephalic, without obvious abnormality, atraumatic Eyes: conjunctivae/corneas clear. PERRL,  EOM's intact. Fundi benign. Ears: normal TM's and external ear canals both ears Nose: Nares normal. Septum midline. Mucosa normal. No drainage or sinus tenderness. Throat: lips, mucosa, and tongue normal; teeth and gums normal Neck: no adenopathy, no carotid bruit, no JVD, supple, symmetrical, trachea midline and thyroid not enlarged, symmetric, no  tenderness/mass/nodules Lungs: clear to auscultation bilaterally Breasts: normal appearance, no masses or tenderness Heart: regular rate and rhythm, S1, S2 normal, no murmur, click, rub or gallop Abdomen: soft, non-tender; bowel sounds normal; no masses,  no organomegaly Extremities: extremities normal, atraumatic, no cyanosis or edema Pulses: 2+ and symmetric Skin: Skin color, texture, turgor normal. No rashes or lesions Neurologic: Alert and oriented X 3, normal strength and tone. Normal symmetric reflexes. Normal coordination and gait.    MMSE:  Notable for 03 recall, trouble with calculation (how many nickes iin $1.20?) and abtract image: could not draw the hands correctly on a clock to  reflect the time as 11:10   Assessment and Plan:  Venous insufficiency of leg Mild, with venous stasis changes.  Does not wear compression stockings despite recommendations.   Vaginitis and vulvovaginitis Chronic, advised to use the steroid cream she purchased over the counter on affected exterior areas and return in two weeks for pelvic exam if not improved.   HYPERTENSION, BENIGN Well controlled on current regimen. Renal function stable, no changes today.  Dementia present for over two years. Patienthas refused HH aide in the past and thus far is taking her medications correctly.    Updated Medication List Outpatient Encounter Prescriptions as of 01/30/2013  Medication Sig Dispense Refill  . alendronate (FOSAMAX) 70 MG tablet TAKE 1 TABLET ONCE WEEKLY--TAKE WITH FULL GLASS OF WATER ON EMPTY STOMACH  12 tablet  1  . ARMOUR THYROID 60 MG tablet Take 1 tablet (60 mg total) by mouth daily.  90 tablet  3  . Ascorbic Acid (VITAMIN C) 500 MG tablet Take 500 mg by mouth daily.        . Calcium Carbonate-Vit D-Min (CALCIUM 1200 PO) Take 1 capsule by mouth daily.        Marland Kitchen DIGOX 125 MCG tablet TAKE 1 TABLET BY MOUTH EVERY DAY  30 tablet  5  . digoxin (LANOXIN) 0.125 MG tablet TAKE 1 TABLET BY MOUTH EVERY  DAY  30 tablet  3  . diphenhydramine-acetaminophen (TYLENOL PM) 25-500 MG TABS Take 1 tablet by mouth at bedtime as needed.        . felodipine (PLENDIL) 2.5 MG 24 hr tablet TAKE 1 TABLET EVERY DAY  90 tablet  1  . fish oil-omega-3 fatty acids 1000 MG capsule Take 1 g by mouth daily.        . furosemide (LASIX) 40 MG tablet TAKE 1 TABLET BY MOUTH 2 TIMES DAILY.  60 tablet  6  . GLUCOSAMINE-CHONDROITIN-VIT D3 PO Take 1 tablet by mouth daily.        Marland Kitchen L-Lysine 500 MG TABS Take 1 tablet by mouth daily.        . metoprolol (LOPRESSOR) 50 MG tablet 25 mg. Take 1/2 pill in the Am      . potassium chloride SA (KLOR-CON M20) 20 MEQ tablet Take 1 tablet (20 mEq total) by mouth daily.  90 tablet  3  . Selenium 200 MCG TABS Take 1 tablet by mouth daily.        . traZODone (DESYREL) 50 MG tablet Take 1.5 tablets (75 mg total) by mouth at bedtime.  135 tablet  2  . [DISCONTINUED] potassium chloride (KLOR-CON) 20 MEQ packet Take 20 mEq by mouth daily.      . montelukast (SINGULAIR) 10 MG tablet Take 1 tablet (10 mg total) by mouth at bedtime.  30 tablet  3  . Rivaroxaban (XARELTO) 20 MG TABS Take 1 tablet (20 mg total) by mouth daily.  90 tablet  3  . Triamcinolone Acetonide (TRIAMCINOLONE 0.1 % CREAM : EUCERIN) CREA Apply 1 application topically 2 (two) times daily as needed.  1 each  1  . [DISCONTINUED] predniSONE (DELTASONE) 20 MG tablet Take 1 tablet (20 mg total) by mouth 2 (two) times daily.  28 tablet  0  . [DISCONTINUED] predniSONE (DELTASONE) 5 MG tablet Taper according to schedule :  4 tablet daily x 2 days,  Then 2 tablets daily x 2 days,  Then 1 daily x 2 days,  Then one every other day x 2 days  16 tablet  0   No facility-administered encounter medications on file as of 01/30/2013.

## 2013-01-31 LAB — COMPREHENSIVE METABOLIC PANEL
AST: 27 U/L (ref 0–37)
Albumin: 4.1 g/dL (ref 3.5–5.2)
Alkaline Phosphatase: 65 U/L (ref 39–117)
Glucose, Bld: 90 mg/dL (ref 70–99)
Potassium: 4.2 mEq/L (ref 3.5–5.3)
Sodium: 139 mEq/L (ref 135–145)
Total Protein: 6.7 g/dL (ref 6.0–8.3)

## 2013-01-31 LAB — VITAMIN D 25 HYDROXY (VIT D DEFICIENCY, FRACTURES): Vit D, 25-Hydroxy: 54 ng/mL (ref 30–89)

## 2013-02-01 NOTE — Assessment & Plan Note (Signed)
Well controlled on current regimen. Renal function stable, no changes today. 

## 2013-02-01 NOTE — Assessment & Plan Note (Signed)
present for over two years. Patienthas refused HH aide in the past and thus far is taking her medications correctly.

## 2013-02-01 NOTE — Assessment & Plan Note (Signed)
Mild, with venous stasis changes.  Does not wear compression stockings despite recommendations.

## 2013-02-01 NOTE — Assessment & Plan Note (Signed)
Chronic, advised to use the steroid cream she purchased over the counter on affected exterior areas and return in two weeks for pelvic exam if not improved.

## 2013-02-03 ENCOUNTER — Encounter: Payer: Self-pay | Admitting: *Deleted

## 2013-02-10 ENCOUNTER — Ambulatory Visit: Payer: Medicare Other | Admitting: Cardiovascular Disease

## 2013-02-11 ENCOUNTER — Encounter: Payer: Self-pay | Admitting: Cardiovascular Disease

## 2013-02-11 ENCOUNTER — Ambulatory Visit (INDEPENDENT_AMBULATORY_CARE_PROVIDER_SITE_OTHER): Payer: Medicare Other | Admitting: Cardiovascular Disease

## 2013-02-11 VITALS — BP 132/64 | HR 77 | Ht 63.0 in | Wt 128.5 lb

## 2013-02-11 DIAGNOSIS — R079 Chest pain, unspecified: Secondary | ICD-10-CM

## 2013-02-11 DIAGNOSIS — I4902 Ventricular flutter: Secondary | ICD-10-CM

## 2013-02-11 DIAGNOSIS — I4891 Unspecified atrial fibrillation: Secondary | ICD-10-CM

## 2013-02-11 DIAGNOSIS — R609 Edema, unspecified: Secondary | ICD-10-CM

## 2013-02-11 DIAGNOSIS — I519 Heart disease, unspecified: Secondary | ICD-10-CM

## 2013-02-11 NOTE — Progress Notes (Signed)
Patient ID: Donna Rosario, female    DOB: [birth year], 79 y.o.   MRN: [id number]  HPI Comments: Ms. Henkin is a 79 year old woman, patient of Dr. Darrick Huntsman, with history of  sciatic and back pain, "crushed right hip", s/p right hip replacement,  who was admitted to the hospital in early September 2011 with atrial fibrillation, started on warfarin and rate control, underlying mild dementia and confusion with polypharmacy, taken off warfarin secondary to inability to manage this herself,  previously started on Pradaxa b.i.d.  We did have 2  nursing agencies try to establish a relationship with her for polypharmacy. She  discharged them.  Mild chronic lower extremity edema. Significant confusion in the past with her medications.  Overall, she is doing well. Edema is mild and stable . She exercises several times per week. She does report occasional episodes of chest pain lasting for 1-2 minutes, coming on at rest, relieved with exercise . Episode frequency is not progressing, happens maybe once a week . Otherwise she feels well with no complaints. Active, does aerobic activity/exercise with swimming with no chest pain . She reports that she is taking Lasix 40 mg daily. Also takes her xarelto every morning. In the past, she drank a significant amount of soda/Pepsi.  She's had falls in the past. Mostly mechanical falls. Reports her balance is not good.  EKG shows atrial fibrillation with ventricular rate of 77 beats per minute, no significant ST or T wave changes  Previous echocardiogram shows normal systolic function, ejection fraction 55%, mild LVH with diastolic dysfunction, mildly dilated left atrium, mild to moderate MR, moderate TR with elevated right ventricular systolic pressures consistent with mild pulmonary hypertension   Outpatient Encounter Prescriptions as of 02/11/2013  Medication Sig Dispense Refill  . alendronate (FOSAMAX) 70 MG tablet TAKE 1 TABLET ONCE WEEKLY--TAKE WITH FULL GLASS OF WATER ON  EMPTY STOMACH  12 tablet  1  . ARMOUR THYROID 60 MG tablet Take 1 tablet (60 mg total) by mouth daily.  90 tablet  3  . Ascorbic Acid (VITAMIN C) 500 MG tablet Take 500 mg by mouth daily.        . Calcium Carbonate-Vit D-Min (CALCIUM 1200 PO) Take 1 capsule by mouth daily.        Marland Kitchen DIGOX 125 MCG tablet TAKE 1 TABLET BY MOUTH EVERY DAY  30 tablet  5  . diphenhydramine-acetaminophen (TYLENOL PM) 25-500 MG TABS Take 1 tablet by mouth at bedtime as needed.        . felodipine (PLENDIL) 2.5 MG 24 hr tablet TAKE 1 TABLET EVERY DAY  90 tablet  1  . fish oil-omega-3 fatty acids 1000 MG capsule Take 1 g by mouth daily.        . furosemide (LASIX) 40 MG tablet TAKE 1 TABLET BY MOUTH 2 TIMES DAILY.  60 tablet  6  . GLUCOSAMINE-CHONDROITIN-VIT D3 PO Take 1 tablet by mouth daily.        Marland Kitchen L-Lysine 500 MG TABS Take 1 tablet by mouth daily.        . metoprolol (LOPRESSOR) 50 MG tablet Take 1/2 pill in the Am      . montelukast (SINGULAIR) 10 MG tablet Take 1 tablet (10 mg total) by mouth at bedtime.  30 tablet  3  . potassium chloride SA (KLOR-CON M20) 20 MEQ tablet Take 1 tablet (20 mEq total) by mouth daily.  90 tablet  3  . Rivaroxaban (XARELTO) 20 MG TABS Take 1 tablet (20 mg  total) by mouth daily.  90 tablet  3  . Selenium 200 MCG TABS Take 1 tablet by mouth daily.        . traZODone (DESYREL) 50 MG tablet Take 1.5 tablets (75 mg total) by mouth at bedtime.  135 tablet  2    Review of Systems  Constitutional: Negative.   HENT: Negative.   Eyes: Negative.   Respiratory: Negative.   Cardiovascular: Negative.   Gastrointestinal: Negative.   Musculoskeletal: Negative.   Skin: Negative.   Neurological: Negative.   Psychiatric/Behavioral: Negative.   All other systems reviewed and are negative.   BP 132/64  Pulse 77  Ht 5\' 3"  (1.6 m)  Wt 128 lb 8 oz (58.287 kg)  BMI 22.77 kg/m2   Physical Exam  Nursing note and vitals reviewed. Constitutional: She is oriented to person, place, and time. She  appears well-developed and well-nourished.  HENT:  Head: Normocephalic.  Nose: Nose normal.  Mouth/Throat: Oropharynx is clear and moist.  Eyes: Conjunctivae are normal. Pupils are equal, round, and reactive to light.  Neck: Normal range of motion. Neck supple. No JVD present.  Cardiovascular: Normal rate, normal heart sounds and intact distal pulses.  An irregularly irregular rhythm present. Exam reveals no gallop and no friction rub.   No murmur heard. Trace ankle edema  Pulmonary/Chest: Effort normal and breath sounds normal. No respiratory distress. She has no wheezes. She has no rales. She exhibits no tenderness.  Abdominal: Soft. Bowel sounds are normal. She exhibits no distension. There is no tenderness.  Musculoskeletal: Normal range of motion. She exhibits no tenderness.  Lymphadenopathy:    She has no cervical adenopathy.  Neurological: She is alert and oriented to person, place, and time. Coordination normal.  Skin: Skin is warm and dry. No rash noted. No erythema.  Psychiatric: She has a normal mood and affect. Her behavior is normal. Judgment and thought content normal.    Assessment and Plan

## 2013-02-11 NOTE — Assessment & Plan Note (Signed)
Rate is well controlled. She's taking her xarelto,. No changes to her medications

## 2013-02-11 NOTE — Assessment & Plan Note (Signed)
Stable on today's visit

## 2013-02-11 NOTE — Patient Instructions (Addendum)
You are doing well. No medication changes were made.  Please call us if you have new issues that need to be addressed before your next appt.  Your physician wants you to follow-up in: 6 months.  You will receive a reminder letter in the mail two months in advance. If you don't receive a letter, please call our office to schedule the follow-up appointment.   

## 2013-02-11 NOTE — Assessment & Plan Note (Signed)
Clinically appears to be to be stable on Lasix 40 mg daily

## 2013-02-11 NOTE — Assessment & Plan Note (Signed)
Atypical chest pain, coming on at rest, relieved with exercise. Very infrequent. Suggested we monitor this for now. She develops increasing frequency of symptoms, particularly with exertion, could do further evaluation and testing.

## 2013-03-12 ENCOUNTER — Other Ambulatory Visit: Payer: Self-pay | Admitting: Internal Medicine

## 2013-04-14 ENCOUNTER — Other Ambulatory Visit: Payer: Self-pay | Admitting: Internal Medicine

## 2013-06-10 ENCOUNTER — Other Ambulatory Visit: Payer: Self-pay | Admitting: Internal Medicine

## 2013-06-12 ENCOUNTER — Telehealth: Payer: Self-pay | Admitting: Internal Medicine

## 2013-06-12 DIAGNOSIS — I4891 Unspecified atrial fibrillation: Secondary | ICD-10-CM

## 2013-06-12 NOTE — Telephone Encounter (Signed)
The patient is very upset she has been taking Xarelto for many months and she was paying $50.00 for 30 pills . For October she paid 100.00 for 30 pills in November she paid 150.00. She spoke with the pharmacy and they told her to wait another month and see what she would have to pay . The patient is wanting to talk with Dr. Darrick Huntsman to see if there is another medication she can take in the place of Xarelto.

## 2013-06-12 NOTE — Telephone Encounter (Signed)
She can make appt.  But in the past she did not tolerate coumadin

## 2013-06-12 NOTE — Telephone Encounter (Signed)
Will defer for Dr. Darrick Huntsman to decide about changing anticoagulant.

## 2013-06-12 NOTE — Telephone Encounter (Signed)
We have lots of samples to get her through the year Will call her now Suspect she may have hit the donut hole Would see if price drops back down in Jan. 2015 If not, we could try eliquis 5 BID (would check pricing first) We also have eliquis samples to get her started, free month coupon

## 2013-06-12 NOTE — Telephone Encounter (Signed)
Spoke w/ pt.  Will leave samples of Xarelto 20mg  at front desk for her to pick up. She states that she will try to pick them up on Monday.

## 2013-06-12 NOTE — Telephone Encounter (Signed)
Tim , do you see any reason why I can't switch Donna Rosario to Pradaxa if it will cost her less?  She is concerned about the cost of Xarelto.

## 2013-06-13 NOTE — Telephone Encounter (Signed)
Thank you :)

## 2013-06-19 ENCOUNTER — Other Ambulatory Visit: Payer: Self-pay | Admitting: Internal Medicine

## 2013-06-21 ENCOUNTER — Other Ambulatory Visit: Payer: Self-pay | Admitting: Cardiovascular Disease

## 2013-06-23 ENCOUNTER — Other Ambulatory Visit: Payer: Self-pay | Admitting: *Deleted

## 2013-06-23 MED ORDER — METOPROLOL TARTRATE 50 MG PO TABS: ORAL_TABLET | ORAL | Status: DC

## 2013-06-23 NOTE — Telephone Encounter (Signed)
Requested Prescriptions   Signed Prescriptions Disp Refills  . metoprolol (LOPRESSOR) 50 MG tablet 90 tablet 3    Sig: Take 1 and 1/2 tablet twice daily.    Authorizing Provider: Antonieta Iba    Ordering User: Kendrick Fries

## 2013-06-30 ENCOUNTER — Emergency Department: Payer: Self-pay | Admitting: Emergency Medicine

## 2013-06-30 ENCOUNTER — Telehealth: Payer: Self-pay | Admitting: *Deleted

## 2013-06-30 LAB — CBC
HCT: 47.4 % — AB (ref 35.0–47.0)
HGB: 15.5 g/dL (ref 12.0–16.0)
MCH: 28.7 pg (ref 26.0–34.0)
MCHC: 32.8 g/dL (ref 32.0–36.0)
MCV: 88 fL (ref 80–100)
Platelet: 208 10*3/uL (ref 150–440)
RBC: 5.42 10*6/uL — ABNORMAL HIGH (ref 3.80–5.20)
RDW: 15.2 % — AB (ref 11.5–14.5)
WBC: 10.9 10*3/uL (ref 3.6–11.0)

## 2013-06-30 NOTE — Telephone Encounter (Signed)
Patient Nosebleed stated before 3 pm , Nurse packing with gauze for last thirty minutes and nose bleed has not stopped has slowed the bleeding, patient stated she has not taken xarelto 20 mg for two years advised nurse after checking chart to check meds and xarelto 20 mg just filled showed patient the pill and she stated she has been taking medication. Spoke with Dr.Tullo and advised nurse patient needs to go to ER.

## 2013-07-15 ENCOUNTER — Encounter (INDEPENDENT_AMBULATORY_CARE_PROVIDER_SITE_OTHER): Payer: Self-pay

## 2013-07-15 ENCOUNTER — Ambulatory Visit (INDEPENDENT_AMBULATORY_CARE_PROVIDER_SITE_OTHER): Payer: Medicare Other | Admitting: Internal Medicine

## 2013-07-15 ENCOUNTER — Encounter: Payer: Self-pay | Admitting: Internal Medicine

## 2013-07-15 VITALS — BP 132/70 | HR 55 | Temp 97.5°F | Resp 16 | Wt 128.8 lb

## 2013-07-15 DIAGNOSIS — R609 Edema, unspecified: Secondary | ICD-10-CM

## 2013-07-15 DIAGNOSIS — N760 Acute vaginitis: Secondary | ICD-10-CM

## 2013-07-15 DIAGNOSIS — Z23 Encounter for immunization: Secondary | ICD-10-CM

## 2013-07-15 DIAGNOSIS — I872 Venous insufficiency (chronic) (peripheral): Secondary | ICD-10-CM

## 2013-07-15 DIAGNOSIS — L719 Rosacea, unspecified: Secondary | ICD-10-CM

## 2013-07-15 DIAGNOSIS — L710 Perioral dermatitis: Secondary | ICD-10-CM

## 2013-07-15 DIAGNOSIS — R42 Dizziness and giddiness: Secondary | ICD-10-CM

## 2013-07-15 DIAGNOSIS — Z79899 Other long term (current) drug therapy: Secondary | ICD-10-CM

## 2013-07-15 LAB — COMPREHENSIVE METABOLIC PANEL
ALBUMIN: 3.8 g/dL (ref 3.5–5.2)
ALT: 16 U/L (ref 0–35)
AST: 25 U/L (ref 0–37)
Alkaline Phosphatase: 62 U/L (ref 39–117)
BUN: 26 mg/dL — ABNORMAL HIGH (ref 6–23)
CHLORIDE: 110 meq/L (ref 96–112)
CO2: 22 mEq/L (ref 19–32)
Calcium: 9.7 mg/dL (ref 8.4–10.5)
Creatinine, Ser: 0.9 mg/dL (ref 0.4–1.2)
GFR: 60.38 mL/min (ref >60.00)
GLUCOSE: 99 mg/dL (ref 70–99)
Potassium: 4.2 mEq/L (ref 3.5–5.1)
Sodium: 140 mEq/L (ref 135–145)
Total Bilirubin: 1.1 mg/dL (ref 0.3–1.2)
Total Protein: 7.2 g/dL (ref 6.0–8.3)

## 2013-07-15 MED ORDER — TRIAMCINOLONE ACETONIDE 0.5 % EX OINT: 1.0000 "application " | TOPICAL_OINTMENT | Freq: Two times a day (BID) | CUTANEOUS | Status: DC

## 2013-07-15 MED ORDER — CLOBETASOL PROPIONATE 0.05 % EX OINT: 1.0000 "application " | TOPICAL_OINTMENT | Freq: Two times a day (BID) | CUTANEOUS | Status: DC

## 2013-07-15 NOTE — Patient Instructions (Addendum)
  I am prescribing triamcinolone ointment for your facial rash .  Useit twice daily  until rash improves.   I am prescribing clobetasol ointment for your vaginal itching Use it nightly  for 4 weeks,  Then stop  Ask Dr Rockey Situ about changing your Xarelto to Pradaxa  To save you money Use the 20 mg samples I have given you until then. If  you run out before you see him, use the 15 mg samples   Your dizziness may be due to the felodipine. Please suspend it for one week to see if the dizziness resolves   Your leg swelling is due to venous insufficiency.  This can only be treated with daily use of compression knee highs . Please wear your stocking on the right

## 2013-07-15 NOTE — Progress Notes (Signed)
Pre-visit discussion using our clinic review tool. No additional management support is needed unless otherwise documented below in the visit note.  

## 2013-07-15 NOTE — Progress Notes (Signed)
Patient ID: Donna Rosario, female   DOB: [birth year], 80 y.o.   MRN: [id number]  Patient Active Problem List   Diagnosis Date Noted  . POD (perioral dermatitis) 07/16/2013  . Chest pain 02/11/2013  . Vaginitis and vulvovaginitis 01/30/2013  . Pityriasis rosea-like drug eruption 09/26/2012  . Allergic urticaria 09/23/2012  . Dizziness 04/09/2012  . Insomnia 10/31/2011  . Venous insufficiency of leg 03/11/2011  . Dementia 03/09/2011  . DIASTOLIC DYSFUNCTION 30/02/2329  . HYPERTENSION, BENIGN 03/30/2010  . ATRIAL FIBRILLATION 03/30/2010  . Edema 03/30/2010    Subjective:  CC:   Chief Complaint  Patient presents with  . Follow-up    To discuss medications    HPI:   Donna Rosario a 80 y.o. female who presents with the chief complaint of : "I have problems"  1) peri oral and perinasal itching, worse at night.  Takes makeup off at night with some [facility] product she can't name but reports that it is not new..  Doesn't wear makeup daily so doesn't think its the makeup remover.  Uses Aveeno moisturizer after her daily morning shower .    2)   Also having vaginal itching that she is treating with OTC 1% hydrocortisone cream without resolution  3) medication Xarelto is costing her $150 /month.  Used to be $50/month ,  But has been becoming more expensive with every refill.  Wants an alternative.   4) history of falls ,  last one in October while walking to mailbox.  She fell in the ditch separating the yard from the road.   5) Recurrent episodes of dizziness,  Occurring mostly in the morning , lasting about 90 minutes, occurs  only with upright position.  She exercises 5 mornings per week, which has helped, but dizziness recurs after her exercise programs when she changes position   6) RLE swelling . She has a scar from a venous ulcer, does not hurt ,occurred after having hip surgery on the right   Past Medical History  Diagnosis Date  . Chronic low back pain     due to lumbosacral  degenerative joint disease  . Hypertension   . Arthritis   . Osteoporosis   . Atrial fibrillation Sept. 2011    [facility] Admission for RVR  . Thyroid disease     Hypothyroidism  . Disturbance of skin sensation   . Hyperlipidemia   . Mitral valve disorders   . Sciatica   . Vascular dementia, uncomplicated     Past Surgical History  Procedure Laterality Date  . Knee surgery      Left knee  . Thyroidectomy    . Total hip arthroplasty  2011  . Joint replacement      Right Hip Oct 2011,  Left Knee 2008 ([facility])       The following portions of the patient's history were reviewed and updated as appropriate: Allergies, current medications, and problem list.    Review of Systems:   12 Pt  review of systems was negative except those addressed in the HPI,     History   Social History  . Marital Status: Married    Spouse Name: N/A    Number of Children: N/A  . Years of Education: N/A   Occupational History  . Not on file.   Social History Main Topics  . Smoking status: Never Smoker   . Smokeless tobacco: Never Used  . Alcohol Use: 3.0 oz/week    5 Glasses of wine per week  Comment: occasional  . Drug Use: No  . Sexual Activity: No   Other Topics Concern  . Not on file   Social History Narrative  . No narrative on file    Objective:  Filed Vitals:   07/15/13 0804  BP: 132/70  Pulse: 55  Temp: 97.5 F (36.4 C)  Resp: 16     General appearance: alert, cooperative and appears stated age Ears: normal TM's and external ear canals both ears Throat: lips, mucosa, and tongue normal; teeth and gums normal Neck: no adenopathy, no carotid bruit, supple, symmetrical, trachea midline and thyroid not enlarged, symmetric, no tenderness/mass/nodules Back: symmetric, no curvature. ROM normal. No CVA tenderness. Lungs: clear to auscultation bilaterally Heart: regular rate and rhythm, S1, S2 normal, no murmur, click, rub or gallop Abdomen: soft, non-tender; bowel  sounds normal; no masses,  no organomegaly Pulses: 2+ and symmetric Skin: macular patchy rash around mouth and nose, sparing the nasolabial folds Perineal area red  And irritated without rash Lymph nodes: Cervical, supraclavicular, and axillary nodes normal.  Assessment and Plan:  Edema Asymmetric, secondary to venous insufficiency caused by prior hip surgery.  Recommended use of compression stockings, which she already has .   Vaginitis and vulvovaginitis Her perineal area is irritated.  Beclomethasone ointment nightly for 4 weeks. Then suspend  Dizziness Chronic, recurrent. With recent history of fall.  Neurolgoic exam is normal except for dementia. She has no interest in PT at this time.  Suggested suspending nifedipine for a week to see if symptoms resolve.     POD (perioral dermatitis) Trial of triamcinolone cream bid.  If no improvement in two weeks will try abx    Updated Medication List Outpatient Encounter Prescriptions as of 07/15/2013  Medication Sig  . alendronate (FOSAMAX) 70 MG tablet TAKE 1 TABLET ONCE WEEKLY--TAKE WITH FULL GLASS OF WATER ON EMPTY STOMACH  . ARMOUR THYROID 60 MG tablet TAKE 1 TABLET (60 MG TOTAL) BY MOUTH DAILY.  Marland Kitchen Ascorbic Acid (VITAMIN C) 500 MG tablet Take 500 mg by mouth daily.    . Calcium Carbonate-Vit D-Min (CALCIUM 1200 PO) Take 1 capsule by mouth daily.    . digoxin (LANOXIN) 0.125 MG tablet TAKE 1 TABLET BY MOUTH EVERY DAY  . felodipine (PLENDIL) 2.5 MG 24 hr tablet TAKE 1 TABLET BY MOUTH EVERY DAY  . fish oil-omega-3 fatty acids 1000 MG capsule Take 1 g by mouth daily.    . furosemide (LASIX) 40 MG tablet TAKE 1 TABLET BY MOUTH 2 TIMES DAILY.  Marland Kitchen GLUCOSAMINE-CHONDROITIN-VIT D3 PO Take 1 tablet by mouth daily.    Marland Kitchen L-Lysine 500 MG TABS Take 1 tablet by mouth daily.    . metoprolol (LOPRESSOR) 50 MG tablet TAKE 1 AND 1/2 TABLETS BY MOUTH TWICE A DAY AS DIRECTED  . potassium chloride SA (KLOR-CON M20) 20 MEQ tablet Take 1 tablet (20 mEq  total) by mouth daily.  . Rivaroxaban (XARELTO) 20 MG TABS Take 1 tablet (20 mg total) by mouth daily.  . Selenium 200 MCG TABS Take 1 tablet by mouth daily.    . traZODone (DESYREL) 50 MG tablet Take 1.5 tablets (75 mg total) by mouth at bedtime.  . [DISCONTINUED] Triamcinolone Acetonide (TRIAMCINOLONE 0.1 % CREAM : EUCERIN) CREA Apply 1 application topically 2 (two) times daily as needed.  . clobetasol ointment (TEMOVATE) 5.95 % Apply 1 application topically 2 (two) times daily. To vaginal area nightly for 4 weeks  . triamcinolone ointment (KENALOG) 0.5 % Apply 1 application topically 2 (  two) times daily. To facial rash until resolved  . [DISCONTINUED] diphenhydramine-acetaminophen (TYLENOL PM) 25-500 MG TABS Take 1 tablet by mouth at bedtime as needed.    . [DISCONTINUED] metoprolol (LOPRESSOR) 50 MG tablet Take 1 and 1/2 tablet twice daily.  . [DISCONTINUED] montelukast (SINGULAIR) 10 MG tablet Take 1 tablet (10 mg total) by mouth at bedtime.     Orders Placed This Encounter  Procedures  . DME Other see comment  . Pneumococcal conjugate vaccine 13-valent  . Comp Met (CMET)    No Follow-up on file.

## 2013-07-16 ENCOUNTER — Encounter: Payer: Self-pay | Admitting: Internal Medicine

## 2013-07-16 DIAGNOSIS — R42 Dizziness and giddiness: Secondary | ICD-10-CM | POA: Insufficient documentation

## 2013-07-16 DIAGNOSIS — L710 Perioral dermatitis: Secondary | ICD-10-CM | POA: Insufficient documentation

## 2013-07-16 NOTE — Assessment & Plan Note (Signed)
Her perineal area is irritated.  Beclomethasone ointment nightly for 4 weeks. Then suspend

## 2013-07-16 NOTE — Assessment & Plan Note (Signed)
Trial of triamcinolone cream bid.  If no improvement in two weeks will try abx

## 2013-07-16 NOTE — Assessment & Plan Note (Signed)
Chronic, recurrent. With recent history of fall.  Neurolgoic exam is normal except for dementia. She has no interest in PT at this time.  Suggested suspending nifedipine for a week to see if symptoms resolve.

## 2013-07-16 NOTE — Assessment & Plan Note (Signed)
Asymmetric, secondary to venous insufficiency caused by prior hip surgery.  Recommended use of compression stockings, which she already has .  

## 2013-07-17 ENCOUNTER — Encounter: Payer: Self-pay | Admitting: *Deleted

## 2013-07-20 ENCOUNTER — Other Ambulatory Visit: Payer: Self-pay | Admitting: Internal Medicine

## 2013-08-10 ENCOUNTER — Encounter: Payer: Self-pay | Admitting: Cardiovascular Disease

## 2013-08-10 ENCOUNTER — Ambulatory Visit (INDEPENDENT_AMBULATORY_CARE_PROVIDER_SITE_OTHER): Payer: Medicare Other | Admitting: Cardiovascular Disease

## 2013-08-10 VITALS — BP 140/84 | HR 59 | Ht 63.0 in | Wt 127.0 lb

## 2013-08-10 DIAGNOSIS — I4891 Unspecified atrial fibrillation: Secondary | ICD-10-CM

## 2013-08-10 DIAGNOSIS — I509 Heart failure, unspecified: Secondary | ICD-10-CM

## 2013-08-10 DIAGNOSIS — I10 Essential (primary) hypertension: Secondary | ICD-10-CM

## 2013-08-10 DIAGNOSIS — I5032 Chronic diastolic (congestive) heart failure: Secondary | ICD-10-CM | POA: Insufficient documentation

## 2013-08-10 MED ORDER — FELODIPINE ER 2.5 MG PO TB24: 2.5000 mg | ORAL_TABLET | Freq: Every day | ORAL | Status: DC

## 2013-08-10 MED ORDER — METOPROLOL TARTRATE 50 MG PO TABS: 50.0000 mg | ORAL_TABLET | Freq: Two times a day (BID) | ORAL | Status: DC

## 2013-08-10 MED ORDER — DABIGATRAN ETEXILATE MESYLATE 150 MG PO CAPS: 150.0000 mg | ORAL_CAPSULE | Freq: Two times a day (BID) | ORAL | Status: DC

## 2013-08-10 NOTE — Assessment & Plan Note (Signed)
Blood pressure is well controlled on today's visit. No changes made to the medications. 

## 2013-08-10 NOTE — Assessment & Plan Note (Signed)
Chronic atrial fibrillation, rate borderline low. We have suggested that she decrease the metoprolol back to 50 mg twice a day. Empty prescription Bottle with her today suggest she is taking 75 mg twice a day though she is unclear even with the bottle in front of her. We will try to change to pradaxa to see if this is more affordable than xarelto.

## 2013-08-10 NOTE — Patient Instructions (Addendum)
You are doing well. When you run out of the xarelto, Start the pradaxa twice a day (not once a day like the xarelto)  Please decrease the metoprolol down to 50 mg twice a day  Please call us if you have new issues that need to be addressed before your next appt.  Your physician wants you to follow-up in: 6 months.  You will receive a reminder letter in the mail two months in advance. If you don't receive a letter, please call our office to schedule the follow-up appointment.

## 2013-08-10 NOTE — Progress Notes (Signed)
Patient ID: Donna Rosario, female    DOB: 1923/03/20, 80 y.o.   MRN: [id number]  HPI Comments: Donna Rosario is a 80 year old woman, patient of Dr. Derrel Nip, with history of  sciatic and back pain, "crushed right hip", s/p right hip replacement,  who was admitted to the hospital in early September 2011 with atrial fibrillation, started on warfarin and rate control, underlying mild dementia and confusion with polypharmacy, taken off warfarin secondary to inability to manage this herself,  previously started on Pradaxa b.i.d.  We did have 2  nursing agencies try to establish a relationship with her for polypharmacy. She  discharged them.  Mild chronic lower extremity edema. Significant confusion with her medications.  In followup today, she reports that she is doing well. She is unable to afford her xarelto and requesting pradaxa. Also requesting refills on her felodipine and metoprolol. She is uncertain how much metoprolol she is taking. Prescription bottle with her today suggest 75 mg twice a day. She is unsure. She exercises 5 days per week, limited by occasional hip pain on the right. No significant chest pain. She reports that she is taking her diuretics daily, sometimes twice a day In the past, she drank a significant amount of soda/Pepsi.  She's had falls in the past. Mostly mechanical falls. Reports her balance is not good.  EKG shows atrial fibrillation with ventricular rate of 59 beats per minute, no significant ST or T wave changes  Previous echocardiogram shows normal systolic function, ejection fraction 55%, mild LVH with diastolic dysfunction, mildly dilated left atrium, mild to moderate MR, moderate TR with elevated right ventricular systolic pressures consistent with mild pulmonary hypertension   Outpatient Encounter Prescriptions as of 08/10/2013  Medication Sig  . alendronate (FOSAMAX) 70 MG tablet TAKE 1 TABLET ONCE WEEKLY--TAKE WITH FULL GLASS OF WATER ON EMPTY STOMACH  . ARMOUR THYROID  60 MG tablet TAKE 1 TABLET (60 MG TOTAL) BY MOUTH DAILY.  Marland Kitchen Ascorbic Acid (VITAMIN C) 500 MG tablet Take 500 mg by mouth daily.    . Calcium Carbonate-Vit D-Min (CALCIUM 1200 PO) Take 1 capsule by mouth daily.    . clobetasol ointment (TEMOVATE) 4.09 % Apply 1 application topically 2 (two) times daily. To vaginal area nightly for 4 weeks  . digoxin (LANOXIN) 0.125 MG tablet TAKE 1 TABLET BY MOUTH EVERY DAY  . felodipine (PLENDIL) 2.5 MG 24 hr tablet TAKE 1 TABLET BY MOUTH EVERY DAY  . fish oil-omega-3 fatty acids 1000 MG capsule Take 1 g by mouth daily.    . furosemide (LASIX) 40 MG tablet TAKE 1 TABLET BY MOUTH 2 TIMES DAILY.  Marland Kitchen GLUCOSAMINE-CHONDROITIN-VIT D3 PO Take 1 tablet by mouth daily.    Marland Kitchen L-Lysine 500 MG TABS Take 1 tablet by mouth daily.    . metoprolol (LOPRESSOR) 50 MG tablet TAKE 1 AND 1/2 TABLETS BY MOUTH TWICE A DAY AS DIRECTED  . potassium chloride SA (KLOR-CON M20) 20 MEQ tablet Take 1 tablet (20 mEq total) by mouth daily.  . Rivaroxaban (XARELTO) 20 MG TABS Take 1 tablet (20 mg total) by mouth daily.  . Selenium 200 MCG TABS Take 1 tablet by mouth daily.    . traZODone (DESYREL) 50 MG tablet Take 1.5 tablets (75 mg total) by mouth at bedtime.  . triamcinolone ointment (KENALOG) 0.5 % Apply 1 application topically 2 (two) times daily. To facial rash until resolved    Review of Systems  Constitutional: Negative.   HENT: Negative.   Eyes: Negative.  Respiratory: Negative.   Cardiovascular: Negative.   Gastrointestinal: Negative.   Endocrine: Negative.   Musculoskeletal: Positive for arthralgias and gait problem.  Skin: Negative.   Allergic/Immunologic: Negative.   Neurological: Negative.   Hematological: Negative.   Psychiatric/Behavioral: Negative.   All other systems reviewed and are negative.   BP 140/84  Pulse 59  Ht 5\' 3"  (1.6 m)  Wt 127 lb (57.607 kg)  BMI 22.50 kg/m2  Physical Exam  Nursing note and vitals reviewed. Constitutional: She is oriented to  person, place, and time. She appears well-developed and well-nourished.  HENT:  Head: Normocephalic.  Nose: Nose normal.  Mouth/Throat: Oropharynx is clear and moist.  Eyes: Conjunctivae are normal. Pupils are equal, round, and reactive to light.  Neck: Normal range of motion. Neck supple. No JVD present.  Cardiovascular: Normal rate, S1 normal, S2 normal, normal heart sounds and intact distal pulses.  An irregularly irregular rhythm present. Exam reveals no gallop and no friction rub.   No murmur heard. Trace ankle edema  Pulmonary/Chest: Effort normal and breath sounds normal. No respiratory distress. She has no wheezes. She has no rales. She exhibits no tenderness.  Abdominal: Soft. Bowel sounds are normal. She exhibits no distension. There is no tenderness.  Musculoskeletal: Normal range of motion. She exhibits no edema and no tenderness.  Lymphadenopathy:    She has no cervical adenopathy.  Neurological: She is alert and oriented to person, place, and time. Coordination normal.  Skin: Skin is warm and dry. No rash noted. No erythema.  Psychiatric: She has a normal mood and affect. Her behavior is normal. Judgment and thought content normal.    Assessment and Plan

## 2013-08-10 NOTE — Assessment & Plan Note (Signed)
Recommended that she stay on her current diuretics. She does drink significant sodas still. Clinical exam is essentially benign today

## 2013-08-24 ENCOUNTER — Other Ambulatory Visit: Payer: Self-pay | Admitting: Internal Medicine

## 2013-10-05 ENCOUNTER — Telehealth: Payer: Self-pay | Admitting: Internal Medicine

## 2013-10-05 NOTE — Telephone Encounter (Signed)
Please advise patient need to stay with Pradaxa

## 2013-10-05 NOTE — Telephone Encounter (Signed)
We could try xarelto.  Does she want to check with her pharmacist on the cost?

## 2013-10-05 NOTE — Telephone Encounter (Signed)
Please advise 

## 2013-10-05 NOTE — Telephone Encounter (Signed)
Pt came in today wanting to let dr Derrel Nip know her med pradaxa  She stated this med is @$ 94 for 30 pills.  Pt wanted to know if there was something else she could take that would cost less. [facility] Pt has a few pills left (4)

## 2013-10-05 NOTE — Telephone Encounter (Signed)
Left vm with information needed:  Xarelto cost $379.99 for 10 mg x 1 month.

## 2013-10-07 NOTE — Telephone Encounter (Signed)
Patient came into office and was advised Pradaxa the better choice but that maybe shopping around would be a better choice. Patient is going to call other pharmacies and compare price.

## 2013-10-12 ENCOUNTER — Telehealth: Payer: Self-pay | Admitting: Internal Medicine

## 2013-10-12 ENCOUNTER — Other Ambulatory Visit: Payer: Self-pay | Admitting: Internal Medicine

## 2013-10-12 MED ORDER — DABIGATRAN ETEXILATE MESYLATE 150 MG PO CAPS: 150.0000 mg | ORAL_CAPSULE | Freq: Two times a day (BID) | ORAL | Status: DC

## 2013-10-12 NOTE — Telephone Encounter (Signed)
Pt came to office.  States she has found two meds that may help her:  Eliquis (cheaper) #30 capsules which is a new med Or  Pradaxa #30 capsules 75 mg.    Would like Dr. Derrel Nip to decide which one and call her.  Copy also made of paper with this information the pt brought in; placed in Dr. Lupita Dawn box.

## 2013-10-12 NOTE — Telephone Encounter (Signed)
pradaxa is taken twice daily, not once daily; and her dose was 150 mg twice daily per Dr Donivan Scull note from February.  I have refiled the pradaxa t the appropriate dose.   She needs to stop the eliquis one day priro to starting the pradaxa

## 2013-10-12 NOTE — Telephone Encounter (Signed)
Patient verbalized understanding and reassured that instructions were understood but patient could not repeat directions back but still voiced understanding.

## 2013-10-13 NOTE — Telephone Encounter (Signed)
Patient had mammogram done on 10/02/11.

## 2013-12-14 ENCOUNTER — Ambulatory Visit (INDEPENDENT_AMBULATORY_CARE_PROVIDER_SITE_OTHER): Payer: Medicare Other | Admitting: Internal Medicine

## 2013-12-14 ENCOUNTER — Encounter: Payer: Self-pay | Admitting: Internal Medicine

## 2013-12-14 VITALS — BP 146/80 | HR 74 | Temp 97.5°F | Resp 16 | Ht 63.0 in | Wt 125.5 lb

## 2013-12-14 DIAGNOSIS — I872 Venous insufficiency (chronic) (peripheral): Secondary | ICD-10-CM

## 2013-12-14 DIAGNOSIS — D492 Neoplasm of unspecified behavior of bone, soft tissue, and skin: Secondary | ICD-10-CM

## 2013-12-14 DIAGNOSIS — C44319 Basal cell carcinoma of skin of other parts of face: Secondary | ICD-10-CM

## 2013-12-14 DIAGNOSIS — I10 Essential (primary) hypertension: Secondary | ICD-10-CM

## 2013-12-14 NOTE — Patient Instructions (Addendum)
I am referring you to Dr Kellie Moor to evaluate the skin lesion on your temple  You will need to suspend your Pradaxa 5 days prior to your deramtology appt  so she can biopsy it without excessive bleeding   Your blood pressure is well controlled   You need to wear your compression stockings when you travel to prevent leg swelling   Please return in late July with your son  For a checkup .  You will be due for fasting labs

## 2013-12-14 NOTE — Progress Notes (Signed)
Pre-visit discussion using our clinic review tool. No additional management support is needed unless otherwise documented below in the visit note.  

## 2013-12-14 NOTE — Progress Notes (Signed)
Patient ID: Donna Rosario, female   DOB: 10/26/1922, 80 y.o.   MRN: [id number]   Patient Active Problem List   Diagnosis Date Noted  . Basal cell carcinoma of temple region 12/15/2013  . Chronic diastolic CHF (congestive heart failure) 08/10/2013  . POD (perioral dermatitis) 07/16/2013  . Chest pain 02/11/2013  . Vaginitis and vulvovaginitis 01/30/2013  . Pityriasis rosea-like drug eruption 09/26/2012  . Allergic urticaria 09/23/2012  . Dizziness 04/09/2012  . Insomnia 10/31/2011  . Venous insufficiency of leg 03/11/2011  . Dementia 03/09/2011  . DIASTOLIC DYSFUNCTION 26/94/8546  . HYPERTENSION, BENIGN 03/30/2010  . ATRIAL FIBRILLATION 03/30/2010  . Edema 03/30/2010    Subjective:  CC:   Chief Complaint  Patient presents with  . Acute Visit    for spots on face concerned they maybe cancer.    HPI:   Donna Rosario is a 80 y.o. female who presents for Follow up on chronic conditions including atrial fibrillation, hypertension, allergic urticaria, and dementia    She has a new skin lesion on right temple., worried it is cancer  ,  Has been applying betamethasone ointment to it, and now has telangiectsias on  Skin surroundng the area.  She has had prior skin cancers taken off but does not remember the name of the dermatologist at [place] that she saw, but does not want to see her again.   She is going to [place] tomorrow to see her children ,  Son is driving her back and gong to stay with her. Will be gone until mid July    Past Medical History  Diagnosis Date  . Chronic low back pain     due to lumbosacral degenerative joint disease  . Hypertension   . Arthritis   . Osteoporosis   . Atrial fibrillation Sept. 2011    [facility] Admission for RVR  . Thyroid disease     Hypothyroidism  . Disturbance of skin sensation   . Hyperlipidemia   . Mitral valve disorders   . Sciatica   . Vascular dementia, uncomplicated     Past Surgical History  Procedure Laterality Date  . Knee  surgery      Left knee  . Thyroidectomy    . Total hip arthroplasty  2011  . Joint replacement      Right Hip Oct 2011,  Left Knee 2008 ([facility])       The following portions of the patient's history were reviewed and updated as appropriate: Allergies, current medications, and problem list.    Review of Systems:   Patient denies headache, fevers, malaise, unintentional weight loss, skin rash, eye pain, sinus congestion and sinus pain, sore throat, dysphagia,  hemoptysis , cough, dyspnea, wheezing, chest pain, palpitations, orthopnea, edema, abdominal pain, nausea, melena, diarrhea, constipation, flank pain, dysuria, hematuria, urinary  Frequency, nocturia, numbness, tingling, seizures,  Focal weakness, Loss of consciousness,  Tremor, insomnia, depression, anxiety, and suicidal ideation.     History   Social History  . Marital Status: Married    Spouse Name: N/A    Number of Children: N/A  . Years of Education: N/A   Occupational History  . Not on file.   Social History Main Topics  . Smoking status: Never Smoker   . Smokeless tobacco: Never Used  . Alcohol Use: 3.0 oz/week    5 Glasses of wine per week     Comment: occasional  . Drug Use: No  . Sexual Activity: No   Other Topics Concern  .  Not on file   Social History Narrative  . No narrative on file    Objective:  Filed Vitals:   12/14/13 1444  BP: 146/80  Pulse: 74  Temp: 97.5 F (36.4 C)  Resp: 16     General appearance: alert, cooperative and appears stated age Ears: normal TM's and external ear canals both ears Throat: lips, mucosa, and tongue normal; teeth and gums normal Neck: no adenopathy, no carotid bruit, supple, symmetrical, trachea midline and thyroid not enlarged, symmetric, no tenderness/mass/nodules Back: symmetric, no curvature. ROM normal. No CVA tenderness. Lungs: clear to auscultation bilaterally Heart: regular rate and rhythm, S1, S2 normal, no murmur, click, rub or  gallop Abdomen: soft, non-tender; bowel sounds normal; no masses,  no organomegaly Pulses: 2+ and symmetric Skin: Skin color, texture, turgor normal. No rashes or lesions Lymph nodes: Cervical, supraclavicular, and axillary nodes normal.  Assessment and Plan:  Basal cell carcinoma of temple region Referral to Dr Kellie Moor for biopsy   Venous insufficiency of leg Mild venous stasis changes explained to patient again. Advised to wear compression stockings during her trip.l    HYPERTENSION, BENIGN Well controlled on current regimen. Renal function stable, no changes today.   Updated Medication List Outpatient Encounter Prescriptions as of 12/14/2013  Medication Sig  . alendronate (FOSAMAX) 70 MG tablet TAKE 1 TABLET ONCE WEEKLY--TAKE WITH FULL GLASS OF WATER ON EMPTY STOMACH  . ARMOUR THYROID 60 MG tablet TAKE 1 TABLET (60 MG TOTAL) BY MOUTH DAILY.  Marland Kitchen Ascorbic Acid (VITAMIN C) 500 MG tablet Take 500 mg by mouth daily.    . Calcium Carbonate-Vit D-Min (CALCIUM 1200 PO) Take 1 capsule by mouth daily.    . clobetasol ointment (TEMOVATE) 1.51 % Apply 1 application topically 2 (two) times daily. To vaginal area nightly for 4 weeks  . dabigatran (PRADAXA) 150 MG CAPS capsule Take 1 capsule (150 mg total) by mouth 2 (two) times daily.  . digoxin (LANOXIN) 0.125 MG tablet TAKE 1 TABLET BY MOUTH EVERY DAY  . digoxin (LANOXIN) 0.125 MG tablet TAKE 1 TABLET BY MOUTH EVERY DAY  . felodipine (PLENDIL) 2.5 MG 24 hr tablet Take 1 tablet (2.5 mg total) by mouth daily.  . fish oil-omega-3 fatty acids 1000 MG capsule Take 1 g by mouth daily.    . furosemide (LASIX) 40 MG tablet TAKE 1 TABLET BY MOUTH 2 TIMES DAILY.  Marland Kitchen GLUCOSAMINE-CHONDROITIN-VIT D3 PO Take 1 tablet by mouth daily.    Marland Kitchen L-Lysine 500 MG TABS Take 1 tablet by mouth daily.    . metoprolol (LOPRESSOR) 50 MG tablet Take 1 tablet (50 mg total) by mouth 2 (two) times daily.  . potassium chloride SA (KLOR-CON M20) 20 MEQ tablet Take 1 tablet (20  mEq total) by mouth daily.  . Selenium 200 MCG TABS Take 1 tablet by mouth daily.    . traZODone (DESYREL) 50 MG tablet Take 1.5 tablets (75 mg total) by mouth at bedtime.  . triamcinolone ointment (KENALOG) 0.5 % Apply 1 application topically 2 (two) times daily. To facial rash until resolved     Orders Placed This Encounter  Procedures  . Ambulatory referral to Dermatology    Return in about 5 weeks (around 01/18/2014).

## 2013-12-15 ENCOUNTER — Encounter: Payer: Self-pay | Admitting: Internal Medicine

## 2013-12-15 DIAGNOSIS — C44319 Basal cell carcinoma of skin of other parts of face: Secondary | ICD-10-CM | POA: Insufficient documentation

## 2013-12-15 NOTE — Assessment & Plan Note (Signed)
Referral to Dr Kellie Moor for biopsy

## 2013-12-15 NOTE — Assessment & Plan Note (Signed)
Mild venous stasis changes explained to patient again. Advised to wear compression stockings during her trip.l

## 2013-12-15 NOTE — Assessment & Plan Note (Signed)
Well controlled on current regimen. Renal function stable, no changes today. 

## 2013-12-30 ENCOUNTER — Encounter: Payer: Self-pay | Admitting: Internal Medicine

## 2014-01-12 ENCOUNTER — Encounter: Payer: Self-pay | Admitting: Internal Medicine

## 2014-01-12 ENCOUNTER — Ambulatory Visit (INDEPENDENT_AMBULATORY_CARE_PROVIDER_SITE_OTHER): Payer: Medicare Other | Admitting: Internal Medicine

## 2014-01-12 VITALS — BP 128/60 | HR 67 | Temp 97.6°F | Resp 16 | Ht 63.0 in | Wt 128.5 lb

## 2014-01-12 DIAGNOSIS — I482 Chronic atrial fibrillation, unspecified: Secondary | ICD-10-CM

## 2014-01-12 DIAGNOSIS — I4891 Unspecified atrial fibrillation: Secondary | ICD-10-CM

## 2014-01-12 DIAGNOSIS — E785 Hyperlipidemia, unspecified: Secondary | ICD-10-CM

## 2014-01-12 DIAGNOSIS — F0390 Unspecified dementia without behavioral disturbance: Secondary | ICD-10-CM

## 2014-01-12 DIAGNOSIS — Z79899 Other long term (current) drug therapy: Secondary | ICD-10-CM

## 2014-01-12 DIAGNOSIS — C44319 Basal cell carcinoma of skin of other parts of face: Secondary | ICD-10-CM

## 2014-01-12 DIAGNOSIS — Z7901 Long term (current) use of anticoagulants: Secondary | ICD-10-CM

## 2014-01-12 LAB — COMPREHENSIVE METABOLIC PANEL
ALT: 19 U/L (ref 0–35)
AST: 25 U/L (ref 0–37)
Albumin: 3.7 g/dL (ref 3.5–5.2)
Alkaline Phosphatase: 52 U/L (ref 39–117)
BILIRUBIN TOTAL: 1 mg/dL (ref 0.2–1.2)
BUN: 22 mg/dL (ref 6–23)
CALCIUM: 9.6 mg/dL (ref 8.4–10.5)
CHLORIDE: 107 meq/L (ref 96–112)
CO2: 25 meq/L (ref 19–32)
Creatinine, Ser: 1 mg/dL (ref 0.4–1.2)
GFR: 54.21 mL/min — AB (ref >60.00)
Glucose, Bld: 94 mg/dL (ref 70–99)
Potassium: 4.2 mEq/L (ref 3.5–5.1)
SODIUM: 141 meq/L (ref 135–145)
TOTAL PROTEIN: 6.4 g/dL (ref 6.0–8.3)

## 2014-01-12 LAB — CBC WITH DIFFERENTIAL/PLATELET
Basophils Absolute: 0 10*3/uL (ref 0.0–0.1)
Basophils Relative: 0.3 % (ref 0.0–3.0)
Eosinophils Absolute: 0.1 10*3/uL (ref 0.0–0.7)
Eosinophils Relative: 0.7 % (ref 0.0–5.0)
HCT: 44.1 % (ref 36.0–46.0)
Hemoglobin: 14.4 g/dL (ref 12.0–15.0)
LYMPHS ABS: 2.7 10*3/uL (ref 0.7–4.0)
Lymphocytes Relative: 26.1 % (ref 12.0–46.0)
MCHC: 32.6 g/dL (ref 30.0–36.0)
MCV: 90.1 fl (ref 78.0–100.0)
MONO ABS: 1 10*3/uL (ref 0.1–1.0)
Monocytes Relative: 9.5 % (ref 3.0–12.0)
NEUTROS PCT: 63.4 % (ref 43.0–77.0)
Neutro Abs: 6.5 10*3/uL (ref 1.4–7.7)
PLATELETS: 238 10*3/uL (ref 150.0–400.0)
RBC: 4.9 Mil/uL (ref 3.87–5.11)
RDW: 14.8 % (ref 11.5–15.5)
WBC: 10.2 10*3/uL (ref 4.0–10.5)

## 2014-01-12 LAB — LIPID PANEL
CHOL/HDL RATIO: 4
Cholesterol: 215 mg/dL — ABNORMAL HIGH (ref 0–200)
HDL: 54.7 mg/dL (ref >39.00)
LDL Cholesterol: 127 mg/dL — ABNORMAL HIGH (ref 0–99)
NonHDL: 160.3
TRIGLYCERIDES: 165 mg/dL — AB (ref 0.0–149.0)
VLDL: 33 mg/dL (ref 0.0–40.0)

## 2014-01-12 MED ORDER — LIDOCAINE HCL 2 % EX GEL: 1.0000 "application " | CUTANEOUS | Status: DC | PRN

## 2014-01-12 NOTE — Progress Notes (Signed)
Pre-visit discussion using our clinic review tool. No additional management support is needed unless otherwise documented below in the visit note.  

## 2014-01-12 NOTE — Patient Instructions (Addendum)
Stop using the triamcinolone and clobetasol creams on your temple rash because they are not helping   I am concerned that you have a skin Cancer .    I will call in some lidocaine cream to use for the burning  Your appt with the dermatologist,  Dr Evorn Gong is August 31  At 12:30  Dr Roosvelt Harps office is at the following address:  [address] [place], [place] [phone]

## 2014-01-12 NOTE — Progress Notes (Signed)
Patient ID: Donna Rosario, female   DOB: 1922/11/30, 80 y.o.   MRN: [id number]    Patient Active Problem List   Diagnosis Date Noted  . Basal cell carcinoma of temple region 12/15/2013  . Chronic diastolic CHF (congestive heart failure) 08/10/2013  . POD (perioral dermatitis) 07/16/2013  . Chest pain 02/11/2013  . Vaginitis and vulvovaginitis 01/30/2013  . Pityriasis rosea-like drug eruption 09/26/2012  . Allergic urticaria 09/23/2012  . Dizziness 04/09/2012  . Insomnia 10/31/2011  . Venous insufficiency of leg 03/11/2011  . Dementia 03/09/2011  . DIASTOLIC DYSFUNCTION 32/67/1245  . HYPERTENSION, BENIGN 03/30/2010  . ATRIAL FIBRILLATION 03/30/2010  . Edema 03/30/2010    Subjective:  CC:   Chief Complaint  Patient presents with  . Acute Visit    redden raised spot on right tempural lobe.    HPI:   Donna Rosario is a 80 y.o. female who presents for persistent vesicular lesion on her right temple which has been present for about two months.  No prior history of burn or trauma.  Has been itching,  Applying betamethasone cream to it daily  Since early June with no significant change but deferred referral until now due to travel .   It does not bleed  Or drain.   She has had prior skin cancers taken off but does not remember the name of the dermatologist at [place] that she saw, but does not want to see her again.     Past Medical History  Diagnosis Date  . Chronic low back pain     due to lumbosacral degenerative joint disease  . Hypertension   . Arthritis   . Osteoporosis   . Atrial fibrillation Sept. 2011    [facility] Admission for RVR  . Thyroid disease     Hypothyroidism  . Disturbance of skin sensation   . Hyperlipidemia   . Mitral valve disorders   . Sciatica   . Vascular dementia, uncomplicated     Past Surgical History  Procedure Laterality Date  . Knee surgery      Left knee  . Thyroidectomy    . Total hip arthroplasty  2011  . Joint replacement     Right Hip Oct 2011,  Left Knee 2008 ([facility])       The following portions of the patient's history were reviewed and updated as appropriate: Allergies, current medications, and problem list.    Review of Systems:   Patient denies headache, fevers, malaise, unintentional weight loss, skin rash, eye pain, sinus congestion and sinus pain, sore throat, dysphagia,  hemoptysis , cough, dyspnea, wheezing, chest pain, palpitations, orthopnea, edema, abdominal pain, nausea, melena, diarrhea, constipation, flank pain, dysuria, hematuria, urinary  Frequency, nocturia, numbness, tingling, seizures,  Focal weakness, Loss of consciousness,  Tremor, insomnia, depression, anxiety, and suicidal ideation.     History   Social History  . Marital Status: Married    Spouse Name: N/A    Number of Children: N/A  . Years of Education: N/A   Occupational History  . Not on file.   Social History Main Topics  . Smoking status: Never Smoker   . Smokeless tobacco: Never Used  . Alcohol Use: 3.0 oz/week    5 Glasses of wine per week     Comment: occasional  . Drug Use: No  . Sexual Activity: No   Other Topics Concern  . Not on file   Social History Narrative  . No narrative on file    Objective:  Filed  Vitals:   01/12/14 1322  BP: 128/60  Pulse: 67  Temp: 97.6 F (36.4 C)  Resp: 16     General appearance: alert, cooperative and appears stated age Ears: normal TM's and external ear canals both ears Throat: lips, mucosa, and tongue normal; teeth and gums normal Neck: no adenopathy, no carotid bruit, supple, symmetrical, trachea midline and thyroid not enlarged, symmetric, no tenderness/mass/nodules Back: symmetric, no curvature. ROM normal. No CVA tenderness. Lungs: clear to auscultation bilaterally Heart: regular rate and rhythm, S1, S2 normal, no murmur, click, rub or gallop Abdomen: soft, non-tender; bowel sounds normal; no masses,  no organomegaly Pulses: 2+ and symmetric Skin:  Skin color, texture, turgor normal. No rashes or lesions Lymph nodes: Cervical, supraclavicular, and axillary nodes normal.  Assessment and Plan:  Basal cell carcinoma of temple region Suspected, by appearance.  Right temple. Now requesting referral to new dermatologist.  Dr Kellie Moor, [facility] referral made.  She is anticoagulated with Prodaxa for chronic atrial fibrillation and this may need to be held prior to biopsy .  ATRIAL FIBRILLATION Well controlled on current medications.  No changes today.      Updated Medication List Outpatient Encounter Prescriptions as of 01/12/2014  Medication Sig  . alendronate (FOSAMAX) 70 MG tablet TAKE 1 TABLET ONCE WEEKLY--TAKE WITH FULL GLASS OF WATER ON EMPTY STOMACH  . ARMOUR THYROID 60 MG tablet TAKE 1 TABLET (60 MG TOTAL) BY MOUTH DAILY.  Marland Kitchen Ascorbic Acid (VITAMIN C) 500 MG tablet Take 500 mg by mouth daily.    . Calcium Carbonate-Vit D-Min (CALCIUM 1200 PO) Take 1 capsule by mouth daily.    . clobetasol ointment (TEMOVATE) 5.30 % Apply 1 application topically 2 (two) times daily. To vaginal area nightly for 4 weeks  . dabigatran (PRADAXA) 150 MG CAPS capsule Take 1 capsule (150 mg total) by mouth 2 (two) times daily.  . digoxin (LANOXIN) 0.125 MG tablet TAKE 1 TABLET BY MOUTH EVERY DAY  . felodipine (PLENDIL) 2.5 MG 24 hr tablet Take 1 tablet (2.5 mg total) by mouth daily.  . fish oil-omega-3 fatty acids 1000 MG capsule Take 1 g by mouth daily.    . furosemide (LASIX) 40 MG tablet TAKE 1 TABLET BY MOUTH 2 TIMES DAILY.  Marland Kitchen GLUCOSAMINE-CHONDROITIN-VIT D3 PO Take 1 tablet by mouth daily.    Marland Kitchen L-Lysine 500 MG TABS Take 1 tablet by mouth daily.    . metoprolol (LOPRESSOR) 50 MG tablet Take 1 tablet (50 mg total) by mouth 2 (two) times daily.  . potassium chloride SA (KLOR-CON M20) 20 MEQ tablet Take 1 tablet (20 mEq total) by mouth daily.  . Selenium 200 MCG TABS Take 1 tablet by mouth daily.    . traZODone (DESYREL) 50 MG tablet Take 1.5  tablets (75 mg total) by mouth at bedtime.  . [DISCONTINUED] triamcinolone ointment (KENALOG) 0.5 % Apply 1 application topically 2 (two) times daily. To facial rash until resolved  . lidocaine (XYLOCAINE JELLY) 2 % jelly Apply 1 application topically as needed. Apply twice daily as needed to skin lesion on temple  . [DISCONTINUED] digoxin (LANOXIN) 0.125 MG tablet TAKE 1 TABLET BY MOUTH EVERY DAY     Orders Placed This Encounter  Procedures  . Comp Met (CMET)  . Lipid panel  . T4 AND TSH  . CBC with Differential    No Follow-up on file.

## 2014-01-13 ENCOUNTER — Encounter: Payer: Self-pay | Admitting: *Deleted

## 2014-01-13 LAB — T4 AND TSH
T4 TOTAL: 5 ug/dL (ref 4.5–12.0)
TSH: 0.982 u[IU]/mL (ref 0.450–4.500)

## 2014-01-14 ENCOUNTER — Telehealth: Payer: Self-pay | Admitting: *Deleted

## 2014-01-14 NOTE — Assessment & Plan Note (Addendum)
Suspected, by appearance.  Right temple. Now requesting referral to new dermatologist.  Dr Kellie Moor, [facility] referral made.  She is anticoagulated with Prodaxa for chronic atrial fibrillation and this may need to be held prior to biopsy .

## 2014-01-14 NOTE — Telephone Encounter (Signed)
Pt is coming in tomorrow what labs and dx?  

## 2014-01-14 NOTE — Assessment & Plan Note (Signed)
Well controlled on current medications.  No changes today. 

## 2014-01-15 ENCOUNTER — Other Ambulatory Visit: Payer: Medicare Other

## 2014-01-15 NOTE — Telephone Encounter (Signed)
None, she had them all on July 21st

## 2014-01-19 ENCOUNTER — Ambulatory Visit: Payer: Medicare Other | Admitting: Internal Medicine

## 2014-01-27 ENCOUNTER — Other Ambulatory Visit: Payer: Self-pay | Admitting: Internal Medicine

## 2014-01-27 NOTE — Telephone Encounter (Signed)
Refill

## 2014-01-29 NOTE — Telephone Encounter (Signed)
Please advise Pharmacy called for refill ok to fill?

## 2014-02-01 NOTE — Telephone Encounter (Signed)
Ok to refill,  Refill sent  

## 2014-02-01 NOTE — Telephone Encounter (Signed)
Pt called in and left a message asking to refill Lidocaine.

## 2014-02-08 ENCOUNTER — Ambulatory Visit (INDEPENDENT_AMBULATORY_CARE_PROVIDER_SITE_OTHER): Payer: Medicare Other | Admitting: Cardiovascular Disease

## 2014-02-08 ENCOUNTER — Encounter: Payer: Self-pay | Admitting: Cardiovascular Disease

## 2014-02-08 VITALS — BP 120/60 | HR 66 | Ht 63.0 in | Wt 129.5 lb

## 2014-02-08 DIAGNOSIS — I509 Heart failure, unspecified: Secondary | ICD-10-CM

## 2014-02-08 DIAGNOSIS — R0789 Other chest pain: Secondary | ICD-10-CM

## 2014-02-08 DIAGNOSIS — I10 Essential (primary) hypertension: Secondary | ICD-10-CM

## 2014-02-08 DIAGNOSIS — I4891 Unspecified atrial fibrillation: Secondary | ICD-10-CM

## 2014-02-08 DIAGNOSIS — I5032 Chronic diastolic (congestive) heart failure: Secondary | ICD-10-CM

## 2014-02-08 NOTE — Progress Notes (Signed)
Patient ID: Donna Rosario, female    DOB: 01-25-1923, 80 y.o.   MRN: [id number]  HPI Comments: Donna Rosario is a 80 year old woman, patient of Dr. Derrel Nip, with history of  sciatic and back pain, "crushed right hip", s/p right hip replacement,  who was admitted to the hospital in early September 2011 with atrial fibrillation, started on warfarin and rate control, underlying mild dementia and confusion with polypharmacy, taken off warfarin secondary to inability to manage this herself,  previously started on Pradaxa b.i.d.  We did have 2  nursing agencies try to establish a relationship with her for polypharmacy. She  discharged them.  Mild chronic lower extremity edema. Significant confusion with her medications.  In followup today, she reports that she is doing well.   She is requesting pradaxa samples. She reports that she is active, exercises most days of the week. She does swimming, some walking but not much secondary to chronic back and hip pain. She denies any lower extremity edema No significant chest pain. She reports that she is taking her diuretics daily, sometimes twice a day In the past, she drank a significant amount of soda/Pepsi.  She's had falls in the past. Mostly mechanical falls. Reports her balance is not good.  EKG shows atrial fibrillation with ventricular rate of 66 beats per minute, no significant ST or T wave changes  Previous echocardiogram shows normal systolic function, ejection fraction 55%, mild LVH with diastolic dysfunction, mildly dilated left atrium, mild to moderate MR, moderate TR with elevated right ventricular systolic pressures consistent with mild pulmonary hypertension   Outpatient Encounter Prescriptions as of 02/08/2014  Medication Sig  . alendronate (FOSAMAX) 70 MG tablet TAKE 1 TABLET ONCE WEEKLY--TAKE WITH FULL GLASS OF WATER ON EMPTY STOMACH  . ARMOUR THYROID 60 MG tablet TAKE 1 TABLET (60 MG TOTAL) BY MOUTH DAILY.  Marland Kitchen Ascorbic Acid (VITAMIN C) 500 MG  tablet Take 500 mg by mouth daily.    . Calcium Carbonate-Vit D-Min (CALCIUM 1200 PO) Take 1 capsule by mouth daily.    . clobetasol ointment (TEMOVATE) 4.33 % Apply 1 application topically 2 (two) times daily. To vaginal area nightly for 4 weeks  . dabigatran (PRADAXA) 150 MG CAPS capsule Take 1 capsule (150 mg total) by mouth 2 (two) times daily.  . digoxin (LANOXIN) 0.125 MG tablet TAKE 1 TABLET BY MOUTH EVERY DAY  . felodipine (PLENDIL) 2.5 MG 24 hr tablet Take 1 tablet (2.5 mg total) by mouth daily.  . fish oil-omega-3 fatty acids 1000 MG capsule Take 1 g by mouth daily.    . furosemide (LASIX) 40 MG tablet TAKE 1 TABLET BY MOUTH 2 TIMES DAILY.  Marland Kitchen GLUCOSAMINE-CHONDROITIN-VIT D3 PO Take 1 tablet by mouth daily.    Marland Kitchen L-Lysine 500 MG TABS Take 1 tablet by mouth daily.    Marland Kitchen lidocaine (XYLOCAINE) 2 % jelly APPLY TOPICALLY AS NEEDED. APPLY TWICE DAILY AS NEEDED TO SKIN LESION ON TEMPLE  . metoprolol (LOPRESSOR) 50 MG tablet Take 1 tablet (50 mg total) by mouth 2 (two) times daily.  . potassium chloride SA (KLOR-CON M20) 20 MEQ tablet Take 1 tablet (20 mEq total) by mouth daily.  . Selenium 200 MCG TABS Take 1 tablet by mouth daily.    . traZODone (DESYREL) 50 MG tablet Take 1.5 tablets (75 mg total) by mouth at bedtime.    Review of Systems  Constitutional: Negative.   HENT: Negative.   Eyes: Negative.   Respiratory: Negative.   Cardiovascular: Negative.  Gastrointestinal: Negative.   Endocrine: Negative.   Musculoskeletal: Positive for arthralgias and gait problem.  Skin: Negative.   Allergic/Immunologic: Negative.   Neurological: Negative.   Hematological: Negative.   Psychiatric/Behavioral: Negative.   All other systems reviewed and are negative.  BP 120/60  Pulse 66  Ht 5\' 3"  (1.6 m)  Wt 129 lb 8 oz (58.741 kg)  BMI 22.95 kg/m2  Physical Exam  Nursing note and vitals reviewed. Constitutional: She is oriented to person, place, and time. She appears well-developed and  well-nourished.  HENT:  Head: Normocephalic.  Nose: Nose normal.  Mouth/Throat: Oropharynx is clear and moist.  Eyes: Conjunctivae are normal. Pupils are equal, round, and reactive to light.  Neck: Normal range of motion. Neck supple. No JVD present.  Cardiovascular: Normal rate, S1 normal, S2 normal, normal heart sounds and intact distal pulses.  An irregularly irregular rhythm present. Exam reveals no gallop and no friction rub.   No murmur heard. Trace ankle edema  Pulmonary/Chest: Effort normal and breath sounds normal. No respiratory distress. She has no wheezes. She has no rales. She exhibits no tenderness.  Abdominal: Soft. Bowel sounds are normal. She exhibits no distension. There is no tenderness.  Musculoskeletal: Normal range of motion. She exhibits no edema and no tenderness.  Lymphadenopathy:    She has no cervical adenopathy.  Neurological: She is alert and oriented to person, place, and time. Coordination normal.  Skin: Skin is warm and dry. No rash noted. No erythema.  Psychiatric: She has a normal mood and affect. Her behavior is normal. Judgment and thought content normal.    Assessment and Plan

## 2014-02-08 NOTE — Assessment & Plan Note (Signed)
Heart rate well controlled, tolerating anticoagulation 

## 2014-02-08 NOTE — Patient Instructions (Signed)
You are doing well. No medication changes were made.  Keep exercising  Please call us if you have new issues that need to be addressed before your next appt.  Your physician wants you to follow-up in: 6 months.  You will receive a reminder letter in the mail two months in advance. If you don't receive a letter, please call our office to schedule the follow-up appointment.

## 2014-02-08 NOTE — Assessment & Plan Note (Signed)
Active, exercising almost daily with no symptoms of shortness of breath or chest pain

## 2014-02-08 NOTE — Assessment & Plan Note (Signed)
No significant lower extremity edema. She's taking Lasix twice a day. She does have significant by mouth fluid intake. Overall appears euvolemic

## 2014-02-08 NOTE — Assessment & Plan Note (Signed)
Blood pressure is well controlled on today's visit. No changes made to the medications. 

## 2014-02-09 ENCOUNTER — Encounter: Payer: Self-pay | Admitting: Internal Medicine

## 2014-02-09 ENCOUNTER — Ambulatory Visit (INDEPENDENT_AMBULATORY_CARE_PROVIDER_SITE_OTHER): Payer: Medicare Other | Admitting: Internal Medicine

## 2014-02-09 VITALS — BP 117/86 | HR 73 | Temp 97.9°F | Resp 14 | Ht 63.0 in | Wt 130.0 lb

## 2014-02-09 DIAGNOSIS — I10 Essential (primary) hypertension: Secondary | ICD-10-CM

## 2014-02-09 DIAGNOSIS — F0390 Unspecified dementia without behavioral disturbance: Secondary | ICD-10-CM

## 2014-02-09 DIAGNOSIS — Z0000 Encounter for general adult medical examination without abnormal findings: Secondary | ICD-10-CM

## 2014-02-09 DIAGNOSIS — I482 Chronic atrial fibrillation, unspecified: Secondary | ICD-10-CM

## 2014-02-09 DIAGNOSIS — M25559 Pain in unspecified hip: Secondary | ICD-10-CM

## 2014-02-09 NOTE — Patient Instructions (Addendum)
Your thyroid,  cholesterol, liver and kidney function are normal.  You do not need any medication changes.   Please plan to repeat the labs in 6 months.   Try taking your trazodone right after dinner so it goes to work earlier and does not  leave you groggy   Suspend your blood thinner on Friday (none on Saturday Sunday or Monday )  So Dr Evorn Gong can treat your skin lesion without it bleeding too much   Your legs are discolored due to venous insufficiency.  You do not need treatment except wearing compression stockings.   Venous Stasis or Chronic Venous Insufficiency Chronic venous insufficiency, also called venous stasis, is a condition that affects the veins in the legs. The condition prevents blood from being pumped through these veins effectively. Blood may no longer be pumped effectively from the legs back to the heart. This condition can range from mild to severe. With proper treatment, you should be able to continue with an active life. CAUSES  Chronic venous insufficiency occurs when the vein walls become stretched, weakened, or damaged or when valves within the vein are damaged. Some common causes of this include:  High blood pressure inside the veins (venous hypertension).  Increased blood pressure in the leg veins from long periods of sitting or standing.  A blood clot that blocks blood flow in a vein (deep vein thrombosis).  Inflammation of a superficial vein (phlebitis) that causes a blood clot to form. RISK FACTORS Various things can make you more likely to develop chronic venous insufficiency, including:  Family history of this condition.  Obesity.  Pregnancy.  Sedentary lifestyle.  Smoking.  Jobs requiring long periods of standing or sitting in one place.  Being a certain age. Women in their 62s and 7s and men in their 53s are more likely to develop this condition. SIGNS AND SYMPTOMS  Symptoms may include:   Varicose veins.  Skin breakdown or  ulcers.  Reddened or discolored skin on the leg.  Brown, smooth, tight, and painful skin just above the ankle, usually on the inside surface (lipodermatosclerosis).  Swelling. DIAGNOSIS  To diagnose this condition, your health care provider will take a medical history and do a physical exam. The following tests may be ordered to confirm the diagnosis:  Duplex ultrasound--A procedure that produces a picture of a blood vessel and nearby organs and also provides information on blood flow through the blood vessel.  Plethysmography--A procedure that tests blood flow.  A venogram, or venography--A procedure used to look at the veins using X-ray and dye. TREATMENT The goals of treatment are to help you return to an active life and to minimize pain or disability. Treatment will depend on the severity of the condition. Medical procedures may be needed for severe cases. Treatment options may include:   Use of compression stockings. These can help with symptoms and lower the chances of the problem getting worse, but they do not cure the problem.  Sclerotherapy--A procedure involving an injection of a material that "dissolves" the damaged veins. Other veins in the network of blood vessels take over the function of the damaged veins.  Surgery to remove the vein or cut off blood flow through the vein (vein stripping or laser ablation surgery).  Surgery to repair a valve. HOME CARE INSTRUCTIONS   Wear compression stockings as directed by your health care provider.  Only take over-the-counter or prescription medicines for pain, discomfort, or fever as directed by your health care provider.  Follow  up with your health care provider as directed. SEEK MEDICAL CARE IF:   You have redness, swelling, or increasing pain in the affected area.  You see a red streak or line that extends up or down from the affected area.  You have a breakdown or loss of skin in the affected area, even if the breakdown is  small.  You have an injury to the affected area. SEEK IMMEDIATE MEDICAL CARE IF:   You have an injury and open wound in the affected area.  Your pain is severe and does not improve with medicine.  You have sudden numbness or weakness in the foot or ankle below the affected area, or you have trouble moving your foot or ankle.  You have a fever or persistent symptoms for more than 2-3 days.  You have a fever and your symptoms suddenly get worse. MAKE SURE YOU:   Understand these instructions.  Will watch your condition.  Will get help right away if you are not doing well or get worse. Document Released: 10/15/2006 Document Revised: 04/01/2013 Document Reviewed: 02/16/2013 [facility] Patient Information 2015 [place], [place]. This information is not intended to replace advice given to you by your health care provider. Make sure you discuss any questions you have with your health care provider.

## 2014-02-09 NOTE — Progress Notes (Signed)
Pre-visit discussion using our clinic review tool. No additional management support is needed unless otherwise documented below in the visit note.  

## 2014-02-09 NOTE — Progress Notes (Signed)
Patient ID: Donna Rosario, female   DOB: 08/24/1922, 80 y.o.   MRN: [id number]  The patient is here for  management of  chronic and acute problems.  She has  chronic hip pain that does not prevent her from exercising 5 weeks daily.  pain is worse after inactivity and resolves with activity   Sees the dermatologist on Monday  To see Dr Evorn Gong for the right temple lesion .   Still IADL,  No falls,  Attends [employer] on Sundays  insomnia managed with trazodone  that makes her groggy.  Suggested taking it earlier  In the evening.   Eye exam once a year,  Is due        The risk factors are reflected in the social history.  The roster of all physicians providing medical care to patient - is listed in the Snapshot section of the chart.  Activities of daily living:  The patient is 100% independent in all ADLs: dressing, toileting, feeding as well as independent mobility  Home safety : The patient has smoke detectors in the home. They wear seatbelts.  There are no firearms at home. There is no violence in the home.   There is no risks for hepatitis, STDs or HIV. There is no   history of blood transfusion. They have no travel history to infectious disease endemic areas of the world.  The patient has seen their dentist in the last six month. They have not seen their eye doctor in the last year. They admit to slight hearing difficulty with regard to whispered voices and some television programs.  They have deferred audiologic testing in the last year.  They do not  have excessive sun exposure. Discussed the need for sun protection: hats, long sleeves and use of sunscreen if there is significant sun exposure.   Diet: the importance of a healthy diet is discussed. They do have a healthy diet.  The benefits of regular aerobic exercise were discussed. She walks 4 times per week ,  20 minutes.   Depression screen: there are no signs or vegative symptoms of depression- irritability, change in  appetite, anhedonia, sadness/tearfullness.  Cognitive assessment: the patient manages all their financial and personal affairs and is actively engaged. They could relate day,date,year and events; recalled 2/3 objects at 3 minutes; performed clock-face test normally.  The following portions of the patient's history were reviewed and updated as appropriate: allergies, current medications, past family history, past medical history,  past surgical history, past social history  and problem list.  Visual acuity was not assessed per patient preference since she has regular follow up with her ophthalmologist. Hearing and body mass index were assessed and reviewed.   During the course of the visit the patient was educated and counseled about appropriate screening and preventive services including : fall prevention , diabetes screening, nutrition counseling, colorectal cancer screening, and recommended immunizations.    Objective:  BP 117/86  Pulse 73  Temp(Src) 97.9 F (36.6 C) (Oral)  Resp 14  Ht 5\' 3"  (1.6 m)  Wt 130 lb (58.968 kg)  BMI 23.03 kg/m2  SpO2 97%  General appearance: alert, cooperative and appears stated age Ears: normal TM's and external ear canals both ears Throat: lips, mucosa, and tongue normal; teeth and gums normal Neck: no adenopathy, no carotid bruit, supple, symmetrical, trachea midline and thyroid not enlarged, symmetric, no tenderness/mass/nodules Back: symmetric, no curvature. ROM normal. No CVA tenderness. Lungs: clear to auscultation bilaterally Heart: regular rate and rhythm,  S1, S2 normal, no murmur, click, rub or gallop Abdomen: soft, non-tender; bowel sounds normal; no masses,  no organomegaly Pulses: 2+ and symmetric Skin: Skin color, texture, turgor normal. No rashes or lesions Lymph nodes: Cervical, supraclavicular, and axillary nodes normal.  Assessment and Plan:  Dementia present for over two years. Patient continues toodecline HH aide in the past and  thus far is taking her medications correctly.     HYPERTENSION, BENIGN Well controlled on current regimen. Renal function stable, no changes today.  Lab Results  Component Value Date   CREATININE 1.0 01/12/2014   Lab Results  Component Value Date   NA 141 01/12/2014   K 4.2 01/12/2014   CL 107 01/12/2014   CO2 25 01/12/2014     ATRIAL FIBRILLATION Well controlled on current medications.  No changes today.        Updated Medication List Outpatient Encounter Prescriptions as of 02/09/2014  Medication Sig  . alendronate (FOSAMAX) 70 MG tablet TAKE 1 TABLET ONCE WEEKLY--TAKE WITH FULL GLASS OF WATER ON EMPTY STOMACH  . ARMOUR THYROID 60 MG tablet TAKE 1 TABLET (60 MG TOTAL) BY MOUTH DAILY.  Marland Kitchen Ascorbic Acid (VITAMIN C) 500 MG tablet Take 500 mg by mouth daily.    . Calcium Carbonate-Vit D-Min (CALCIUM 1200 PO) Take 1 capsule by mouth daily.    . clobetasol ointment (TEMOVATE) 5.46 % Apply 1 application topically 2 (two) times daily. To vaginal area nightly for 4 weeks  . dabigatran (PRADAXA) 150 MG CAPS capsule Take 1 capsule (150 mg total) by mouth 2 (two) times daily.  . digoxin (LANOXIN) 0.125 MG tablet TAKE 1 TABLET BY MOUTH EVERY DAY  . felodipine (PLENDIL) 2.5 MG 24 hr tablet Take 1 tablet (2.5 mg total) by mouth daily.  . fish oil-omega-3 fatty acids 1000 MG capsule Take 1 g by mouth daily.    . furosemide (LASIX) 40 MG tablet TAKE 1 TABLET BY MOUTH 2 TIMES DAILY.  Marland Kitchen GLUCOSAMINE-CHONDROITIN-VIT D3 PO Take 1 tablet by mouth daily.    Marland Kitchen L-Lysine 500 MG TABS Take 1 tablet by mouth daily.    Marland Kitchen lidocaine (XYLOCAINE) 2 % jelly APPLY TOPICALLY AS NEEDED. APPLY TWICE DAILY AS NEEDED TO SKIN LESION ON TEMPLE  . metoprolol (LOPRESSOR) 50 MG tablet Take 1 tablet (50 mg total) by mouth 2 (two) times daily.  . potassium chloride SA (KLOR-CON M20) 20 MEQ tablet Take 1 tablet (20 mEq total) by mouth daily.  . Selenium 200 MCG TABS Take 1 tablet by mouth daily.    . traZODone (DESYREL) 50  MG tablet Take 1.5 tablets (75 mg total) by mouth at bedtime.

## 2014-02-10 DIAGNOSIS — Z0000 Encounter for general adult medical examination without abnormal findings: Secondary | ICD-10-CM | POA: Insufficient documentation

## 2014-02-10 NOTE — Assessment & Plan Note (Signed)
Well controlled on current regimen. Renal function stable, no changes today.  Lab Results  Component Value Date   CREATININE 1.0 01/12/2014   Lab Results  Component Value Date   NA 141 01/12/2014   K 4.2 01/12/2014   CL 107 01/12/2014   CO2 25 01/12/2014

## 2014-02-10 NOTE — Assessment & Plan Note (Signed)
Well controlled on current medications.  No changes today. 

## 2014-02-10 NOTE — Assessment & Plan Note (Signed)
present for over two years. Patient continues toodecline HH aide in the past and thus far is taking her medications correctly.

## 2014-03-06 ENCOUNTER — Other Ambulatory Visit: Payer: Self-pay | Admitting: Internal Medicine

## 2014-03-09 ENCOUNTER — Other Ambulatory Visit: Payer: Self-pay | Admitting: Internal Medicine

## 2014-06-04 ENCOUNTER — Telehealth: Payer: Self-pay

## 2014-06-04 ENCOUNTER — Emergency Department: Payer: Self-pay | Admitting: Emergency Medicine

## 2014-06-04 NOTE — Telephone Encounter (Signed)
Donna Rosario is calling asking that a written rx for Norco be sent over  Fax - [phone]

## 2014-06-04 NOTE — Telephone Encounter (Signed)
Patient fell on 06/03/14 and is c/o pain going right leg from hip , patient will not attempt to put weight on leg. On call MD was called last night gave verbal for norco 5/325 BID prn evry 12 hours, facility called for hard script. Ask if patient had been evaluated for any fractures or had any x-ray no ask if patient had refused to go to er stated no just assumed she came ambulatory care she did not want to go to ER. Advised patient needs to go to ER if non weight baring and pain at 7 out of 10 on pain scale. Please advise.

## 2014-06-04 NOTE — Telephone Encounter (Signed)
Agrree,  Needs to go to ER

## 2014-06-04 NOTE — Telephone Encounter (Signed)
Patient at ER  

## 2014-06-08 ENCOUNTER — Emergency Department: Payer: Self-pay | Admitting: Emergency Medicine

## 2014-06-08 ENCOUNTER — Telehealth: Payer: Self-pay

## 2014-06-08 DIAGNOSIS — M25551 Pain in right hip: Secondary | ICD-10-CM

## 2014-06-08 LAB — COMPREHENSIVE METABOLIC PANEL
ALBUMIN: 3 g/dL — AB (ref 3.4–5.0)
ALK PHOS: 62 U/L
Anion Gap: 6 — ABNORMAL LOW (ref 7–16)
BUN: 22 mg/dL — ABNORMAL HIGH (ref 7–18)
Bilirubin,Total: 0.6 mg/dL (ref 0.2–1.0)
CALCIUM: 9 mg/dL (ref 8.5–10.1)
CREATININE: 0.95 mg/dL (ref 0.60–1.30)
Chloride: 110 mmol/L — ABNORMAL HIGH (ref 98–107)
Co2: 26 mmol/L (ref 21–32)
EGFR (African American): >60
EGFR (Non-African Amer.): 59 — ABNORMAL LOW
Glucose: 105 mg/dL — ABNORMAL HIGH (ref 65–99)
Osmolality: 287 (ref 275–301)
POTASSIUM: 3.9 mmol/L (ref 3.5–5.1)
SGOT(AST): 25 U/L (ref 15–37)
SGPT (ALT): 21 U/L
Sodium: 142 mmol/L (ref 136–145)
Total Protein: 6.5 g/dL (ref 6.4–8.2)

## 2014-06-08 LAB — CBC
HCT: 43.1 % (ref 35.0–47.0)
HGB: 14 g/dL (ref 12.0–16.0)
MCH: 29.2 pg (ref 26.0–34.0)
MCHC: 32.6 g/dL (ref 32.0–36.0)
MCV: 90 fL (ref 80–100)
PLATELETS: 235 10*3/uL (ref 150–440)
RBC: 4.81 10*6/uL (ref 3.80–5.20)
RDW: 14.4 % (ref 11.5–14.5)
WBC: 10.8 10*3/uL (ref 3.6–11.0)

## 2014-06-08 LAB — TROPONIN I

## 2014-06-08 MED ORDER — HYDROCODONE-ACETAMINOPHEN 5-325 MG PO TABS: 1.0000 | ORAL_TABLET | Freq: Four times a day (QID) | ORAL | Status: DC | PRN

## 2014-06-08 NOTE — Telephone Encounter (Signed)
I reviewed the ED notes.  She had plain x rays and a Ct of the hip to make sure her artificial hip had not become loose. Everything looked  Fine does not need to see me,  But I will prescribe vicodin for pain and PT to increase ambulation

## 2014-06-08 NOTE — Telephone Encounter (Signed)
Nurses office closes at 4 pm will have to return call on 06/09/14

## 2014-06-08 NOTE — Telephone Encounter (Signed)
Nursing facility cannot tell me what was done at ED I have sent for notes patient is taking tylenol for pain and is c/o pain 6 on scale 0- to 10

## 2014-06-08 NOTE — Telephone Encounter (Signed)
The skilled nursing facility called and stated the patient is still in pain after her fall.  She is hoping she can be worked in.  Do you want her worked in, if so where?   Skilled nursing facility - callback - [phone]

## 2014-06-08 NOTE — Addendum Note (Signed)
Addended by: Nanci Pina on: 06/08/2014 04:11 PM   Modules accepted: Orders

## 2014-06-09 NOTE — Telephone Encounter (Signed)
Spoke with pt, advised Rx ready for pick up 

## 2014-06-10 ENCOUNTER — Telehealth: Payer: Self-pay

## 2014-06-10 ENCOUNTER — Encounter: Payer: Self-pay | Admitting: Internal Medicine

## 2014-06-10 ENCOUNTER — Ambulatory Visit (INDEPENDENT_AMBULATORY_CARE_PROVIDER_SITE_OTHER): Payer: Medicare Other | Admitting: Internal Medicine

## 2014-06-10 VITALS — BP 120/80 | HR 89 | Temp 98.3°F | Wt 126.0 lb

## 2014-06-10 DIAGNOSIS — M25551 Pain in right hip: Secondary | ICD-10-CM

## 2014-06-10 DIAGNOSIS — Z9181 History of falling: Secondary | ICD-10-CM

## 2014-06-10 DIAGNOSIS — F0390 Unspecified dementia without behavioral disturbance: Secondary | ICD-10-CM

## 2014-06-10 MED ORDER — DIGOXIN 125 MCG PO TABS: 125.0000 ug | ORAL_TABLET | Freq: Every day | ORAL | Status: DC

## 2014-06-10 NOTE — Assessment & Plan Note (Signed)
Vascular.  She continues to live independently at [facility] since her husband's passing in 2012 .  Discussed with son [name] who was unaware of her diagnosis. Medication supervision has been advised in the past but patient has habitually refused.

## 2014-06-10 NOTE — Telephone Encounter (Signed)
The patient's son called and is hoping to speak directly to you about the patient and the situation with her leg.   Son's name - Teresia Myint - [phone]   He is hoping to speak with you after the appointment to get an update on the patient's condition.    (he stated the communication between the patient and neighbor (the person that brings her) is not going well) and he is hoping to get the direct scoop on the patient's condition.  He has power of attorney.

## 2014-06-10 NOTE — Progress Notes (Signed)
Pre visit review using our clinic review tool, if applicable. No additional management support is needed unless otherwise documented below in the visit note. 

## 2014-06-10 NOTE — Patient Instructions (Signed)
I am ordering home health to come out to your home and help you get your strength back  Continue to use the oxycodone for severe pain.  You should take Ex Lax or Dulcolax at night while you are on this medication to avoid getting constipated  You can use the hydrocodone for pain control when you pain is not as severe  I have refilled your digoxin

## 2014-06-10 NOTE — Assessment & Plan Note (Addendum)
Secondary to contusions and ligament sprains, presumed , given lack of objective evidence of fracture, hematoma, dislocation  and loosening pf prosthesis hardware by x ray and CT done on Dec 11 at [facility].  DVT has also been ruled out by 2nd ER evaluation.  Continue percocet,  downgrade to hydrodocone when pain severity has diminished,  And PT home health order palced.

## 2014-06-10 NOTE — Progress Notes (Signed)
Patient ID: Donna Rosario, female   DOB: 10/11/1922, 80 y.o.   MRN: [id number]  Patient Active Problem List   Diagnosis Date Noted  . Acute right hip pain 06/10/2014  . Basal cell carcinoma of temple region 12/15/2013  . Chronic diastolic CHF (congestive heart failure) 08/10/2013  . POD (perioral dermatitis) 07/16/2013  . Chest pain 02/11/2013  . Vaginitis and vulvovaginitis 01/30/2013  . Pityriasis rosea-like drug eruption 09/26/2012  . Allergic urticaria 09/23/2012  . Dizziness 04/09/2012  . Insomnia 10/31/2011  . Venous insufficiency of leg 03/11/2011  . Dementia 03/09/2011  . DIASTOLIC DYSFUNCTION 29/52/8413  . HYPERTENSION, BENIGN 03/30/2010  . ATRIAL FIBRILLATION 03/30/2010  . Edema 03/30/2010    Subjective:  CC:   Chief Complaint  Patient presents with  . ED follow-up    HPI:   Donna Rosario is a 80 y.o. female with history of vascular dementia,  chronic atrial fibrillation,  On Pradaxa, DJD s/p right hip arthroplasty and left knee replacement presents with persistent pain in right hip and thigh since Dec 11.  Pain started several hours after a fall at home on Dec 10 that was unwitnessed.   Apparently she tripped over an area /scatter rug and fell.  Both legs were banged up, as is evidenced by the resolving bruise on left knee.  Right hip started hurting later on the day, right after she sat behind the wheel of new car and leaned out to  close the drivers side door .  The pain became progressively worse throughout the evening.  She became unable to walk and was admitted to Respite care at [facility].  A request for vicodin was received by office from the RN at [facility] and I advised them to take patient to ED for x rays.  Plain films of right hip and left knee were negative for femur fracture and CT of right hip was also done to rule out loosening of right hip prosthesis and rule out hematoma formation. Patient was sent home and spent 3 days in Extended Care at [facility].  Due  to persistent severe pain she returned to ER yesterday.  Additional imaging was limited to an ultrasound of the right lower extremity which was negative for DVT. She was sent home with an rx for #`10 percocet.   She is accompanied by her neighbor Bernie Covey who came to the office yesterday to pick up Ms Santa's prescription for vicodin, and reported yesterday that she was walking much better and her pain was controlled with Percocet.  Today the patient states that her pain is terrible again and limited to right inner hip and thigh. She denies lateral hip pain and low back pain.  It is unclear whether she took the Percocet today.  She is now back in her home and using her rolling walker with seat to ambulate.  Her son [name] has asked me to call him,.  He has POA , which was not documented in chart.  [name] has given me verbal and written permission to speak with him.    Past Medical History  Diagnosis Date  . Chronic low back pain     due to lumbosacral degenerative joint disease  . Hypertension   . Arthritis   . Osteoporosis   . Atrial fibrillation Sept. 2011    [facility] Admission for RVR  . Thyroid disease     Hypothyroidism  . Disturbance of skin sensation   . Hyperlipidemia   . Mitral valve disorders   .  Sciatica   . Vascular dementia, uncomplicated     Past Surgical History  Procedure Laterality Date  . Knee surgery      Left knee  . Thyroidectomy    . Total hip arthroplasty  2011  . Joint replacement      Right Hip Oct 2011,  Left Knee 2008 ([facility])       The following portions of the patient's history were reviewed and updated as appropriate: Allergies, current medications, and problem list.    Review of Systems:   Patient denies headache, fevers, malaise, unintentional weight loss, skin rash, eye pain, sinus congestion and sinus pain, sore throat, dysphagia,  hemoptysis , cough, dyspnea, wheezing, chest pain, palpitations, orthopnea, edema, abdominal pain, nausea,  melena, diarrhea, constipation, flank pain, dysuria, hematuria, urinary  Frequency, nocturia, numbness, tingling, seizures,  Focal weakness, Loss of consciousness,  Tremor, insomnia, depression, anxiety, and suicidal ideation.     History   Social History  . Marital Status: Married    Spouse Name: N/A    Number of Children: N/A  . Years of Education: N/A   Occupational History  . Not on file.   Social History Main Topics  . Smoking status: Never Smoker   . Smokeless tobacco: Never Used  . Alcohol Use: 3.0 oz/week    5 Glasses of wine per week     Comment: occasional  . Drug Use: No  . Sexual Activity: No   Other Topics Concern  . Not on file   Social History Narrative    Objective:  Filed Vitals:   06/10/14 1216  BP: 120/80  Pulse: 89  Temp: 98.3 F (36.8 C)     General appearance: alert, cooperative and appears stated age Ears: normal TM's and external ear canals both ears  Back: symmetric, no curvature. ROM normal. No CVA tenderness. No vertebral tenderness  MSK: No pain with abduction .  Lungs: clear to auscultation bilaterally Heart: regular rate and rhythm, S1, S2 normal, no murmur, click, rub or gallop Abdomen: soft, non-tender; bowel sounds normal; no masses,  no organomegaly Pulses: 2+ and symmetric Skin: Skin color, texture, turgor normal. No rashes or lesions Lymph nodes: Cervical, supraclavicular, and axillary nodes normal. Gait: antalgic, using walker   Assessment and Plan:  Acute right hip pain Secondary to contusions and ligament sprains, presumed , given lack of objective evidence of fracture, hematoma, dislocation  and loosening pf prosthesis hardware by x ray and CT done on Dec 11 at [facility].  DVT has also been ruled out by 2nd ER evaluation.  Continue percocet downgrade to hydrodocone when pain severity has diminished,  And PT home health order palcwed.   Dementia Vascular.  She continues to live independently at [facility] since her husband's  passing in 2012 .  Discussed with son [name] who was unaware of her diagnosis. Medication supervision has been advised in the past but patient has habitually refused.   A total of 40 minutes was spent with patient more than half of which was spent in counseling patient on the above mentioned issues , reviewing and explaining recent labs and imaging studies done, and coordination of care.   Updated Medication List Outpatient Encounter Prescriptions as of 06/10/2014  Medication Sig  . alendronate (FOSAMAX) 70 MG tablet TAKE 1 TABLET ONCE WEEKLY--TAKE WITH FULL GLASS OF WATER ON EMPTY STOMACH  . ARMOUR THYROID 60 MG tablet TAKE 1 TABLET (60 MG TOTAL) BY MOUTH DAILY.  Marland Kitchen Ascorbic Acid (VITAMIN C) 500 MG tablet  Take 500 mg by mouth daily.    . Calcium Carbonate-Vit D-Min (CALCIUM 1200 PO) Take 1 capsule by mouth daily.    . dabigatran (PRADAXA) 150 MG CAPS capsule Take 1 capsule (150 mg total) by mouth 2 (two) times daily.  . digoxin (LANOXIN) 0.125 MG tablet Take 1 tablet (125 mcg total) by mouth daily.  . felodipine (PLENDIL) 2.5 MG 24 hr tablet Take 1 tablet (2.5 mg total) by mouth daily.  . fish oil-omega-3 fatty acids 1000 MG capsule Take 1 g by mouth daily.    . furosemide (LASIX) 40 MG tablet TAKE 1 TABLET BY MOUTH 2 TIMES DAILY.  Marland Kitchen GLUCOSAMINE-CHONDROITIN-VIT D3 PO Take 1 tablet by mouth daily.    Marland Kitchen HYDROcodone-acetaminophen (NORCO/VICODIN) 5-325 MG per tablet Take 1 tablet by mouth every 6 (six) hours as needed for moderate pain.  Marland Kitchen L-Lysine 500 MG TABS Take 1 tablet by mouth daily.    Marland Kitchen lidocaine (XYLOCAINE) 2 % jelly APPLY TOPICALLY AS NEEDED. APPLY TWICE DAILY AS NEEDED TO SKIN LESION ON TEMPLE  . metoprolol (LOPRESSOR) 50 MG tablet Take 1 tablet (50 mg total) by mouth 2 (two) times daily.  Marland Kitchen oxyCODONE (OXY IR/ROXICODONE) 5 MG immediate release tablet   . potassium chloride SA (KLOR-CON M20) 20 MEQ tablet Take 1 tablet (20 mEq total) by mouth daily.  . Selenium 200 MCG TABS Take 1 tablet  by mouth daily.    . traZODone (DESYREL) 50 MG tablet Take 1.5 tablets (75 mg total) by mouth at bedtime.  . [DISCONTINUED] digoxin (LANOXIN) 0.125 MG tablet TAKE 1 TABLET BY MOUTH EVERY DAY  . [DISCONTINUED] ARMOUR THYROID 60 MG tablet TAKE 1 TABLET (60 MG TOTAL) BY MOUTH DAILY.  . [DISCONTINUED] clobetasol ointment (TEMOVATE) 0.63 % Apply 1 application topically 2 (two) times daily. To vaginal area nightly for 4 weeks     Orders Placed This Encounter  Procedures  . Ambulatory referral to Home Health    No Follow-up on file.

## 2014-06-12 NOTE — Progress Notes (Addendum)
Patient ID: Donna Rosario, female   DOB: 02/11/1923, 80 y.o.   MRN: [id number]   Patient Active Problem List   Diagnosis Date Noted  . Acute right hip pain 06/10/2014  . Basal cell carcinoma of temple region 12/15/2013  . Chronic diastolic CHF (congestive heart failure) 08/10/2013  . POD (perioral dermatitis) 07/16/2013  . Chest pain 02/11/2013  . Vaginitis and vulvovaginitis 01/30/2013  . Pityriasis rosea-like drug eruption 09/26/2012  . Allergic urticaria 09/23/2012  . Dizziness 04/09/2012  . Insomnia 10/31/2011  . Venous insufficiency of leg 03/11/2011  . Dementia 03/09/2011  . DIASTOLIC DYSFUNCTION 08/65/7846  . HYPERTENSION, BENIGN 03/30/2010  . ATRIAL FIBRILLATION 03/30/2010  . Edema 03/30/2010    Subjective:  CC:   Chief Complaint  Patient presents with  . ED follow-up    HPI:   Donna Rosario is a 79 y.o. female who presents for   History of vascular dementia,  chronic atrial fibrillation,  On Pradaxa, DJD s/p right hip arthroplasty and left knee replacement presents with persistent pain in right hip and thigh since Dec 11.  Pain started several hours after a fall at home on Dec 10 that was unwitnessed.   Apparently she tripped over an area /scatter rug and fell.  Both legs were banged up, as is evidenced by the resolving bruise on left knee.  Right hip started hurting later on the day, right after she sat behind the wheel of new car and leaned out to  close the drivers side door .  The pain became progressively worse throughout the evening.  She became unable to walk and was admitted to Respite care at [facility].  A request for vicodin was received by office from the RN at [facility] and I advised them to take patient to ED for x rays.  Plain films of right hip and left knee were negative for femur fracture and CT of right hip was also done to rule out loosening of right hip prosthesis and rule out hematoma formation. Patient was sent home and spent 3 days in Extended Care at  [facility].  Due to persistent severe pain she returned to ER yesterday.  Additional imaging was limited to an ultrasound of the right lower extremity which was negative for DVT. She was sent home with an rx for #`10 percocet.   She is accompanied by her neighbor Donna Rosario who came to the office yesterday to pick up [name]'s prescription for vicodin, and reported yesterday that she was walking much better and her pain was controlled with Percocet.  Today the patient states that her pain is terrible again ,  It is unclear whether she took the Percocet today.  She is now back in her home and using her rolling walker with seat to ambulate.  Her son Donna Rosario has asked me to call him,.  He has POA , which was not documented in chart.  [name] has given me verbal and written permission to speak with him.     Past Medical History  Diagnosis Date  . Chronic low back pain     due to lumbosacral degenerative joint disease  . Hypertension   . Arthritis   . Osteoporosis   . Atrial fibrillation Sept. 2011    [facility] Admission for RVR  . Thyroid disease     Hypothyroidism  . Disturbance of skin sensation   . Hyperlipidemia   . Mitral valve disorders   . Sciatica   . Vascular dementia, uncomplicated  Past Surgical History  Procedure Laterality Date  . Knee surgery      Left knee  . Thyroidectomy    . Total hip arthroplasty  2011  . Joint replacement      Right Hip Oct 2011,  Left Knee 2008 ([facility])       The following portions of the patient's history were reviewed and updated as appropriate: Allergies, current medications, and problem list.    Review of Systems:   Patient denies headache, fevers, malaise, unintentional weight loss, skin rash, eye pain, sinus congestion and sinus pain, sore throat, dysphagia,  hemoptysis , cough, dyspnea, wheezing, chest pain, palpitations, orthopnea, edema, abdominal pain, nausea, melena, diarrhea, constipation, flank pain, dysuria, hematuria, urinary   Frequency, nocturia, numbness, tingling, seizures,  Focal weakness, Loss of consciousness,  Tremor, insomnia, depression, anxiety, and suicidal ideation.     History   Social History  . Marital Status: Married    Spouse Name: N/A    Number of Children: N/A  . Years of Education: N/A   Occupational History  . Not on file.   Social History Main Topics  . Smoking status: Never Smoker   . Smokeless tobacco: Never Used  . Alcohol Use: 3.0 oz/week    5 Glasses of wine per week     Comment: occasional  . Drug Use: No  . Sexual Activity: No   Other Topics Concern  . Not on file   Social History Narrative    Objective:  Filed Vitals:   06/10/14 1216  BP: 120/80  Pulse: 89  Temp: 98.3 F (36.8 C)     General appearance: alert, cooperative and appears stated age Ears: normal TM's and external ear canals both ears Throat: lips, mucosa, and tongue normal; teeth and gums normal Neck: no adenopathy, no carotid bruit, supple, symmetrical, trachea midline and thyroid not enlarged, symmetric, no tenderness/mass/nodules Back: symmetric, no curvature. ROM normal. No CVA tenderness. Lungs: clear to auscultation bilaterally Heart: regular rate and rhythm, S1, S2 normal, no murmur, click, rub or gallop Abdomen: soft, non-tender; bowel sounds normal; no masses,  no organomegaly Pulses: 2+ and symmetric Skin: Skin color, texture, turgor normal. No rashes or lesions Lymph nodes: Cervical, supraclavicular, and axillary nodes normal.  Assessment and Plan:  Acute right hip pain Secondary to contusions and ligament sprains, presumed , given lack of objective evidence of fracture, hematoma, dislocation  and loosening pf prosthesis hardware by x ray and CT done on Dec 11 at [facility]  DVT has also been ruled out by 2nd ER evaluation.  Continue percocet,  downgrade to hydrodocone when pain severity has diminished,  And PT home health order palced.   Dementia Vascular.  She continues to live  independently at [facility] since her husband's passing in 2012 .  Discussed with son Donna Rosario who was unaware of her diagnosis. Medication supervision has been advised in the past but patient has habitually refused.   A total of 40 minutes was spent with patient more than half of which was spent in counseling patient on the above mentioned issues , reviewing and explaining recent labs and imaging studies done, and coordination of care.  Updated Medication List Outpatient Encounter Prescriptions as of 06/10/2014  Medication Sig  . alendronate (FOSAMAX) 70 MG tablet TAKE 1 TABLET ONCE WEEKLY--TAKE WITH FULL GLASS OF WATER ON EMPTY STOMACH  . ARMOUR THYROID 60 MG tablet TAKE 1 TABLET (60 MG TOTAL) BY MOUTH DAILY.  Marland Kitchen Ascorbic Acid (VITAMIN C) 500 MG tablet Take  500 mg by mouth daily.    . Calcium Carbonate-Vit D-Min (CALCIUM 1200 PO) Take 1 capsule by mouth daily.    . dabigatran (PRADAXA) 150 MG CAPS capsule Take 1 capsule (150 mg total) by mouth 2 (two) times daily.  . digoxin (LANOXIN) 0.125 MG tablet Take 1 tablet (125 mcg total) by mouth daily.  . felodipine (PLENDIL) 2.5 MG 24 hr tablet Take 1 tablet (2.5 mg total) by mouth daily.  . fish oil-omega-3 fatty acids 1000 MG capsule Take 1 g by mouth daily.    . furosemide (LASIX) 40 MG tablet TAKE 1 TABLET BY MOUTH 2 TIMES DAILY.  Marland Kitchen GLUCOSAMINE-CHONDROITIN-VIT D3 PO Take 1 tablet by mouth daily.    Marland Kitchen HYDROcodone-acetaminophen (NORCO/VICODIN) 5-325 MG per tablet Take 1 tablet by mouth every 6 (six) hours as needed for moderate pain.  Marland Kitchen L-Lysine 500 MG TABS Take 1 tablet by mouth daily.    Marland Kitchen lidocaine (XYLOCAINE) 2 % jelly APPLY TOPICALLY AS NEEDED. APPLY TWICE DAILY AS NEEDED TO SKIN LESION ON TEMPLE  . metoprolol (LOPRESSOR) 50 MG tablet Take 1 tablet (50 mg total) by mouth 2 (two) times daily.  Marland Kitchen oxyCODONE (OXY IR/ROXICODONE) 5 MG immediate release tablet   . potassium chloride SA (KLOR-CON M20) 20 MEQ tablet Take 1 tablet (20 mEq total) by mouth  daily.  . Selenium 200 MCG TABS Take 1 tablet by mouth daily.    . traZODone (DESYREL) 50 MG tablet Take 1.5 tablets (75 mg total) by mouth at bedtime.  . [DISCONTINUED] digoxin (LANOXIN) 0.125 MG tablet TAKE 1 TABLET BY MOUTH EVERY DAY  . [DISCONTINUED] ARMOUR THYROID 60 MG tablet TAKE 1 TABLET (60 MG TOTAL) BY MOUTH DAILY.  . [DISCONTINUED] clobetasol ointment (TEMOVATE) 8.81 % Apply 1 application topically 2 (two) times daily. To vaginal area nightly for 4 weeks     Orders Placed This Encounter  Procedures  . Ambulatory referral to Home Health    No Follow-up on file.

## 2014-06-12 NOTE — Addendum Note (Signed)
Addended by: Crecencio Mc on: 06/12/2014 10:38 PM   Modules accepted: Level of Service

## 2014-06-16 ENCOUNTER — Telehealth: Payer: Self-pay | Admitting: Internal Medicine

## 2014-06-16 NOTE — Telephone Encounter (Signed)
Donna Rosario w/[facility] wanted to let the doctor know there was a mix up as to who was going to take care of the patient's physical therapy, but they will be out to visit the patient for physical therapy next week.

## 2014-07-02 ENCOUNTER — Telehealth: Payer: Self-pay

## 2014-07-02 NOTE — Telephone Encounter (Signed)
The patient is hoping to be worked in for an appointment to evaluate her ability to drive (she is hoping to get her a note clearing her to drive)  Do you want her worked in? If so, when?

## 2014-07-02 NOTE — Telephone Encounter (Signed)
Called patient to advise first available appointment would be in February patient declined stating she must be able to drive again. Advised patient I would speak with MD when she returns.

## 2014-07-05 NOTE — Telephone Encounter (Signed)
Patient stated that she is walking better and not taking the pain medication, also patient stated she is attending exercise class daily. Also advised patient as precaution to check renewal date on license before driving.

## 2014-07-05 NOTE — Telephone Encounter (Signed)
Who told her she couldn't drive? I don't recall telling her that, she has no diagnosis that would result in me not telling her to drive (syncope, seizure) .  how long has she not driven?

## 2014-07-05 NOTE — Telephone Encounter (Signed)
Patient stated she has not driven since having a serious fall requiring to take pain medication for  her right leg. [place] advised patient she may need to see MD before she can drive.

## 2014-07-05 NOTE — Telephone Encounter (Signed)
If she is no longer taking narcotics and is walking better,  She can resume driving

## 2014-08-06 ENCOUNTER — Ambulatory Visit: Payer: Medicare Other | Admitting: Cardiovascular Disease

## 2014-08-19 ENCOUNTER — Encounter: Payer: Self-pay | Admitting: Cardiovascular Disease

## 2014-08-19 ENCOUNTER — Ambulatory Visit (INDEPENDENT_AMBULATORY_CARE_PROVIDER_SITE_OTHER): Payer: Medicare Other | Admitting: Cardiovascular Disease

## 2014-08-19 VITALS — BP 138/72 | HR 73 | Ht 63.0 in | Wt 122.2 lb

## 2014-08-19 DIAGNOSIS — I5032 Chronic diastolic (congestive) heart failure: Secondary | ICD-10-CM

## 2014-08-19 DIAGNOSIS — I872 Venous insufficiency (chronic) (peripheral): Secondary | ICD-10-CM

## 2014-08-19 DIAGNOSIS — F0390 Unspecified dementia without behavioral disturbance: Secondary | ICD-10-CM

## 2014-08-19 DIAGNOSIS — I10 Essential (primary) hypertension: Secondary | ICD-10-CM

## 2014-08-19 DIAGNOSIS — I482 Chronic atrial fibrillation, unspecified: Secondary | ICD-10-CM

## 2014-08-19 NOTE — Progress Notes (Signed)
Patient ID: Donna Rosario, female    DOB: January 13, 1923, 81 y.o.   MRN: [id number]  HPI Comments: Donna Rosario is a 81 year old woman, patient of Dr. Derrel Nip, with history of  sciatic and back pain, "crushed right hip", s/p right hip replacement,  who was admitted to the hospital in early September 2011 with atrial fibrillation, started on warfarin and rate control, underlying mild dementia and confusion with polypharmacy, taken off warfarin secondary to inability to manage this herself,  previously started on Pradaxa b.i.d.  We did have 2  nursing agencies try to establish a relationship with her for polypharmacy. She  discharged them.  Mild chronic lower extremity edema. Significant confusion with her medications.  In followup today, she reports that she is doing well. She is active, goes swimming several days per week, does the balance class at [facility] Denies any recent falls. Reports she is compliant with her medications including her anticoagulation. No signs of bleeding or excessive bruising. She continues to have mild lower extremity edema, takes her diuretics Overall she has no new complaints  EKG on today's visit shows atrial fibrillation with ventricular rate 73 bpm, nonspecific ST abnormality  Other past medical history In the past, she drank a significant amount of soda/Pepsi.  Previous echocardiogram shows normal systolic function, ejection fraction 55%, mild LVH with diastolic dysfunction, mildly dilated left atrium, mild to moderate MR, moderate TR with elevated right ventricular systolic pressures consistent with mild pulmonary hypertension  Allergies  Allergen Reactions  . Sulfonamide Derivatives     Outpatient Encounter Prescriptions as of 08/19/2014  Medication Sig  . alendronate (FOSAMAX) 70 MG tablet TAKE 1 TABLET ONCE WEEKLY--TAKE WITH FULL GLASS OF WATER ON EMPTY STOMACH  . ARMOUR THYROID 60 MG tablet TAKE 1 TABLET (60 MG TOTAL) BY MOUTH DAILY.  Marland Kitchen Ascorbic Acid (VITAMIN  C) 500 MG tablet Take 500 mg by mouth daily.    . Calcium Carbonate-Vit D-Min (CALCIUM 1200 PO) Take 1 capsule by mouth daily.    . dabigatran (PRADAXA) 150 MG CAPS capsule Take 1 capsule (150 mg total) by mouth 2 (two) times daily.  . digoxin (LANOXIN) 0.125 MG tablet Take 1 tablet (125 mcg total) by mouth daily.  . felodipine (PLENDIL) 2.5 MG 24 hr tablet Take 1 tablet (2.5 mg total) by mouth daily.  . fish oil-omega-3 fatty acids 1000 MG capsule Take 1 g by mouth daily.    . furosemide (LASIX) 40 MG tablet TAKE 1 TABLET BY MOUTH 2 TIMES DAILY.  Marland Kitchen L-Lysine 500 MG TABS Take 1 tablet by mouth daily.    Marland Kitchen lidocaine (XYLOCAINE) 2 % jelly APPLY TOPICALLY AS NEEDED. APPLY TWICE DAILY AS NEEDED TO SKIN LESION ON TEMPLE  . metoprolol (LOPRESSOR) 50 MG tablet Take 1 tablet (50 mg total) by mouth 2 (two) times daily.  Marland Kitchen oxyCODONE (OXY IR/ROXICODONE) 5 MG immediate release tablet   . potassium chloride SA (KLOR-CON M20) 20 MEQ tablet Take 1 tablet (20 mEq total) by mouth daily.  . Selenium 200 MCG TABS Take 1 tablet by mouth daily.    . traZODone (DESYREL) 50 MG tablet Take 1.5 tablets (75 mg total) by mouth at bedtime.  . [DISCONTINUED] GLUCOSAMINE-CHONDROITIN-VIT D3 PO Take 1 tablet by mouth daily.    . [DISCONTINUED] HYDROcodone-acetaminophen (NORCO/VICODIN) 5-325 MG per tablet Take 1 tablet by mouth every 6 (six) hours as needed for moderate pain.    Past Medical History  Diagnosis Date  . Chronic low back pain  due to lumbosacral degenerative joint disease  . Hypertension   . Arthritis   . Osteoporosis   . Atrial fibrillation Sept. 2011    [facility] Admission for RVR  . Thyroid disease     Hypothyroidism  . Disturbance of skin sensation   . Hyperlipidemia   . Mitral valve disorders   . Sciatica   . Vascular dementia, uncomplicated     Past Surgical History  Procedure Laterality Date  . Knee surgery      Left knee  . Thyroidectomy    . Total hip arthroplasty  2011  . Joint  replacement      Right Hip Oct 2011,  Left Knee 2008 ([facility])    Social History  reports that she has never smoked. She has never used smokeless tobacco. She reports that she drinks about 3.0 oz of alcohol per week. She reports that she does not use illicit drugs.  Family History family history includes Diabetes in her mother; Heart disease in her father; Osteoporosis in her father and mother; Other in her father and mother.   Review of Systems  Constitutional: Negative.   Eyes: Negative.   Respiratory: Negative.   Cardiovascular: Negative.   Gastrointestinal: Negative.   Musculoskeletal: Positive for arthralgias and gait problem.  Skin: Negative.   Neurological: Negative.   Hematological: Negative.   Psychiatric/Behavioral: Negative.   All other systems reviewed and are negative.  BP 138/72 mmHg  Pulse 73  Ht 5\' 3"  (1.6 m)  Wt 122 lb 4 oz (55.452 kg)  BMI 21.66 kg/m2  Physical Exam  Constitutional: She is oriented to person, place, and time. She appears well-developed and well-nourished.  HENT:  Head: Normocephalic.  Nose: Nose normal.  Mouth/Throat: Oropharynx is clear and moist.  Eyes: Conjunctivae are normal. Pupils are equal, round, and reactive to light.  Neck: Normal range of motion. Neck supple. No JVD present.  Cardiovascular: Normal rate, S1 normal, S2 normal, normal heart sounds and intact distal pulses.  An irregularly irregular rhythm present. Exam reveals no gallop and no friction rub.   No murmur heard. Trace ankle edema  Pulmonary/Chest: Effort normal and breath sounds normal. No respiratory distress. She has no wheezes. She has no rales. She exhibits no tenderness.  Abdominal: Soft. Bowel sounds are normal. She exhibits no distension. There is no tenderness.  Musculoskeletal: Normal range of motion. She exhibits no edema or tenderness.  Lymphadenopathy:    She has no cervical adenopathy.  Neurological: She is alert and oriented to person, place, and  time. Coordination normal.  Skin: Skin is warm and dry. No rash noted. No erythema.  Psychiatric: She has a normal mood and affect. Her behavior is normal. Judgment and thought content normal.    Assessment and Plan  Nursing note and vitals reviewed.

## 2014-08-19 NOTE — Assessment & Plan Note (Signed)
Chronic atrial fibrillation, rate well controlled. Tolerating anticoagulation. No changes to her medications

## 2014-08-19 NOTE — Assessment & Plan Note (Signed)
Leg edema slightly worse on the left than the right. Mild pitting above the sock line. Recommended compression hose if tolerated

## 2014-08-19 NOTE — Assessment & Plan Note (Signed)
She appears relatively euvolemic on today's visit. Takes her diuretics twice a day Some of her residual leg edema likely from venous insufficiency. Recommended leg elevation and compression hose

## 2014-08-19 NOTE — Patient Instructions (Signed)
You are doing well. No medication changes were made.  Please call us if you have new issues that need to be addressed before your next appt.  Your physician wants you to follow-up in: 6 months.  You will receive a reminder letter in the mail two months in advance. If you don't receive a letter, please call our office to schedule the follow-up appointment.   

## 2014-08-19 NOTE — Assessment & Plan Note (Signed)
Blood pressure is well controlled on today's visit. No changes made to the medications. 

## 2014-08-19 NOTE — Assessment & Plan Note (Signed)
Seems to be appropriate on today's visit. Independent. No family present with her today

## 2014-08-26 ENCOUNTER — Other Ambulatory Visit: Payer: Self-pay | Admitting: Internal Medicine

## 2014-09-29 ENCOUNTER — Other Ambulatory Visit: Payer: Self-pay | Admitting: Internal Medicine

## 2014-10-28 ENCOUNTER — Other Ambulatory Visit: Payer: Self-pay | Admitting: Cardiovascular Disease

## 2014-11-05 ENCOUNTER — Telehealth: Payer: Self-pay | Admitting: Cardiovascular Disease

## 2014-11-05 NOTE — Telephone Encounter (Signed)
Samples available for pradaxa 150 mg.

## 2014-11-05 NOTE — Telephone Encounter (Signed)
Patient needs Pradaxa samples 150 mg PO 2x Daily .

## 2014-11-23 ENCOUNTER — Telehealth: Payer: Self-pay | Admitting: Cardiovascular Disease

## 2014-11-23 NOTE — Telephone Encounter (Signed)
Spoke w/ pt.  She reports that she has a lump on the left side of her chest that is red & stinging.  Advised her to have her PCP take a look at it.  She apologizes, as she states that she was confused about which doctor she was calling.

## 2014-11-23 NOTE — Telephone Encounter (Signed)
Patient needs to see Dr. Rockey Situ earlier than 6 month as recommended as she wants him to check something on her chest.  Patient was prompted for further info but this was denied.  When asked if she meant a rash on skin  And did dhe think she should call her PCP she replied that this was for the Cardiologist to decide she was not a doctor.  Patient advised that we will check with Dr. Rockey Situ and have someone call her back.

## 2014-12-14 ENCOUNTER — Telehealth: Payer: Self-pay | Admitting: Internal Medicine

## 2014-12-14 NOTE — Telephone Encounter (Signed)
Pt has refills at pharmacy

## 2014-12-14 NOTE — Telephone Encounter (Signed)
Pt request refill on Digoxin 158mcg tab. Please advise pt/msn

## 2014-12-28 ENCOUNTER — Other Ambulatory Visit: Payer: Self-pay

## 2014-12-28 ENCOUNTER — Telehealth: Payer: Self-pay

## 2014-12-28 NOTE — Telephone Encounter (Signed)
No pradaxa samples at this time. We will contact drug rep for samples.

## 2014-12-28 NOTE — Telephone Encounter (Signed)
Pt would like Pradaxa samples. Please call if any available.

## 2015-01-03 NOTE — Telephone Encounter (Signed)
Placed samples of Pradaxa at front desk for pick up.

## 2015-02-24 ENCOUNTER — Ambulatory Visit (INDEPENDENT_AMBULATORY_CARE_PROVIDER_SITE_OTHER): Payer: Medicare Other | Admitting: Cardiovascular Disease

## 2015-02-24 ENCOUNTER — Encounter: Payer: Self-pay | Admitting: Cardiovascular Disease

## 2015-02-24 VITALS — BP 138/68 | HR 63 | Ht 63.0 in | Wt 126.0 lb

## 2015-02-24 DIAGNOSIS — I10 Essential (primary) hypertension: Secondary | ICD-10-CM

## 2015-02-24 DIAGNOSIS — I482 Chronic atrial fibrillation, unspecified: Secondary | ICD-10-CM

## 2015-02-24 DIAGNOSIS — I5032 Chronic diastolic (congestive) heart failure: Secondary | ICD-10-CM

## 2015-02-24 DIAGNOSIS — F0390 Unspecified dementia without behavioral disturbance: Secondary | ICD-10-CM

## 2015-02-24 DIAGNOSIS — R609 Edema, unspecified: Secondary | ICD-10-CM

## 2015-02-24 NOTE — Patient Instructions (Addendum)
You are doing well. No medication changes were made.  Look at the price for alternative for pradaxa: Eliquis, xarelto  We will check labs today  Please call us if you have new issues that need to be addressed before your next appt.  Your physician wants you to follow-up in: 6 months.  You will receive a reminder letter in the mail two months in advance. If you don't receive a letter, please call our office to schedule the follow-up appointment.

## 2015-02-24 NOTE — Assessment & Plan Note (Signed)
Minimal edema, given her medication confusion, we'll hold the Lasix and potassium

## 2015-02-24 NOTE — Progress Notes (Signed)
Patient ID: Donna Rosario, female    DOB: 01/10/1923, 81 y.o.   MRN: [id number]  HPI Comments: Donna Rosario is a 81 year old woman, patient of Dr. Derrel Nip, with history of  sciatic and back pain, "crushed right hip", s/p right hip replacement,  who was admitted to the hospital in early September 2011 with atrial fibrillation, started on warfarin and rate control, underlying mild dementia and confusion with polypharmacy, taken off warfarin secondary to inability to manage this herself,  previously started on Pradaxa b.i.d.  We did have 2  nursing agencies try to establish a relationship with her for polypharmacy. She  discharged them.  Mild chronic lower extremity edema. Significant confusion in the past  with her medications.  In follow up today, she reports that she is not taking Lasix, she is taking potassium. When asked later she is taking furosemide, she reports that she takes one a day. We called the pharmacy and she has not picked up either prescription in 2016. She reports that she is doing well, denies any significant shortness of breath or leg edema. No complaints apart from the discoloration of her lower extremities below the knee She is requesting pradaxa samples, this will cost her 100 on a per month per the patient She is active, goes swimming several days per week, does the balance class at [facility] No recent falls  EKG on today's visit shows atrial fibrillation with rate 63 bpm no significant ST or T-wave changes  Other past medical history In the past, she drank a significant amount of soda/Pepsi.  Previous echocardiogram shows normal systolic function, ejection fraction 55%, mild LVH with diastolic dysfunction, mildly dilated left atrium, mild to moderate MR, moderate TR with elevated right ventricular systolic pressures consistent with mild pulmonary hypertension  Allergies  Allergen Reactions  . Sulfonamide Derivatives     Outpatient Encounter Prescriptions as of 02/24/2015   Medication Sig  . alendronate (FOSAMAX) 70 MG tablet TAKE 1 TABLET ONCE WEEKLY--TAKE WITH FULL GLASS OF WATER ON EMPTY STOMACH  . ARMOUR THYROID 60 MG tablet TAKE 1 TABLET (60 MG TOTAL) BY MOUTH DAILY.  Marland Kitchen Ascorbic Acid (VITAMIN C) 500 MG tablet Take 500 mg by mouth daily.    . Calcium Carbonate-Vit D-Min (CALCIUM 1200 PO) Take 1 capsule by mouth daily.    . dabigatran (PRADAXA) 150 MG CAPS capsule Take 1 capsule (150 mg total) by mouth 2 (two) times daily.  . digoxin (LANOXIN) 0.125 MG tablet TAKE 1 TABLET BY MOUTH EVERY DAY  . felodipine (PLENDIL) 2.5 MG 24 hr tablet TAKE 1 TABLET BY MOUTH EVERY DAY  . fish oil-omega-3 fatty acids 1000 MG capsule Take 1 g by mouth daily.    Marland Kitchen L-Lysine 500 MG TABS Take 1 tablet by mouth daily.    Marland Kitchen lidocaine (XYLOCAINE) 2 % jelly APPLY TOPICALLY AS NEEDED. APPLY TWICE DAILY AS NEEDED TO SKIN LESION ON TEMPLE  . metoprolol (LOPRESSOR) 50 MG tablet Take 1 tablet (50 mg total) by mouth 2 (two) times daily.  Marland Kitchen oxyCODONE (OXY IR/ROXICODONE) 5 MG immediate release tablet   . potassium chloride SA (KLOR-CON M20) 20 MEQ tablet Take 1 tablet (20 mEq total) by mouth daily.  . Selenium 200 MCG TABS Take 1 tablet by mouth daily.    . traZODone (DESYREL) 50 MG tablet Take 1.5 tablets (75 mg total) by mouth at bedtime. (Patient taking differently: Take 75 mg by mouth as needed. )  . [DISCONTINUED] alendronate (FOSAMAX) 70 MG tablet TAKE 1 TABLET  ONCE WEEKLY--TAKE WITH FULL GLASS OF WATER ON EMPTY STOMACH (Patient not taking: Reported on 02/24/2015)  . [DISCONTINUED] furosemide (LASIX) 40 MG tablet TAKE 1 TABLET BY MOUTH 2 TIMES DAILY. (Patient not taking: Reported on 02/24/2015)  . [DISCONTINUED] traZODone (DESYREL) 50 MG tablet TAKE 1 TABLET AT BEDTIME (Patient not taking: Reported on 02/24/2015)   No facility-administered encounter medications on file as of 02/24/2015.    Past Medical History  Diagnosis Date  . Chronic low back pain     due to lumbosacral degenerative joint  disease  . Hypertension   . Arthritis   . Osteoporosis   . Atrial fibrillation Sept. 2011    [facility] Admission for RVR  . Thyroid disease     Hypothyroidism  . Disturbance of skin sensation   . Hyperlipidemia   . Mitral valve disorders   . Sciatica   . Vascular dementia, uncomplicated     Past Surgical History  Procedure Laterality Date  . Knee surgery      Left knee  . Thyroidectomy    . Total hip arthroplasty  2011  . Joint replacement      Right Hip Oct 2011,  Left Knee 2008 ([facility])    Social History  reports that she has never smoked. She has never used smokeless tobacco. She reports that she drinks about 3.0 oz of alcohol per week. She reports that she does not use illicit drugs.  Family History family history includes Diabetes in her mother; Heart disease in her father; Osteoporosis in her father and mother; Other in her father and mother.   Review of Systems  Constitutional: Negative.   Eyes: Negative.   Respiratory: Negative.   Cardiovascular: Positive for leg swelling.  Gastrointestinal: Negative.   Musculoskeletal: Positive for arthralgias and gait problem.  Skin: Negative.   Neurological: Negative.   Hematological: Negative.   Psychiatric/Behavioral: Negative.   All other systems reviewed and are negative.  BP 138/68 mmHg  Pulse 63  Ht 5\' 3"  (1.6 m)  Wt 126 lb (57.153 kg)  BMI 22.33 kg/m2  Physical Exam  Constitutional: She is oriented to person, place, and time. She appears well-developed and well-nourished.  HENT:  Head: Normocephalic.  Nose: Nose normal.  Mouth/Throat: Oropharynx is clear and moist.  Eyes: Conjunctivae are normal. Pupils are equal, round, and reactive to light.  Neck: Normal range of motion. Neck supple. No JVD present.  Cardiovascular: Normal rate, S1 normal, S2 normal, normal heart sounds and intact distal pulses.  An irregularly irregular rhythm present. Exam reveals no gallop and no friction rub.   No murmur heard. Minimal  ankle edema  Pulmonary/Chest: Effort normal and breath sounds normal. No respiratory distress. She has no wheezes. She has no rales. She exhibits no tenderness.  Abdominal: Soft. Bowel sounds are normal. She exhibits no distension. There is no tenderness.  Musculoskeletal: Normal range of motion. She exhibits no edema or tenderness.  Lymphadenopathy:    She has no cervical adenopathy.  Neurological: She is alert and oriented to person, place, and time. Coordination normal.  Skin: Skin is warm and dry. No rash noted. No erythema.  Psychiatric: She has a normal mood and affect. Her behavior is normal. Judgment and thought content normal.    Assessment and Plan  Nursing note and vitals reviewed.

## 2015-02-24 NOTE — Assessment & Plan Note (Signed)
Blood pressure is well controlled on today's visit. No changes made to the medications. 

## 2015-02-24 NOTE — Assessment & Plan Note (Signed)
Chronic atrial fibrillation, rate relatively well-controlled. She is requesting samples of pradaxa. I'm concerned about her medication confusion which is not a new issue. Unclear if she is taking her anticoagulation on a regular basis given the cost which she reports is $100 per month. One month of samples provided to her today

## 2015-02-24 NOTE — Assessment & Plan Note (Signed)
Minimal leg edema on today's visit We did check a BMP today as she reported she was on potassium with no Lasix We did call pharmacy later after she left and she was not filling either medication for all of 2016 In the past she has refused visiting nurses. We called [facility], because she is independent living, they do not have a nurse that can verify her medications but perhaps this could be arranged. We will them him back to discuss. She does not have any family in town.

## 2015-02-24 NOTE — Assessment & Plan Note (Signed)
She has been relatively stable but now continues to be confused with her medications. We'll hold the Lasix, potassium as she appears relatively euvolemic. She has not filled these anyway for 2016

## 2015-02-25 ENCOUNTER — Telehealth: Payer: Self-pay

## 2015-02-25 LAB — BASIC METABOLIC PANEL
BUN/Creatinine Ratio: 25 (ref 11–26)
BUN: 23 mg/dL (ref 10–36)
CHLORIDE: 104 mmol/L (ref 97–108)
CO2: 21 mmol/L (ref 18–29)
Calcium: 9.2 mg/dL (ref 8.7–10.3)
Creatinine, Ser: 0.91 mg/dL (ref 0.57–1.00)
GFR calc non Af Amer: 56 mL/min/{1.73_m2} — ABNORMAL LOW (ref >59)
GFR, EST AFRICAN AMERICAN: 64 mL/min/{1.73_m2} (ref >59)
Glucose: 98 mg/dL (ref 65–99)
Potassium: 4.6 mmol/L (ref 3.5–5.2)
SODIUM: 141 mmol/L (ref 134–144)

## 2015-02-25 NOTE — Telephone Encounter (Signed)
Spoke with Danae Chen Warehouse manager) at [facility] [employer], she stated Ms. Cove last refilled Furosemide in 06/2013 and the potassium was last refilled in 2014. Erick (pharmacist) placed a hold on any refills for furosemide and potassium for now.

## 2015-04-25 ENCOUNTER — Other Ambulatory Visit: Payer: Self-pay | Admitting: Internal Medicine

## 2015-05-05 ENCOUNTER — Other Ambulatory Visit: Payer: Self-pay | Admitting: Internal Medicine

## 2015-05-06 NOTE — Telephone Encounter (Signed)
NEEDS APPT 

## 2015-05-30 ENCOUNTER — Ambulatory Visit (INDEPENDENT_AMBULATORY_CARE_PROVIDER_SITE_OTHER): Payer: Medicare Other | Admitting: Internal Medicine

## 2015-05-30 ENCOUNTER — Encounter: Payer: Self-pay | Admitting: Internal Medicine

## 2015-05-30 VITALS — BP 160/88 | HR 67 | Temp 97.7°F | Resp 14 | Ht 63.0 in | Wt 127.5 lb

## 2015-05-30 DIAGNOSIS — I8311 Varicose veins of right lower extremity with inflammation: Secondary | ICD-10-CM

## 2015-05-30 DIAGNOSIS — E559 Vitamin D deficiency, unspecified: Secondary | ICD-10-CM | POA: Diagnosis not present

## 2015-05-30 DIAGNOSIS — I10 Essential (primary) hypertension: Secondary | ICD-10-CM

## 2015-05-30 DIAGNOSIS — E039 Hypothyroidism, unspecified: Secondary | ICD-10-CM

## 2015-05-30 DIAGNOSIS — I872 Venous insufficiency (chronic) (peripheral): Secondary | ICD-10-CM | POA: Insufficient documentation

## 2015-05-30 MED ORDER — TRIAMCINOLONE ACETONIDE 0.5 % EX OINT: TOPICAL_OINTMENT | CUTANEOUS | Status: DC

## 2015-05-30 NOTE — Patient Instructions (Addendum)
Apply the triamcinolone steroid ointment once daily at night to your leg rash and cover the area with the bandage  Before you go to bed     Do this daily until  it stops itching  Apply Aveeno moisturizer daily in the morning to both legs,  Before you put on your stockings  Wear your compression stockings during the day and take them off at night !!

## 2015-05-30 NOTE — Progress Notes (Signed)
Subjective:  Patient ID: [name], female    DOB: Oct 23, 1922  Age: 81 y.o. MRN: [id number]  CC: The primary encounter diagnosis was Venous stasis dermatitis of right lower extremity. Diagnoses of HYPERTENSION, BENIGN, Hypothyroidism, unspecified hypothyroidism type, and Vitamin D deficiency were also pertinent to this visit.  HPI Brihana Zale presents for bilateral leg edema and pruritic rash above right ankle .  Last seen Dec 2015.  History of venous insufficiency  diastolic dysfunction,  History of recurrent skin reactions of unclear etiology complicated by dementia  Has been itching and peeling for 6 weeks,  Posterior ankle on right leg.  No bleeding,  No drainage.  Still exercising regularly, appetite good.  No recent falls.    Wears compression stockings as often as she can ,  Averages 5 days per week    Has not had labs done for thyroid since July 2015   Outpatient Prescriptions Prior to Visit  Medication Sig Dispense Refill  . alendronate (FOSAMAX) 70 MG tablet TAKE 1 TABLET ONCE WEEKLY--TAKE WITH FULL GLASS OF WATER ON EMPTY STOMACH 12 tablet 0  . ARMOUR THYROID 60 MG tablet TAKE 1 TABLET (60 MG TOTAL) BY MOUTH DAILY. 90 tablet 3  . Ascorbic Acid (VITAMIN C) 500 MG tablet Take 500 mg by mouth daily.      . Calcium Carbonate-Vit D-Min (CALCIUM 1200 PO) Take 1 capsule by mouth daily.      . dabigatran (PRADAXA) 150 MG CAPS capsule Take 1 capsule (150 mg total) by mouth 2 (two) times daily. 60 capsule 3  . digoxin (LANOXIN) 0.125 MG tablet TAKE 1 TABLET BY MOUTH EVERY DAY 30 tablet 5  . felodipine (PLENDIL) 2.5 MG 24 hr tablet TAKE 1 TABLET BY MOUTH EVERY DAY 90 tablet 3  . fish oil-omega-3 fatty acids 1000 MG capsule Take 1 g by mouth daily.      Marland Kitchen L-Lysine 500 MG TABS Take 1 tablet by mouth daily.      Marland Kitchen lidocaine (XYLOCAINE) 2 % jelly APPLY TOPICALLY AS NEEDED. APPLY TWICE DAILY AS NEEDED TO SKIN LESION ON TEMPLE 30 mL 1  . metoprolol (LOPRESSOR) 50 MG tablet Take 1 tablet  (50 mg total) by mouth 2 (two) times daily. 180 tablet 3  . oxyCODONE (OXY IR/ROXICODONE) 5 MG immediate release tablet     . potassium chloride SA (KLOR-CON M20) 20 MEQ tablet Take 1 tablet (20 mEq total) by mouth daily. 90 tablet 3  . Selenium 200 MCG TABS Take 1 tablet by mouth daily.      . traZODone (DESYREL) 50 MG tablet Take 1.5 tablets (75 mg total) by mouth at bedtime. (Patient taking differently: Take 75 mg by mouth as needed. ) 135 tablet 2  . triamcinolone ointment (KENALOG) 0.5 % APPLY TOPICALLY 2 TIMES DAILY TO FACIAL RASH UNTIL RESOLVED 30 g 1   No facility-administered medications prior to visit.    Review of Systems;  Patient denies headache, fevers, malaise, unintentional weight loss, skin rash, eye pain, sinus congestion and sinus pain, sore throat, dysphagia,  hemoptysis , cough, dyspnea, wheezing, chest pain, palpitations, orthopnea, edema, abdominal pain, nausea, melena, diarrhea, constipation, flank pain, dysuria, hematuria, urinary  Frequency, nocturia, numbness, tingling, seizures,  Focal weakness, Loss of consciousness,  Tremor, insomnia, depression, anxiety, and suicidal ideation.      Objective:  BP 160/88 mmHg  Pulse 67  Temp(Src) 97.7 F (36.5 C) (Oral)  Resp 14  Ht 5\' 3"  (1.6 m)  Wt 127 lb 8 oz (  57.834 kg)  BMI 22.59 kg/m2  SpO2 98%  BP Readings from Last 3 Encounters:  05/30/15 160/88  02/24/15 138/68  08/19/14 138/72    Wt Readings from Last 3 Encounters:  05/30/15 127 lb 8 oz (57.834 kg)  02/24/15 126 lb (57.153 kg)  08/19/14 122 lb 4 oz (55.452 kg)    General appearance: alert, cooperative and appears stated age Ears: normal TM's and external ear canals both ears Throat: lips, mucosa, and tongue normal; teeth and gums normal Neck: no adenopathy, no carotid bruit, supple, symmetrical, trachea midline and thyroid not enlarged, symmetric, no tenderness/mass/nodules Back: symmetric, no curvature. ROM normal. No CVA tenderness. Lungs: clear to  auscultation bilaterally Heart: regular rate and rhythm, S1, S2 normal, no murmur, click, rub or gallop Abdomen: soft, non-tender; bowel sounds normal; no masses,  no organomegaly Pulses: 2+ and symmetric. Mild bilateral edeam C/w VI  Skin: venous stasis changes,   3 cm band of desquamation posterior  Calf  right leg.  No ulceration. . No rashes or lesions Lymph nodes: Cervical, supraclavicular, and axillary nodes normal.  No results found for: HGBA1C  Lab Results  Component Value Date   CREATININE 0.85 05/30/2015   CREATININE 0.91 02/24/2015   CREATININE 0.95 06/08/2014    Lab Results  Component Value Date   WBC 10.8 06/08/2014   HGB 14.0 06/08/2014   HCT 43.1 06/08/2014   PLT 235 06/08/2014   GLUCOSE 82 05/30/2015   CHOL 215* 01/12/2014   TRIG 165.0* 01/12/2014   HDL 54.70 01/12/2014   LDLDIRECT 121* 01/30/2013   LDLCALC 127* 01/12/2014   ALT 18 05/30/2015   AST 25 05/30/2015   NA 137 05/30/2015   K 3.9 05/30/2015   CL 103 05/30/2015   CREATININE 0.85 05/30/2015   BUN 24* 05/30/2015   CO2 25 05/30/2015   TSH 2.24 05/30/2015    No results found.  Assessment & Plan:   Problem List Items Addressed This Visit    Venous stasis dermatitis of right lower extremity - Primary    Advised to use triamcinolone daily and aveeno daily on the area of desquamation and continue to use compression stockings to managed edema.       Hypothyroidism    Thyroid function is WNL on current dose.  No current changes needed.   Lab Results  Component Value Date   TSH 2.24 05/30/2015         Relevant Orders   TSH (Completed)   Vitamin D deficiency    Your vitamin D is low, which can increase your risk of weak bones and fractures and interfere with your body's ability to absorb the calcium in your diet.   I am calling in a megadose of Vit D to take once weekly for a total of 3 months,        Relevant Orders   VITAMIN D 25 Hydroxy (Vit-D Deficiency, Fractures) (Completed)    HYPERTENSION, BENIGN   Relevant Orders   Comprehensive metabolic panel (Completed)      I have changed Ms. Cathers's triamcinolone ointment. I am also having her start on ergocalciferol. Additionally, I am having her maintain her Calcium Carbonate-Vit D-Min (CALCIUM 1200 PO), fish oil-omega-3 fatty acids, L-Lysine, Selenium, vitamin C, traZODone, potassium chloride SA, metoprolol, dabigatran, ARMOUR THYROID, oxyCODONE, digoxin, felodipine, lidocaine, and alendronate.  Meds ordered this encounter  Medications  . triamcinolone ointment (KENALOG) 0.5 %    Sig: APPLY TOPICALLY 2 TIMES DAILY TO leg rash  UNTIL RESOLVED  Dispense:  30 g    Refill:  1  . ergocalciferol (DRISDOL) 50000 UNITS capsule    Sig: Take 1 capsule (50,000 Units total) by mouth once a week.    Dispense:  4 capsule    Refill:  2    Medications Discontinued During This Encounter  Medication Reason  . triamcinolone ointment (KENALOG) 0.5 % Reorder    Follow-up: No Follow-up on file.   Crecencio Mc, MD

## 2015-05-30 NOTE — Progress Notes (Signed)
Pre-visit discussion using our clinic review tool. No additional management support is needed unless otherwise documented below in the visit note.  

## 2015-05-31 LAB — COMPREHENSIVE METABOLIC PANEL
ALT: 18 U/L (ref 0–35)
AST: 25 U/L (ref 0–37)
Albumin: 4 g/dL (ref 3.5–5.2)
Alkaline Phosphatase: 53 U/L (ref 39–117)
BUN: 24 mg/dL — ABNORMAL HIGH (ref 6–23)
CO2: 25 meq/L (ref 19–32)
Calcium: 9.6 mg/dL (ref 8.4–10.5)
Chloride: 103 mEq/L (ref 96–112)
Creatinine, Ser: 0.85 mg/dL (ref 0.40–1.20)
GFR: 66.7 mL/min (ref >60.00)
GLUCOSE: 82 mg/dL (ref 70–99)
POTASSIUM: 3.9 meq/L (ref 3.5–5.1)
Sodium: 137 mEq/L (ref 135–145)
Total Bilirubin: 0.5 mg/dL (ref 0.2–1.2)
Total Protein: 7 g/dL (ref 6.0–8.3)

## 2015-05-31 LAB — TSH: TSH: 2.24 u[IU]/mL (ref 0.35–4.50)

## 2015-05-31 LAB — VITAMIN D 25 HYDROXY (VIT D DEFICIENCY, FRACTURES): VITD: 22.8 ng/mL — AB (ref 30.00–100.00)

## 2015-06-01 ENCOUNTER — Encounter: Payer: Self-pay | Admitting: *Deleted

## 2015-06-01 MED ORDER — ERGOCALCIFEROL 1.25 MG (50000 UT) PO CAPS: 50000.0000 [IU] | ORAL_CAPSULE | ORAL | Status: DC

## 2015-06-01 NOTE — Assessment & Plan Note (Signed)
Advised to use triamcinolone daily and aveeno daily on the area of desquamation and continue to use compression stockings to managed edema.

## 2015-06-01 NOTE — Assessment & Plan Note (Signed)
Your vitamin D is low, which can increase your risk of weak bones and fractures and interfere with your body's ability to absorb the calcium in your diet.   I am calling in a megadose of Vit D to take once weekly for a total of 3 months,

## 2015-06-01 NOTE — Assessment & Plan Note (Signed)
Thyroid function is WNL on current dose.  No current changes needed.   Lab Results  Component Value Date   TSH 2.24 05/30/2015

## 2015-06-24 ENCOUNTER — Other Ambulatory Visit: Payer: Self-pay | Admitting: Internal Medicine

## 2015-06-24 NOTE — Telephone Encounter (Signed)
Ok to refill,  Refill sent  

## 2015-06-24 NOTE — Telephone Encounter (Signed)
Please advise refill, it was prescribed on 12/5 for her legs. thanks

## 2015-06-28 ENCOUNTER — Telehealth: Payer: Self-pay | Admitting: Internal Medicine

## 2015-06-28 ENCOUNTER — Other Ambulatory Visit: Payer: Self-pay

## 2015-06-28 MED ORDER — METOPROLOL TARTRATE 50 MG PO TABS: 50.0000 mg | ORAL_TABLET | Freq: Two times a day (BID) | ORAL | Status: DC

## 2015-06-28 NOTE — Telephone Encounter (Signed)
Pt came into office asking for her metoprolol (LOPRESSOR) 50 MG tablet to be refilled.  Please call her at home.

## 2015-06-28 NOTE — Telephone Encounter (Signed)
Called patient.  Advised of refill via VM.

## 2015-07-12 ENCOUNTER — Telehealth: Payer: Self-pay | Admitting: *Deleted

## 2015-07-12 ENCOUNTER — Telehealth: Payer: Self-pay | Admitting: Internal Medicine

## 2015-07-12 NOTE — Telephone Encounter (Signed)
Lidocaine is not an appropriate treatment for a skin lesion unless it is shingles . i do not recall the lesion,  Can you find out more info?

## 2015-07-12 NOTE — Telephone Encounter (Signed)
Patient requested a medication refill for Lidocaine HCL  . Pharmacy [facility] [address]

## 2015-07-12 NOTE — Telephone Encounter (Signed)
Pt need refill on lidocaine jelly for lesion on temple. Pt last OV 05/30/15, last filled 04/26/15 41ml 1 Refill. Please advise if okay to refill./tvw

## 2015-07-12 NOTE — Telephone Encounter (Signed)
Do not refill lidocaine.  i

## 2015-07-12 NOTE — Telephone Encounter (Signed)
The lesion from researching the chart was to the facial area tried to contact patient and patient keeps hanging up stating she does not except phone calls, this is an automatic refill request from pharmacy. Medication traced back to 01/27/14  Was given after the removal of basal cell carcinoma with triamcinolone cream. Patient was verry confused on phone I could not get patient to understand why I was calling.

## 2015-07-13 NOTE — Telephone Encounter (Signed)
Refill denied 

## 2015-07-25 ENCOUNTER — Other Ambulatory Visit: Payer: Self-pay | Admitting: *Deleted

## 2015-08-12 ENCOUNTER — Other Ambulatory Visit: Payer: Self-pay | Admitting: Internal Medicine

## 2015-08-26 ENCOUNTER — Ambulatory Visit (INDEPENDENT_AMBULATORY_CARE_PROVIDER_SITE_OTHER): Payer: Medicare Other | Admitting: Cardiovascular Disease

## 2015-08-26 ENCOUNTER — Encounter: Payer: Self-pay | Admitting: Cardiovascular Disease

## 2015-08-26 VITALS — BP 144/84 | HR 70 | Ht 63.0 in | Wt 128.5 lb

## 2015-08-26 DIAGNOSIS — I5032 Chronic diastolic (congestive) heart failure: Secondary | ICD-10-CM

## 2015-08-26 DIAGNOSIS — R600 Localized edema: Secondary | ICD-10-CM

## 2015-08-26 DIAGNOSIS — I482 Chronic atrial fibrillation, unspecified: Secondary | ICD-10-CM

## 2015-08-26 DIAGNOSIS — F0390 Unspecified dementia without behavioral disturbance: Secondary | ICD-10-CM

## 2015-08-26 DIAGNOSIS — I10 Essential (primary) hypertension: Secondary | ICD-10-CM | POA: Diagnosis not present

## 2015-08-26 NOTE — Assessment & Plan Note (Signed)
Trace pitting edema above the sock line bilaterally, only mild. Possibly exacerbated by calcium channel blocker/felodipine Recommended knee-high compression hose

## 2015-08-26 NOTE — Assessment & Plan Note (Signed)
Blood pressure is well controlled on today's visit. No changes made to the medications. 

## 2015-08-26 NOTE — Assessment & Plan Note (Signed)
Minimal leg edema, unable to exclude venous insufficiency. Recommended compression hose, leg elevation.. Otherwise appears relatively euvolemic Currently not taking Lasix and potassium. Trying to minimize her polypharmacy

## 2015-08-26 NOTE — Patient Instructions (Signed)
You are doing well.  Please stop the digoxin  Please call us if you have new issues that need to be addressed before your next appt.  Your physician wants you to follow-up in: 6 months.  You will receive a reminder letter in the mail two months in advance. If you don't receive a letter, please call our office to schedule the follow-up appointment.

## 2015-08-26 NOTE — Assessment & Plan Note (Signed)
Heart rate well controlled, Recommended she stay on metoprolol, we will stop digoxin in an effort to minimize polypharmacy Will likely have adequate rate control on the metoprolol

## 2015-08-26 NOTE — Progress Notes (Signed)
Patient ID: Donna Rosario, female    DOB: 05/09/1923, 82 y.o.   MRN: [id number]  HPI Comments: [name] is a 82 year old woman, patient of Dr. Derrel Nip, with history of  sciatic and back pain, "crushed right hip", s/p right hip replacement,  who was admitted to the hospital in early September 2011 with atrial fibrillation, started on warfarin and rate control, underlying mild dementia and confusion with polypharmacy, taken off warfarin secondary to inability to manage this herself,  previously started on Pradaxa b.i.d.  We did have 2  nursing agencies try to establish a relationship with her for polypharmacy. She  discharged them.  Mild chronic lower extremity edema. Significant confusion in the past  with her medications. She presents today for follow-up of her atrial fibrillation  [facility] [place] In follow-up today, she reports that she is doing well, she does have some bruising on her hands from the blood thinner Continues her exercise 5 days per week Denies any recent falls No new complaints.  requesting pradaxa samples  EKG on today's visit shows atrial fibrillation with rate 70 bpm no significant ST or T-wave changes  Other past medical history In the past, she drank a significant amount of soda/Pepsi.  Previous echocardiogram shows normal systolic function, ejection fraction 55%, mild LVH with diastolic dysfunction, mildly dilated left atrium, mild to moderate MR, moderate TR with elevated right ventricular systolic pressures consistent with mild pulmonary hypertension  Allergies  Allergen Reactions  . Sulfonamide Derivatives     Outpatient Encounter Prescriptions as of 08/26/2015  Medication Sig  . alendronate (FOSAMAX) 70 MG tablet TAKE 1 TABLET ONCE WEEKLY--TAKE WITH FULL GLASS OF WATER ON EMPTY STOMACH  . ARMOUR THYROID 60 MG tablet TAKE 1 TABLET (60 MG TOTAL) BY MOUTH DAILY.  Marland Kitchen Ascorbic Acid (VITAMIN C) 500 MG tablet Take 500 mg by mouth daily.    . Calcium  Carbonate-Vit D-Min (CALCIUM 1200 PO) Take 1 capsule by mouth daily.    . dabigatran (PRADAXA) 150 MG CAPS capsule Take 1 capsule (150 mg total) by mouth 2 (two) times daily.  . ergocalciferol (DRISDOL) 50000 UNITS capsule Take 1 capsule (50,000 Units total) by mouth once a week.  . felodipine (PLENDIL) 2.5 MG 24 hr tablet TAKE 1 TABLET BY MOUTH EVERY DAY  . fish oil-omega-3 fatty acids 1000 MG capsule Take 1 g by mouth daily.    Marland Kitchen L-Lysine 500 MG TABS Take 1 tablet by mouth daily.    . metoprolol (LOPRESSOR) 50 MG tablet Take 1 tablet (50 mg total) by mouth 2 (two) times daily.  Marland Kitchen oxyCODONE (OXY IR/ROXICODONE) 5 MG immediate release tablet   . potassium chloride SA (KLOR-CON M20) 20 MEQ tablet Take 1 tablet (20 mEq total) by mouth daily.  . Selenium 200 MCG TABS Take 1 tablet by mouth daily.    . traZODone (DESYREL) 50 MG tablet Take 1.5 tablets (75 mg total) by mouth at bedtime. (Patient taking differently: Take 75 mg by mouth as needed. )  . triamcinolone ointment (KENALOG) 0.5 % APPLY TOPICALLY 2 TIMES DAILY TO leg rash  UNTIL RESOLVED  . triamcinolone ointment (KENALOG) 0.5 % APPLY TOPICALLY 2 TIMES DAILY TO FACIAL RASH UNTIL RESOLVED  . [DISCONTINUED] digoxin (LANOXIN) 0.125 MG tablet TAKE 1 TABLET BY MOUTH EVERY DAY   No facility-administered encounter medications on file as of 08/26/2015.    Past Medical History  Diagnosis Date  . Chronic low back pain     due to lumbosacral degenerative  joint disease  . Hypertension   . Arthritis   . Osteoporosis   . Atrial fibrillation [facility]) Sept. 2011    [facility] Admission for RVR  . Thyroid disease     Hypothyroidism  . Disturbance of skin sensation   . Hyperlipidemia   . Mitral valve disorders   . Sciatica   . Vascular dementia, uncomplicated     Past Surgical History  Procedure Laterality Date  . Knee surgery      Left knee  . Thyroidectomy    . Total hip arthroplasty  2011  . Joint replacement      Right Hip Oct 2011,  Left Knee  2008 ([facility])    Social History  reports that she has never smoked. She has never used smokeless tobacco. She reports that she drinks about 3.0 oz of alcohol per week. She reports that she does not use illicit drugs.  Family History family history includes Diabetes in her mother; Heart disease in her father; Osteoporosis in her father and mother; Other in her father and mother.   Review of Systems  Constitutional: Negative.   Eyes: Negative.   Respiratory: Negative.   Cardiovascular: Positive for leg swelling.  Gastrointestinal: Negative.   Musculoskeletal: Positive for arthralgias and gait problem.  Skin: Negative.   Neurological: Negative.   Hematological: Negative.   Psychiatric/Behavioral: Negative.   All other systems reviewed and are negative.  BP 144/84 mmHg  Pulse 70  Ht 5\' 3"  (1.6 m)  Wt 128 lb 8 oz (58.287 kg)  BMI 22.77 kg/m2  Physical Exam  Constitutional: She is oriented to person, place, and time. She appears well-developed and well-nourished.  HENT:  Head: Normocephalic.  Nose: Nose normal.  Mouth/Throat: Oropharynx is clear and moist.  Eyes: Conjunctivae are normal. Pupils are equal, round, and reactive to light.  Neck: Normal range of motion. Neck supple. No JVD present.  Cardiovascular: Normal rate, S1 normal, S2 normal, normal heart sounds and intact distal pulses.  An irregularly irregular rhythm present. Exam reveals no gallop and no friction rub.   No murmur heard. Trace minimally pitting edema above the sock line to the mid shin  Pulmonary/Chest: Effort normal and breath sounds normal. No respiratory distress. She has no wheezes. She has no rales. She exhibits no tenderness.  Abdominal: Soft. Bowel sounds are normal. She exhibits no distension. There is no tenderness.  Musculoskeletal: Normal range of motion. She exhibits no edema or tenderness.  Lymphadenopathy:    She has no cervical adenopathy.  Neurological: She is alert and oriented to person,  place, and time. Coordination normal.  Skin: Skin is warm and dry. No rash noted. No erythema.  Psychiatric: She has a normal mood and affect. Her behavior is normal. Judgment and thought content normal.    Assessment and Plan  Nursing note and vitals reviewed.

## 2015-08-26 NOTE — Assessment & Plan Note (Signed)
Previous issues with polypharmacy, otherwise exercising almost daily, by her account is functioning well at [facility]

## 2015-08-31 ENCOUNTER — Encounter: Payer: Self-pay | Admitting: *Deleted

## 2015-08-31 NOTE — Progress Notes (Signed)
This encounter was created in error - please disregard.

## 2015-09-06 ENCOUNTER — Ambulatory Visit: Payer: Medicare Other | Admitting: Internal Medicine

## 2015-09-07 ENCOUNTER — Telehealth: Payer: Self-pay | Admitting: Internal Medicine

## 2015-09-07 MED ORDER — TRAZODONE HCL 50 MG PO TABS: 75.0000 mg | ORAL_TABLET | Freq: Every day | ORAL | Status: DC

## 2015-09-07 NOTE — Telephone Encounter (Signed)
filled

## 2015-09-07 NOTE — Telephone Encounter (Addendum)
Pt needs refill on traZODone (DESYREL) 50 MG tablet.. [facility] [address] [place].. Please advise pt when this has been filled.. Thanks

## 2015-09-19 ENCOUNTER — Telehealth: Payer: Self-pay | Admitting: Internal Medicine

## 2015-09-19 MED ORDER — LIDOCAINE HCL 2 % EX GEL: 1.0000 "application " | CUTANEOUS | Status: DC | PRN

## 2015-09-19 NOTE — Telephone Encounter (Signed)
Pt needs refill on Lidocaine HCL 2% jelly [facility]  [address] Please advise pt when sent

## 2015-09-19 NOTE — Telephone Encounter (Signed)
Pt is requesting a refill on Lidocaine Jelly. Please advise, thanks

## 2015-09-19 NOTE — Telephone Encounter (Signed)
REFILL SENT 

## 2015-11-04 ENCOUNTER — Other Ambulatory Visit: Payer: Self-pay | Admitting: Internal Medicine

## 2015-11-23 ENCOUNTER — Other Ambulatory Visit: Payer: Self-pay | Admitting: Internal Medicine

## 2015-11-28 ENCOUNTER — Other Ambulatory Visit: Payer: Self-pay | Admitting: Cardiovascular Disease

## 2016-01-25 ENCOUNTER — Telehealth: Payer: Self-pay | Admitting: Internal Medicine

## 2016-01-25 MED ORDER — LIDOCAINE HCL 2 % EX GEL: 1.0000 "application " | CUTANEOUS | 0 refills | Status: DC | PRN

## 2016-01-25 NOTE — Telephone Encounter (Signed)
rx sent, thanks.

## 2016-01-25 NOTE — Telephone Encounter (Signed)
Pt needs a refill on lidocaine (XYLOCAINE) 2 % jelly [facility] [address] Please advise pt

## 2016-01-27 ENCOUNTER — Encounter: Payer: Self-pay | Admitting: Cardiovascular Disease

## 2016-01-27 ENCOUNTER — Ambulatory Visit (INDEPENDENT_AMBULATORY_CARE_PROVIDER_SITE_OTHER): Payer: Medicare Other | Admitting: Cardiovascular Disease

## 2016-01-27 ENCOUNTER — Other Ambulatory Visit: Payer: Self-pay | Admitting: Internal Medicine

## 2016-01-27 VITALS — BP 140/80 | HR 76 | Ht 67.0 in | Wt 127.0 lb

## 2016-01-27 DIAGNOSIS — I10 Essential (primary) hypertension: Secondary | ICD-10-CM

## 2016-01-27 DIAGNOSIS — I482 Chronic atrial fibrillation, unspecified: Secondary | ICD-10-CM

## 2016-01-27 DIAGNOSIS — I5032 Chronic diastolic (congestive) heart failure: Secondary | ICD-10-CM | POA: Diagnosis not present

## 2016-01-27 DIAGNOSIS — F0390 Unspecified dementia without behavioral disturbance: Secondary | ICD-10-CM

## 2016-01-27 DIAGNOSIS — I519 Heart disease, unspecified: Secondary | ICD-10-CM | POA: Diagnosis not present

## 2016-01-27 DIAGNOSIS — R42 Dizziness and giddiness: Secondary | ICD-10-CM | POA: Diagnosis not present

## 2016-01-27 NOTE — Progress Notes (Signed)
Cardiology Office Note  Date:  01/27/2016   ID:  Donna Rosario, DOB 03/23/1923, MRN [id number]  PCP:  Crecencio Mc, MD   Chief Complaint  Patient presents with  . Other    6 month follow up. "doing well."     HPI:  Donna Rosario is a 82 year old woman, patient of Dr. Derrel Nip, with history of  sciatic and back pain, "crushed right hip", s/p right hip replacement,  who was admitted to the hospital in early September 2011 with atrial fibrillation, started on warfarin and rate control, underlying mild dementia and confusion with polypharmacy, taken off warfarin secondary to inability to manage this herself,  previously started on Pradaxa b.i.d.  We did have 2  nursing agencies try to establish a relationship with her for polypharmacy. She  discharged them.  Mild chronic lower extremity edema. Significant confusion in the past  with her medications. She presents today for follow-up of her atrial fibrillation  In follow-up today, she reports that she is doing relatively well She does have some Leg weakness She has had falls, details unclear Gets dizzy at times, when she does too much reading, has to look up Also has dizziness when she is standing and she looks up for too long Considers herself a Swimmer , goes swimming 1 x per week Reports doing exercise 5 days per week, details unclear Currently lives [facility] [place]  requesting pradaxa samples, as on her last clinic visit. Reports it is expensive Reports she has no family in town to help take care of her  EKG on today's visit shows atrial fibrillation with rate 76 bpm no significant ST or T-wave changes  Other past medical history In the past, she drank a significant amount of soda/Pepsi.  Previous echocardiogram shows normal systolic function, ejection fraction 55%, mild LVH with diastolic dysfunction, mildly dilated left atrium, mild to moderate MR, moderate TR with elevated right ventricular systolic pressures consistent with mild  pulmonary hypertension  PMH:   has a past medical history of Arthritis; Atrial fibrillation [facility]) (Sept. 2011); Chronic low back pain; Disturbance of skin sensation; Hyperlipidemia; Hypertension; Mitral valve disorders; Osteoporosis; Sciatica; Thyroid disease; and Vascular dementia, uncomplicated.  PSH:    Past Surgical History:  Procedure Laterality Date  . JOINT REPLACEMENT     Right Hip Oct 2011,  Left Knee 2008 ([place])  . KNEE SURGERY     Left knee  . THYROIDECTOMY    . TOTAL HIP ARTHROPLASTY  2011    Current Outpatient Prescriptions  Medication Sig Dispense Refill  . ARMOUR THYROID 60 MG tablet TAKE 1 TABLET (60 MG TOTAL) BY MOUTH DAILY. 90 tablet 3  . Ascorbic Acid (VITAMIN C) 500 MG tablet Take 500 mg by mouth daily.      . Calcium Carbonate-Vit D-Min (CALCIUM 1200 PO) Take 1 capsule by mouth daily.      . dabigatran (PRADAXA) 150 MG CAPS capsule Take 1 capsule (150 mg total) by mouth 2 (two) times daily. 60 capsule 3  . digoxin (LANOXIN) 0.125 MG tablet TAKE 1 TABLET BY MOUTH EVERY DAY 90 tablet 0  . ergocalciferol (DRISDOL) 50000 UNITS capsule Take 1 capsule (50,000 Units total) by mouth once a week. 4 capsule 2  . felodipine (PLENDIL) 2.5 MG 24 hr tablet TAKE 1 TABLET BY MOUTH EVERY DAY 90 tablet 3  . felodipine (PLENDIL) 2.5 MG 24 hr tablet TAKE 1 TABLET BY MOUTH EVERY DAY 90 tablet 3  . fish oil-omega-3 fatty acids 1000 MG capsule  Take 1 g by mouth daily.      Marland Kitchen L-Lysine 500 MG TABS Take 1 tablet by mouth daily.      Marland Kitchen lidocaine (XYLOCAINE) 2 % jelly Apply 1 application topically as needed. 30 mL 0  . metoprolol (LOPRESSOR) 50 MG tablet Take 1 tablet (50 mg total) by mouth 2 (two) times daily. 180 tablet 3  . oxyCODONE (OXY IR/ROXICODONE) 5 MG immediate release tablet     . potassium chloride SA (KLOR-CON M20) 20 MEQ tablet Take 1 tablet (20 mEq total) by mouth daily. 90 tablet 3  . Selenium 200 MCG TABS Take 1 tablet by mouth daily.      . traZODone (DESYREL) 50 MG  tablet TAKE 1.5 TABLETS (75 MG TOTAL) BY MOUTH AT BEDTIME. 135 tablet 1  . triamcinolone ointment (KENALOG) 0.5 % APPLY TOPICALLY 2 TIMES DAILY TO leg rash  UNTIL RESOLVED 30 g 1  . triamcinolone ointment (KENALOG) 0.5 % APPLY TOPICALLY 2 TIMES DAILY TO FACIAL RASH UNTIL RESOLVED 30 g 1  . alendronate (FOSAMAX) 70 MG tablet TAKE 1 TABLET ONCE WEEKLY--TAKE WITH FULL GLASS OF WATER ON EMPTY STOMACH 12 tablet 0   No current facility-administered medications for this visit.      Allergies:   Sulfonamide derivatives   Social History:  The patient  reports that she has never smoked. She has never used smokeless tobacco. She reports that she drinks about 3.0 oz of alcohol per week . She reports that she does not use drugs.   Family History:   family history includes Diabetes in her mother; Heart disease in her father; Osteoporosis in her father and mother; Other in her father and mother.    Review of Systems: Review of Systems  Constitutional: Negative.   Respiratory: Negative.   Cardiovascular: Positive for leg swelling.  Gastrointestinal: Negative.   Musculoskeletal: Negative.   Neurological: Positive for dizziness.  Psychiatric/Behavioral: Negative.   All other systems reviewed and are negative.    PHYSICAL EXAM: VS:  BP 140/80 (BP Location: Left Arm, Patient Position: Sitting, Cuff Size: Normal)   Pulse 76   Ht 5\' 7"  (1.702 m)   Wt 127 lb (57.6 kg)   BMI 19.89 kg/m  , BMI Body mass index is 19.89 kg/m. GEN: Well nourished, well developed, in no acute distress  HEENT: normal  Neck: no JVD, carotid bruits, or masses Cardiac: irreg irreg,  2+ RSB SEM, no rubs, or gallops, minimal B/L pitting  edema  Respiratory:  clear to auscultation bilaterally, normal work of breathing GI: soft, nontender, nondistended, + BS MS: no deformity or atrophy  Skin: warm and dry, no rash Neuro:  Strength and sensation are intact Psych: euthymic mood, full affect    Recent Labs: 05/30/2015: ALT  18; BUN 24; Creatinine, Ser 0.85; Potassium 3.9; Sodium 137; TSH 2.24    Lipid Panel Lab Results  Component Value Date   CHOL 215 (H) 01/12/2014   HDL 54.70 01/12/2014   LDLCALC 127 (H) 01/12/2014   TRIG 165.0 (H) 01/12/2014      Wt Readings from Last 3 Encounters:  01/27/16 127 lb (57.6 kg)  08/26/15 128 lb 8 oz (58.3 kg)  05/30/15 127 lb 8 oz (57.8 kg)       ASSESSMENT AND PLAN:  Chronic atrial fibrillation (HCC) - Plan: EKG 12-Lead Heart rate relatively well controlled, tolerating anticoagulation No changes to her medications  Chronic diastolic CHF (congestive heart failure) (HCC) - Plan: EKG 12-Lead Appears relatively euvolemic, minimal leg  edema on today's visit Possibly even exacerbated by felodipine no symptoms mild Currently not on a diuretic  Dizziness She reports having significant dizziness, less likely postural, More often when she looks up Suspect elated to gait instability She has had recent falls Less likely orthostasis as often happen when she is reading and she looks up quickly She is a poor historian  HYPERTENSION, BENIGN Blood pressure is well controlled on today's visit. No changes made to the medications.  Dementia, without behavioral disturbance Relatively stable, lives alone, no family involved in her care he would seem I do remain concerned about medication compliance, polypharmacy   Total encounter time more than 25 minutes  Greater than 50% was spent in counseling and coordination of care with the patient   Disposition:   F/U  6 months   Orders Placed This Encounter  Procedures  . EKG 12-Lead     Signed, Esmond Plants, M.D., Ph.D. 01/27/2016  [place], [place]

## 2016-01-27 NOTE — Patient Instructions (Signed)
Medication Instructions:   No medication changes   Please check the price of two other medications that could take the place of the pradaxa 1) Xarelto 2) Eliquis  Labwork:  No new labs  Testing/Procedures:  No New testing  Follow-Up: It was a pleasure seeing you in the office today. Please call us if you have new issues that need to be addressed before your next appt.  [phone]  Your physician wants you to follow-up in: 6 months.  You will receive a reminder letter in the mail two months in advance. If you don't receive a letter, please call our office to schedule the follow-up appointment.  If you need a refill on your cardiac medications before your next appointment, please call your pharmacy.

## 2016-01-30 ENCOUNTER — Telehealth: Payer: Self-pay | Admitting: Internal Medicine

## 2016-01-30 MED ORDER — DIGOXIN 125 MCG PO TABS: 125.0000 ug | ORAL_TABLET | Freq: Every day | ORAL | 0 refills | Status: DC

## 2016-01-30 NOTE — Telephone Encounter (Signed)
Pt needs a refill on digoxin 131mcg tablets sent to [facility] on [address].. Please advise

## 2016-03-13 ENCOUNTER — Other Ambulatory Visit: Payer: Self-pay | Admitting: Internal Medicine

## 2016-03-13 NOTE — Telephone Encounter (Signed)
Pt came in needing a refill for digoxin (LANOXIN) 0.125 MG tablet.  [facility]armacy is [id number] - [place], [place]  Call pt @ [phone]. Thank you!

## 2016-03-13 NOTE — Telephone Encounter (Signed)
Notified patient she had not yet picked up Rx sent over in August.  Verbalized understanding and she will pick it up from pharmacy today.

## 2016-04-02 ENCOUNTER — Telehealth: Payer: Self-pay | Admitting: Internal Medicine

## 2016-04-02 NOTE — Telephone Encounter (Signed)
Pt is requesting to have her lidocaine (XYLOCAINE) 2 % jelly refilled.

## 2016-04-03 MED ORDER — LIDOCAINE HCL 2 % EX GEL: 1.0000 "application " | CUTANEOUS | 0 refills | Status: DC | PRN

## 2016-04-03 NOTE — Telephone Encounter (Signed)
Medication refilled.  Patient aware. 

## 2016-04-03 NOTE — Telephone Encounter (Signed)
POK TO REFILL,  I DO NOT SEE THEM LISTED

## 2016-04-03 NOTE — Telephone Encounter (Signed)
Last OV 06/10/15 ok to fill lidocaine patch?

## 2016-04-16 ENCOUNTER — Telehealth: Payer: Self-pay | Admitting: Internal Medicine

## 2016-04-16 NOTE — Telephone Encounter (Signed)
Called pt and made appt

## 2016-04-16 NOTE — Telephone Encounter (Signed)
Pt states that she is having hip pain and has been on going for about a month.. Pt only wants to see Dr. Derrel Nip.Marland Kitchen Please advise an appt spot.. Thanks

## 2016-04-16 NOTE — Telephone Encounter (Signed)
Please schedule

## 2016-04-16 NOTE — Telephone Encounter (Signed)
11:30 Thursday?

## 2016-04-19 ENCOUNTER — Ambulatory Visit (INDEPENDENT_AMBULATORY_CARE_PROVIDER_SITE_OTHER): Payer: Medicare Other | Admitting: Internal Medicine

## 2016-04-19 ENCOUNTER — Ambulatory Visit (INDEPENDENT_AMBULATORY_CARE_PROVIDER_SITE_OTHER): Payer: Medicare Other

## 2016-04-19 VITALS — BP 168/88 | HR 68 | Temp 97.6°F | Resp 12 | Ht 67.0 in | Wt 125.5 lb

## 2016-04-19 DIAGNOSIS — G8929 Other chronic pain: Secondary | ICD-10-CM

## 2016-04-19 DIAGNOSIS — E559 Vitamin D deficiency, unspecified: Secondary | ICD-10-CM | POA: Diagnosis not present

## 2016-04-19 DIAGNOSIS — M25551 Pain in right hip: Secondary | ICD-10-CM | POA: Diagnosis not present

## 2016-04-19 DIAGNOSIS — R600 Localized edema: Secondary | ICD-10-CM

## 2016-04-19 DIAGNOSIS — I872 Venous insufficiency (chronic) (peripheral): Secondary | ICD-10-CM | POA: Diagnosis not present

## 2016-04-19 DIAGNOSIS — D692 Other nonthrombocytopenic purpura: Secondary | ICD-10-CM | POA: Diagnosis not present

## 2016-04-19 DIAGNOSIS — F0150 Vascular dementia without behavioral disturbance: Secondary | ICD-10-CM

## 2016-04-19 LAB — CBC WITH DIFFERENTIAL/PLATELET
BASOS PCT: 0.4 % (ref 0.0–3.0)
Basophils Absolute: 0 10*3/uL (ref 0.0–0.1)
EOS PCT: 0.5 % (ref 0.0–5.0)
Eosinophils Absolute: 0.1 10*3/uL (ref 0.0–0.7)
HCT: 47.2 % — ABNORMAL HIGH (ref 36.0–46.0)
HEMOGLOBIN: 15.4 g/dL — AB (ref 12.0–15.0)
LYMPHS ABS: 2.7 10*3/uL (ref 0.7–4.0)
Lymphocytes Relative: 23.4 % (ref 12.0–46.0)
MCHC: 32.7 g/dL (ref 30.0–36.0)
MCV: 88.7 fl (ref 78.0–100.0)
MONO ABS: 1 10*3/uL (ref 0.1–1.0)
Monocytes Relative: 8.6 % (ref 3.0–12.0)
NEUTROS ABS: 7.8 10*3/uL — AB (ref 1.4–7.7)
Neutrophils Relative %: 67.1 % (ref 43.0–77.0)
Platelets: 253 10*3/uL (ref 150.0–400.0)
RBC: 5.32 Mil/uL — ABNORMAL HIGH (ref 3.87–5.11)
RDW: 15.4 % (ref 11.5–15.5)
WBC: 11.7 10*3/uL — AB (ref 4.0–10.5)

## 2016-04-19 LAB — COMPREHENSIVE METABOLIC PANEL
ALT: 12 U/L (ref 0–35)
AST: 20 U/L (ref 0–37)
Albumin: 4.1 g/dL (ref 3.5–5.2)
Alkaline Phosphatase: 57 U/L (ref 39–117)
BILIRUBIN TOTAL: 0.9 mg/dL (ref 0.2–1.2)
BUN: 29 mg/dL — AB (ref 6–23)
CALCIUM: 9.5 mg/dL (ref 8.4–10.5)
CHLORIDE: 106 meq/L (ref 96–112)
CO2: 27 meq/L (ref 19–32)
CREATININE: 0.84 mg/dL (ref 0.40–1.20)
GFR: 67.48 mL/min (ref >60.00)
GLUCOSE: 86 mg/dL (ref 70–99)
Potassium: 4.2 mEq/L (ref 3.5–5.1)
SODIUM: 142 meq/L (ref 135–145)
Total Protein: 7.3 g/dL (ref 6.0–8.3)

## 2016-04-19 NOTE — Progress Notes (Signed)
Subjective:  Patient ID: Donna Rosario, female    DOB: 04/23/1923  Age: 82 y.o. MRN: [id number]  CC: The primary encounter diagnosis was Hip pain, chronic, right. Diagnoses of Purpura ([place]), Vitamin D deficiency, Venous insufficiency of both lower extremities, Localized edema, Acute right hip pain, and Vascular dementia without behavioral disturbance were also pertinent to this visit.  HPI Donna Rosario presents for follow up on multiple chroinc issues including hypothyroid, hypertension, and vascular dementia without behaviroal disturbances.    Her cc: right hip pain and SI joint pain  Occurring Daily.  Using some OTC remedy . She is not sure if if the medication is tylenol.  She states that she is taking Pradaxa,  But the Refill history confirms no refills in 2 years  No recent falls   concerned about the quality of her skin on her arms and legs.  She has several Purpura .  "I worry about my looks"  But "I miss the love of my life "  Mediates every morning,  Goes to church,  Still exercising 5 days per week.  Hips feel better with exercise  Still painting watercolors and oil. (retired Metallurgist)    Outpatient Medications Prior to Visit  Medication Sig Dispense Refill  . alendronate (FOSAMAX) 70 MG tablet TAKE 1 TABLET ONCE WEEKLY--TAKE WITH FULL GLASS OF WATER ON EMPTY STOMACH 12 tablet 0  . ARMOUR THYROID 60 MG tablet TAKE 1 TABLET (60 MG TOTAL) BY MOUTH DAILY. 90 tablet 3  . Ascorbic Acid (VITAMIN C) 500 MG tablet Take 500 mg by mouth daily.      . Calcium Carbonate-Vit D-Min (CALCIUM 1200 PO) Take 1 capsule by mouth daily.      . felodipine (PLENDIL) 2.5 MG 24 hr tablet TAKE 1 TABLET BY MOUTH EVERY DAY 90 tablet 3  . felodipine (PLENDIL) 2.5 MG 24 hr tablet TAKE 1 TABLET BY MOUTH EVERY DAY 90 tablet 3  . fish oil-omega-3 fatty acids 1000 MG capsule Take 1 g by mouth daily.      Marland Kitchen L-Lysine 500 MG TABS Take 1 tablet by mouth daily.      Marland Kitchen lidocaine (XYLOCAINE) 2 % jelly Apply 1  application topically as needed. 30 mL 0  . metoprolol (LOPRESSOR) 50 MG tablet Take 1 tablet (50 mg total) by mouth 2 (two) times daily. 180 tablet 3  . potassium chloride SA (KLOR-CON M20) 20 MEQ tablet Take 1 tablet (20 mEq total) by mouth daily. 90 tablet 3  . Selenium 200 MCG TABS Take 1 tablet by mouth daily.      . traZODone (DESYREL) 50 MG tablet TAKE 1.5 TABLETS (75 MG TOTAL) BY MOUTH AT BEDTIME. 135 tablet 1  . triamcinolone ointment (KENALOG) 0.5 % APPLY TOPICALLY 2 TIMES DAILY TO leg rash  UNTIL RESOLVED 30 g 1  . triamcinolone ointment (KENALOG) 0.5 % APPLY TOPICALLY 2 TIMES DAILY TO FACIAL RASH UNTIL RESOLVED 30 g 1  . dabigatran (PRADAXA) 150 MG CAPS capsule Take 1 capsule (150 mg total) by mouth 2 (two) times daily. (Patient not taking: Reported on 04/19/2016) 60 capsule 3  . digoxin (LANOXIN) 0.125 MG tablet Take 1 tablet (125 mcg total) by mouth daily. (Patient not taking: Reported on 04/19/2016) 90 tablet 0  . ergocalciferol (DRISDOL) 50000 UNITS capsule Take 1 capsule (50,000 Units total) by mouth once a week. (Patient not taking: Reported on 04/19/2016) 4 capsule 2  . oxyCODONE (OXY IR/ROXICODONE) 5 MG immediate release tablet  No facility-administered medications prior to visit.     Review of Systems;  Patient denies headache, fevers, malaise, unintentional weight loss, skin rash, eye pain, sinus congestion and sinus pain, sore throat, dysphagia,  hemoptysis , cough, dyspnea, wheezing, chest pain, palpitations, orthopnea, edema, abdominal pain, nausea, melena, diarrhea, constipation, flank pain, dysuria, hematuria, urinary  Frequency, nocturia, numbness, tingling, seizures,  Focal weakness, Loss of consciousness,  Tremor, insomnia, depression, anxiety, and suicidal ideation.      Objective:  BP (!) 168/88   Pulse 68   Temp 97.6 F (36.4 C) (Oral)   Resp 12   Ht 5\' 7"  (1.702 m)   Wt 125 lb 8 oz (56.9 kg)   SpO2 97%   BMI 19.66 kg/m   BP Readings from Last 3  Encounters:  04/19/16 (!) 168/88  01/27/16 140/80  08/26/15 (!) 144/84    Wt Readings from Last 3 Encounters:  04/19/16 125 lb 8 oz (56.9 kg)  01/27/16 127 lb (57.6 kg)  08/26/15 128 lb 8 oz (58.3 kg)    General appearance: alert, cooperative and appears stated age Ears: normal TM's and external ear canals both ears Throat: lips, mucosa, and tongue normal; teeth and gums normal Neck: no adenopathy, no carotid bruit, supple, symmetrical, trachea midline and thyroid not enlarged, symmetric, no tenderness/mass/nodules Back: symmetric, no curvature. ROM normal. No CVA tenderness. Lungs: clear to auscultation bilaterally Heart: regular rate and rhythm, S1, S2 normal, no murmur, click, rub or gallop Abdomen: soft, non-tender; bowel sounds normal; no masses,  no organomegaly Pulses: 2+ and symmetric Skin: Skin color, texture, turgor normal. No rashes or lesions Lymph nodes: Cervical, supraclavicular, and axillary nodes normal.  No results found for: HGBA1C  Lab Results  Component Value Date   CREATININE 0.85 05/30/2015   CREATININE 0.91 02/24/2015   CREATININE 0.95 06/08/2014    Lab Results  Component Value Date   WBC 10.8 06/08/2014   HGB 14.0 06/08/2014   HCT 43.1 06/08/2014   PLT 235 06/08/2014   GLUCOSE 82 05/30/2015   CHOL 215 (H) 01/12/2014   TRIG 165.0 (H) 01/12/2014   HDL 54.70 01/12/2014   LDLDIRECT 121 (H) 01/30/2013   LDLCALC 127 (H) 01/12/2014   ALT 18 05/30/2015   AST 25 05/30/2015   NA 137 05/30/2015   K 3.9 05/30/2015   CL 103 05/30/2015   CREATININE 0.85 05/30/2015   BUN 24 (H) 05/30/2015   CO2 25 05/30/2015   TSH 2.24 05/30/2015     Assessment & Plan:   Problem List Items Addressed This Visit    Edema    Asymmetric, secondary to venous insufficiency caused by prior hip surgery.  Recommended use of compression stockings, which she already has .       Dementia    Vascular.  She continues to live independently at [facility] since her husband's  passing in 2012 .   Medication supervision has been advised in the past but patient has habitually refused.       Venous insufficiency of leg    Mild venous stasis changes explained to patient again. Advised to wear compression stockings.      Acute right hip pain    Prior hip replacement , has history of osteoporosis. .  Repeat films show a new irregularity  In the bone around the prosthesis that suggests she may have a new  fracture.  CT of hip will be ordered to confirm       Relevant Orders   CT HIP  RIGHT W CONTRAST   Vitamin D deficiency   Relevant Orders   VITAMIN D 25 Hydroxy (Vit-D Deficiency, Fractures)    Other Visit Diagnoses    Hip pain, chronic, right    -  Primary   Relevant Orders   DG HIP UNILAT WITH PELVIS 2-3 VIEWS RIGHT (Completed)   Purpura (HCC)       Relevant Orders   Comprehensive metabolic panel   CBC with Differential/Platelet    A total of 25 minutes of face to face time was spent with patient more than half of which was spent in counselling about the above mentioned conditions  and coordination of care   I am having Ms. Hursey maintain her Calcium Carbonate-Vit D-Min (CALCIUM 1200 PO), fish oil-omega-3 fatty acids, L-Lysine, Selenium, vitamin C, potassium chloride SA, dabigatran, ARMOUR THYROID, oxyCODONE, felodipine, triamcinolone ointment, ergocalciferol, triamcinolone ointment, metoprolol, traZODone, felodipine, alendronate, digoxin, and lidocaine.  No orders of the defined types were placed in this encounter.   There are no discontinued medications.  Follow-up: No Follow-up on file.   Crecencio Mc, MD

## 2016-04-19 NOTE — Progress Notes (Signed)
Pre-visit discussion using our clinic review tool. No additional management support is needed unless otherwise documented below in the visit note.  

## 2016-04-19 NOTE — Patient Instructions (Addendum)
I am going to treat your hip pain as athritis. I am checkign an x ray today   You can take Tylenol but not Motrin or Aleve.  You can take up to 4 tylenol every day   Your bruising and skin  Changes are not serious.  They happen with aging

## 2016-04-21 NOTE — Assessment & Plan Note (Signed)
Asymmetric, secondary to venous insufficiency caused by prior hip surgery.  Recommended use of compression stockings, which she already has .

## 2016-04-21 NOTE — Assessment & Plan Note (Addendum)
Prior hip replacement , has history of osteoporosis. .  Repeat films show a new irregularity  In the bone around the prosthesis that suggests she may have a new  fracture.  CT of hip will be ordered to confirm

## 2016-04-21 NOTE — Assessment & Plan Note (Signed)
Vascular.  She continues to live independently at [facility] since her husband's passing in 2012 .   Medication supervision has been advised in the past but patient has habitually refused.

## 2016-04-21 NOTE — Assessment & Plan Note (Signed)
Mild venous stasis changes explained to patient again. Advised to wear compression stockings.

## 2016-04-23 LAB — VITAMIN D 25 HYDROXY (VIT D DEFICIENCY, FRACTURES): VITD: 27.15 ng/mL — ABNORMAL LOW (ref 30.00–100.00)

## 2016-04-25 ENCOUNTER — Other Ambulatory Visit: Payer: Self-pay | Admitting: Internal Medicine

## 2016-04-25 DIAGNOSIS — M25551 Pain in right hip: Secondary | ICD-10-CM

## 2016-05-03 ENCOUNTER — Ambulatory Visit
Admission: RE | Admit: 2016-05-03 | Discharge: 2016-05-03 | Disposition: A | Discharge status: Home / Self Care | Payer: Medicare Other | Source: Ambulatory Visit | Attending: Internal Medicine | Admitting: Internal Medicine

## 2016-05-03 DIAGNOSIS — M979XXA Periprosthetic fracture around unspecified internal prosthetic joint, initial encounter: Secondary | ICD-10-CM | POA: Insufficient documentation

## 2016-05-03 DIAGNOSIS — S72111A Displaced fracture of greater trochanter of right femur, initial encounter for closed fracture: Secondary | ICD-10-CM | POA: Insufficient documentation

## 2016-05-03 DIAGNOSIS — Z96641 Presence of right artificial hip joint: Secondary | ICD-10-CM | POA: Diagnosis not present

## 2016-05-03 DIAGNOSIS — M25551 Pain in right hip: Secondary | ICD-10-CM | POA: Insufficient documentation

## 2016-05-04 ENCOUNTER — Other Ambulatory Visit: Payer: Self-pay | Admitting: Internal Medicine

## 2016-05-07 ENCOUNTER — Telehealth: Payer: Self-pay | Admitting: *Deleted

## 2016-05-07 DIAGNOSIS — M25551 Pain in right hip: Secondary | ICD-10-CM

## 2016-05-07 NOTE — Telephone Encounter (Signed)
Patient aware.

## 2016-05-07 NOTE — Telephone Encounter (Signed)
Pt requested to have a call, pt has questions regarding her hip pain.  Pt contact [phone]

## 2016-05-07 NOTE — Assessment & Plan Note (Signed)
Persistent with new fracture around her prosthesis noted on CT of hip . Referring to orthopedics

## 2016-05-07 NOTE — Telephone Encounter (Signed)
Patient called concerning pain in hip has decided sh would like to see ortho for pain , patient staed when she tries to get up and walk the pain prevents her from doing her ADLs.

## 2016-05-07 NOTE — Telephone Encounter (Signed)
  Your referral is in process as requested to Orthopedics to evaluate the new fracture seen on the CT hip that you had.  Our referral coordinator will call you when the appointment has been made.  If you do not hear from [facility] in our office in a week, please call us back

## 2016-05-08 ENCOUNTER — Emergency Department
Admission: EM | Admit: 2016-05-08 | Discharge: 2016-05-08 | Disposition: A | Discharge status: Home / Self Care | Payer: Medicare Other | Attending: Emergency Medicine | Admitting: Emergency Medicine

## 2016-05-08 ENCOUNTER — Emergency Department: Payer: Medicare Other

## 2016-05-08 ENCOUNTER — Telehealth: Payer: Self-pay | Admitting: Internal Medicine

## 2016-05-08 DIAGNOSIS — M25551 Pain in right hip: Secondary | ICD-10-CM | POA: Diagnosis not present

## 2016-05-08 DIAGNOSIS — I5032 Chronic diastolic (congestive) heart failure: Secondary | ICD-10-CM | POA: Diagnosis not present

## 2016-05-08 DIAGNOSIS — M25559 Pain in unspecified hip: Secondary | ICD-10-CM

## 2016-05-08 DIAGNOSIS — E039 Hypothyroidism, unspecified: Secondary | ICD-10-CM | POA: Insufficient documentation

## 2016-05-08 DIAGNOSIS — Z96641 Presence of right artificial hip joint: Secondary | ICD-10-CM | POA: Insufficient documentation

## 2016-05-08 DIAGNOSIS — I110 Hypertensive heart disease with heart failure: Secondary | ICD-10-CM | POA: Diagnosis not present

## 2016-05-08 NOTE — ED Notes (Signed)
Pt ambulatory to toilet thinking she had to urinate. Pt passed gas. Stated pain with movement.

## 2016-05-08 NOTE — ED Notes (Signed)
Called pt's daughter Meredith Mody to inform her that x-ray was negative and that pt will be going back to [facility], not being admitted. Gwen started yelling at this RN about how "her mother is in chronic pain" and "i've been in the ED before and they deal with chronic pain." Gwen asked about an MRI, informed her again that x-ray was negative. Gwen vocalized negatively about how her mother has been x-rayed twice with negative results. Gwen informed that pt needs to follow up with orthopedic doctor on DC papers. Gwen stated that he performed the pt's hip surgery about 5 years ago. Gwen was informed that pt is not in pain. She stated "but if she was screaming out you would help her right?" Informed once again that pt is not in any pain at this point and that pt needs to follow up with ortho. Gwen stated she was not happy.

## 2016-05-08 NOTE — ED Notes (Signed)
[address] [facility] is where pt lives per daughter.

## 2016-05-08 NOTE — Telephone Encounter (Signed)
Pt called and stated that she is still having hip pain and wanted to know if there is anything that you can do for it. Also would like a little detail on the xrays that she had done.  She would like a call today, if possible.  Call pt @ [phone]

## 2016-05-08 NOTE — Telephone Encounter (Signed)
Patient has been called X 3 for this same reason patient was advised of results and that MD is referring patient to Ortho

## 2016-05-08 NOTE — Discharge Instructions (Signed)
You have a healing fracture of your right hip. Bear weight as tolerated. You can use a wheelchair or laying in bed for now. Please call Dr. Rudene Christians the orthopedic surgeon. He put the hip prosthesis in before. He can follow you up. If you call the office and tell him you have a fracture around the hip he will see you in the office. As I said the fracture is healing so there should be very little to do at present. For the pain I would add Tylenol 650 mg 4 times a day to the Roxicodone you are already taking. The x-rays of the left hip show no new changes.

## 2016-05-08 NOTE — ED Notes (Signed)
Pt moved up in bed by pushing with her feet and this RN pulling sheet up. Pillow adjusted behind pt's head. Pt verbalized comfort.

## 2016-05-08 NOTE — ED Provider Notes (Signed)
[facility] Emergency Department Provider Note   ____________________________________________   First MD Initiated Contact with Patient 05/08/16 2052     (approximate)  I have reviewed the triage vital signs and the nursing notes.   HISTORY  Chief Complaint Hip Pain    HPI Donna Rosario is a 82 y.o. female who reports about 3 weeks of pain in hips. Worse hip as the right side. Left hip is also hurting a little bit. Patient denies any injury. Says she had x-rays last week.   Past Medical History:  Diagnosis Date  . Arthritis   . Atrial fibrillation [facility]) Sept. 2011   [facility] Admission for RVR  . Chronic low back pain    due to lumbosacral degenerative joint disease  . Disturbance of skin sensation   . Hyperlipidemia   . Hypertension   . Mitral valve disorders(424.0)   . Osteoporosis   . Sciatica   . Thyroid disease    Hypothyroidism  . Vascular dementia, uncomplicated     Patient Active Problem List   Diagnosis Date Noted  . Erroneous encounter - disregard 07/25/2015  . Vitamin D deficiency 06/01/2015  . Hypothyroidism 05/30/2015  . Acute right hip pain 06/10/2014  . Basal cell carcinoma of temple region 12/15/2013  . Chronic diastolic CHF (congestive heart failure) ([place]) 08/10/2013  . Chest pain 02/11/2013  . Pityriasis rosea-like drug eruption 09/26/2012  . Allergic urticaria 09/23/2012  . Dizziness 04/09/2012  . Insomnia 10/31/2011  . Venous insufficiency of leg 03/11/2011  . Dementia 03/09/2011  . DIASTOLIC DYSFUNCTION [id number]  . HYPERTENSION, BENIGN 03/30/2010  . ATRIAL FIBRILLATION 03/30/2010  . Edema 03/30/2010    Past Surgical History:  Procedure Laterality Date  . JOINT REPLACEMENT     Right Hip Oct 2011,  Left Knee 2008 ([place])  . KNEE SURGERY     Left knee  . THYROIDECTOMY    . TOTAL HIP ARTHROPLASTY  2011    Prior to Admission medications   Medication Sig Start Date End Date Taking? Authorizing Provider    alendronate (FOSAMAX) 70 MG tablet TAKE 1 TABLET ONCE WEEKLY--TAKE WITH FULL GLASS OF WATER ON EMPTY STOMACH 05/04/16   Crecencio Mc, MD  ARMOUR THYROID 60 MG tablet TAKE 1 TABLET (60 MG TOTAL) BY MOUTH DAILY. 03/09/14   Crecencio Mc, MD  Ascorbic Acid (VITAMIN C) 500 MG tablet Take 500 mg by mouth daily.      Historical Provider, MD  Calcium Carbonate-Vit D-Min (CALCIUM 1200 PO) Take 1 capsule by mouth daily.      Historical Provider, MD  dabigatran (PRADAXA) 150 MG CAPS capsule Take 1 capsule (150 mg total) by mouth 2 (two) times daily. Patient not taking: Reported on 04/19/2016 10/12/13   Crecencio Mc, MD  digoxin (LANOXIN) 0.125 MG tablet Take 1 tablet (125 mcg total) by mouth daily. Patient not taking: Reported on 04/19/2016 01/30/16   Crecencio Mc, MD  ergocalciferol (DRISDOL) 50000 UNITS capsule Take 1 capsule (50,000 Units total) by mouth once a week. Patient not taking: Reported on 04/19/2016 06/01/15   Crecencio Mc, MD  felodipine (PLENDIL) 2.5 MG 24 hr tablet TAKE 1 TABLET BY MOUTH EVERY DAY 10/28/14   Minna Merritts, MD  felodipine (PLENDIL) 2.5 MG 24 hr tablet TAKE 1 TABLET BY MOUTH EVERY DAY 11/28/15   Minna Merritts, MD  fish oil-omega-3 fatty acids 1000 MG capsule Take 1 g by mouth daily.      Historical Provider, MD  L-Lysine 500 MG TABS Take 1 tablet by mouth daily.      Historical Provider, MD  lidocaine (XYLOCAINE) 2 % jelly Apply 1 application topically as needed. 04/03/16   Crecencio Mc, MD  metoprolol (LOPRESSOR) 50 MG tablet Take 1 tablet (50 mg total) by mouth 2 (two) times daily. 06/28/15   Crecencio Mc, MD  oxyCODONE (OXY IR/ROXICODONE) 5 MG immediate release tablet  06/09/14   Historical Provider, MD  potassium chloride SA (KLOR-CON M20) 20 MEQ tablet Take 1 tablet (20 mEq total) by mouth daily. 09/30/12   Minna Merritts, MD  Selenium 200 MCG TABS Take 1 tablet by mouth daily.      Historical Provider, MD  traZODone (DESYREL) 50 MG tablet TAKE 1.5 TABLETS (75  MG TOTAL) BY MOUTH AT BEDTIME. 11/23/15   Crecencio Mc, MD  triamcinolone ointment (KENALOG) 0.5 % APPLY TOPICALLY 2 TIMES DAILY TO leg rash  UNTIL RESOLVED 05/30/15   Crecencio Mc, MD  triamcinolone ointment (KENALOG) 0.5 % APPLY TOPICALLY 2 TIMES DAILY TO FACIAL RASH UNTIL RESOLVED 06/24/15   Crecencio Mc, MD    Allergies Sulfonamide derivatives  Family History  Problem Relation Age of Onset  . Other Mother     CHF  . Osteoporosis Mother   . Diabetes Mother   . Other Father     unknown  . Osteoporosis Father   . Heart disease Father     Social History Social History  Substance Use Topics  . Smoking status: Never Smoker  . Smokeless tobacco: Never Used  . Alcohol use 3.0 oz/week    5 Glasses of wine per week     Comment: occasional    Review of Systems Constitutional: No fever/chills Eyes: No visual changes. ENT: No sore throat. Cardiovascular: Denies chest pain. Respiratory: Denies shortness of breath. Gastrointestinal: No abdominal pain.  No nausea, no vomiting.  No diarrhea.  No constipation. Genitourinary: Negative for dysuria. Musculoskeletal: Negative for back pain. Skin: Negative for rash. Neurological: Negative for headaches, focal weakness or numbness.  10-point ROS otherwise negative.  ____________________________________________   PHYSICAL EXAM:  VITAL SIGNS: ED Triage Vitals [05/08/16 2107]  Enc Vitals Group     BP (!) 167/63     Pulse Rate 68     Resp 18     Temp 98 F (36.7 C)     Temp Source Oral     SpO2 96 %     Weight 141 lb 1.5 oz (64 kg)     Height 5\' 3"  (1.6 m)     Head Circumference      Peak Flow      Pain Score 0     Pain Loc      Pain Edu?      Excl. in [place]?     Constitutional: Alert and oriented. Well appearing and in no acute distress. Eyes: Conjunctivae are normal. PERRL. EOMI. Head: Atraumatic. Nose: No congestion/rhinnorhea. Mouth/Throat: Mucous membranes are moist.  Oropharynx non-erythematous. Neck: No  stridor.  Cardiovascular: Normal rate, regular rhythm.  Good peripheral circulation. Respiratory: Normal respiratory effort.  No retractions.  Gastrointestinal: Soft and nontender. No distention. No abdominal bruits. No CVA tenderness. Musculoskeletal: No lower extremity tenderness nor edema. No hip pain on palpation no hip pain on lateral compression no hip pain on keeping the leg straight pushing a very hard on both feet. No joint effusions. Good pulses and capillary refill bilaterally. Neurologic:  Normal speech and language. No gross focal  neurologic deficits are appreciated. No gait instability. Skin:  Skin is warm, dry and intact. Patient has some venous stasis changes in both feet.   ____________________________________________   LABS (all labs ordered are listed, but only abnormal results are displayed)  Labs Reviewed - No data to display ____________________________________________  EKG   ____________________________________________  RADIOLOGY  Study Result   CLINICAL DATA:  Nontraumatic bilateral hip pain for 3 weeks.  EXAM: DG HIP (WITH OR WITHOUT PELVIS) 2-3V LEFT  COMPARISON:  04/19/2016, 05/03/2016.  FINDINGS: Negative for acute fracture or dislocation about the left hip. Fragmented appearance of the greater trochanter is unchanged and appears chronic. Subacute proximal right femoral periprosthetic fracture is not well seen on this study; better seen on the recent CT. Pubic rami appear intact. Pubic symphysis and sacroiliac joints appear intact. No bone lesion or bony destruction.  IMPRESSION: Negative for acute fracture.   Electronically Signed   By: Andreas Newport M.D.   On: 05/08/2016 21:50    Study Result   CLINICAL DATA:  Right hip pain for 1 month.  EXAM: CT OF THE RIGHT HIP WITHOUT CONTRAST  TECHNIQUE: Multidetector CT imaging of the right hip was performed according to the standard protocol. Multiplanar CT image  reconstructions were also generated.  COMPARISON:  None.  FINDINGS: Bones/Joint/Cartilage  Right total hip arthroplasty without failure complication. No osteolysis or periarticular fluid collection. Beam hardening artifact resulting from the arthroplasty partially obscures adjacent soft tissue and osseous structures. Proximal periprosthetic femoral fracture extending from the greater trochanter to the proximal femoral diaphysis with periosteal new bone formation with mild persistence of the fracture cleft suggesting a subacute injury.  No acute fracture or dislocation. No lytic or sclerotic osseous lesion. Mild osteoarthritis of the right sacroiliac joint.  Ligaments  Suboptimally assessed by CT.  Muscles and Tendons  No muscle atrophy.  No intramuscular fluid collection or hematoma.  Soft tissues  No fluid collection or hematoma. Peripheral vascular atherosclerotic disease. No inguinal lymphadenopathy. No inguinal hernia.  IMPRESSION: 1. Right total hip arthroplasty without failure or complication. 2. Proximal periprosthetic femoral fracture extending from the greater trochanter to the proximal femoral diaphysis with periosteal new bone formation with mild persistence of the fracture cleft suggesting a subacute injury.   Electronically Signed   By: Kathreen Devoid   On: 05/03/2016 14:28     ____________________________________________   PROCEDURES  Procedure(s) performed:  Procedures  Critical Care performed:   ____________________________________________   INITIAL IMPRESSION / ASSESSMENT AND PLAN / ED COURSE  Pertinent labs & imaging results that were available during my care of the patient were reviewed by me and considered in my medical decision making (see chart for details).  ]  Clinical Course      ____________________________________________   FINAL CLINICAL IMPRESSION(S) / ED DIAGNOSES  Final diagnoses:  Arthralgia of  hip, unspecified laterality      NEW MEDICATIONS STARTED DURING THIS VISIT:  New Prescriptions   No medications on file     Note:  This document was prepared using Dragon voice recognition software and may include unintentional dictation errors.    Nena Polio, MD 05/08/16 [phone]

## 2016-05-08 NOTE — ED Notes (Signed)
Daughter, Meredith Mody, called checking on daughter stating pt is from [place]. States pt has been in and out of chronic pain for several days. States pt has dementia. Daughter lives out of [place] and is the one that called 26.   [phone] cell Meredith Mody, Daughter

## 2016-05-08 NOTE — ED Triage Notes (Signed)
Pt presents via [facility] from home for bilat hip pain. Per EMS pt has had hip pain x 3 weeks, she denies falling. States 7 years ago had hip surgery on both hips. Denies arthritis in hips. Denies pain upon arrival. Pt states she had both hips x-rayed earlier this week by Dr. Derrel Nip. Pt is alert and oriented, speaking in complete sentences, sitting up on stretcher.

## 2016-05-08 NOTE — ED Notes (Signed)
Pt daughter Meredith Mody called back for update. Phone given to pt to talk to daughter.

## 2016-05-28 ENCOUNTER — Ambulatory Visit: Payer: Medicare Other | Admitting: Internal Medicine

## 2016-05-28 ENCOUNTER — Other Ambulatory Visit: Payer: Self-pay | Admitting: Internal Medicine

## 2016-05-29 NOTE — Telephone Encounter (Signed)
Okay to refill? Last filled on 01/30/16 for #90 & no refills. Last OV: 04/19/16. No future appt scheduled.

## 2016-06-04 ENCOUNTER — Encounter: Payer: Self-pay | Admitting: Internal Medicine

## 2016-06-04 ENCOUNTER — Ambulatory Visit (INDEPENDENT_AMBULATORY_CARE_PROVIDER_SITE_OTHER): Payer: Medicare Other | Admitting: Internal Medicine

## 2016-06-04 DIAGNOSIS — B028 Zoster with other complications: Secondary | ICD-10-CM | POA: Diagnosis not present

## 2016-06-04 MED ORDER — BENZOCAINE 10 % MT GEL: OROMUCOSAL | 0 refills | Status: DC

## 2016-06-04 MED ORDER — ACYCLOVIR 400 MG PO TABS: 400.0000 mg | ORAL_TABLET | Freq: Every day | ORAL | 0 refills | Status: DC

## 2016-06-04 NOTE — Progress Notes (Signed)
Subjective:  Patient ID: Donna Rosario, female    DOB: 01/21/1923  Age: 82 y.o. MRN: [id number]  CC: The encounter diagnosis was Herpes zoster infection of oral mucosa.  HPI Donna Rosario presents for EVALUATION OF PAINFUL FEVER BLISTERS COVERING BOTH LIPS  concides with viral syndroe that started   3 days ago.  She denies headache,  Neck pain,  Fevers,  Odynophagia and abd pain.  No genital ulcers. .     Outpatient Medications Prior to Visit  Medication Sig Dispense Refill  . alendronate (FOSAMAX) 70 MG tablet TAKE 1 TABLET ONCE WEEKLY--TAKE WITH FULL GLASS OF WATER ON EMPTY STOMACH 12 tablet 0  . ARMOUR THYROID 60 MG tablet TAKE 1 TABLET (60 MG TOTAL) BY MOUTH DAILY. 90 tablet 3  . Ascorbic Acid (VITAMIN C) 500 MG tablet Take 500 mg by mouth daily.      . Calcium Carbonate-Vit D-Min (CALCIUM 1200 PO) Take 1 capsule by mouth daily.      . dabigatran (PRADAXA) 150 MG CAPS capsule Take 1 capsule (150 mg total) by mouth 2 (two) times daily. 60 capsule 3  . digoxin (LANOXIN) 0.125 MG tablet TAKE 1 TABLET EVERY DAY 90 tablet 0  . ergocalciferol (DRISDOL) 50000 UNITS capsule Take 1 capsule (50,000 Units total) by mouth once a week. 4 capsule 2  . felodipine (PLENDIL) 2.5 MG 24 hr tablet TAKE 1 TABLET BY MOUTH EVERY DAY 90 tablet 3  . felodipine (PLENDIL) 2.5 MG 24 hr tablet TAKE 1 TABLET BY MOUTH EVERY DAY 90 tablet 3  . fish oil-omega-3 fatty acids 1000 MG capsule Take 1 g by mouth daily.      Marland Kitchen L-Lysine 500 MG TABS Take 1 tablet by mouth daily.      Marland Kitchen lidocaine (XYLOCAINE) 2 % jelly Apply 1 application topically as needed. 30 mL 0  . metoprolol (LOPRESSOR) 50 MG tablet Take 1 tablet (50 mg total) by mouth 2 (two) times daily. 180 tablet 3  . oxyCODONE (OXY IR/ROXICODONE) 5 MG immediate release tablet     . potassium chloride SA (KLOR-CON M20) 20 MEQ tablet Take 1 tablet (20 mEq total) by mouth daily. 90 tablet 3  . Selenium 200 MCG TABS Take 1 tablet by mouth daily.      . traZODone (DESYREL)  50 MG tablet TAKE 1.5 TABLETS (75 MG TOTAL) BY MOUTH AT BEDTIME. 135 tablet 1  . triamcinolone ointment (KENALOG) 0.5 % APPLY TOPICALLY 2 TIMES DAILY TO leg rash  UNTIL RESOLVED 30 g 1  . triamcinolone ointment (KENALOG) 0.5 % APPLY TOPICALLY 2 TIMES DAILY TO FACIAL RASH UNTIL RESOLVED 30 g 1   No facility-administered medications prior to visit.     Review of Systems;  Patient denies headache, fevers, malaise, unintentional weight loss, skin rash, eye pain, sinus congestion and sinus pain, sore throat, dysphagia,  hemoptysis , cough, dyspnea, wheezing, chest pain, palpitations, orthopnea, edema, abdominal pain, nausea, melena, diarrhea, constipation, flank pain, dysuria, hematuria, urinary  Frequency, nocturia, numbness, tingling, seizures,  Focal weakness, Loss of consciousness,  Tremor, insomnia, depression, anxiety, and suicidal ideation.      Objective:  BP (!) 152/90 (BP Location: Left Arm, Patient Position: Sitting, Cuff Size: Normal)   Pulse 87   Temp 97 F (36.1 C)   Resp 18   Wt 121 lb 2 oz (54.9 kg)   BMI 21.46 kg/m   BP Readings from Last 3 Encounters:  06/04/16 (!) 152/90  05/08/16 (!) 173/83  04/19/16 (!) 168/88  Wt Readings from Last 3 Encounters:  06/04/16 121 lb 2 oz (54.9 kg)  05/08/16 141 lb 1.5 oz (64 kg)  04/19/16 125 lb 8 oz (56.9 kg)    General appearance: alert, cooperative and appears stated age Ears: normal TM's and external ear canals both ears Throat: lips, mucosa, and tongue normal; teeth and gums normal Neck: no adenopathy, no carotid bruit, supple, symmetrical, trachea midline and thyroid not enlarged, symmetric, no tenderness/mass/nodules Oropharynx:  Both lips are covered in honey colored crusts secondary to sequelae of blistering lesions . Buccal mucosa and tongue not affected Heart: regular rate and rhythm, S1, S2 normal, no murmur, click, rub or gallop Skin: Skin color, texture, turgor normal. No rashes or lesions Lymph nodes: Cervical,  supraclavicular, and axillary nodes normal.  No results found for: HGBA1C  Lab Results  Component Value Date   CREATININE 0.84 04/19/2016   CREATININE 0.85 05/30/2015   CREATININE 0.91 02/24/2015    Lab Results  Component Value Date   WBC 11.7 (H) 04/19/2016   HGB 15.4 (H) 04/19/2016   HCT 47.2 (H) 04/19/2016   PLT 253.0 04/19/2016   GLUCOSE 86 04/19/2016   CHOL 215 (H) 01/12/2014   TRIG 165.0 (H) 01/12/2014   HDL 54.70 01/12/2014   LDLDIRECT 121 (H) 01/30/2013   LDLCALC 127 (H) 01/12/2014   ALT 12 04/19/2016   AST 20 04/19/2016   NA 142 04/19/2016   K 4.2 04/19/2016   CL 106 04/19/2016   CREATININE 0.84 04/19/2016   BUN 29 (H) 04/19/2016   CO2 27 04/19/2016   TSH 2.24 05/30/2015    Dg Hip Unilat W Or Wo Pelvis 2-3 Views Left  Result Date: 05/08/2016 CLINICAL DATA:  Nontraumatic bilateral hip pain for 3 weeks. EXAM: DG HIP (WITH OR WITHOUT PELVIS) 2-3V LEFT COMPARISON:  04/19/2016, 05/03/2016. FINDINGS: Negative for acute fracture or dislocation about the left hip. Fragmented appearance of the greater trochanter is unchanged and appears chronic. Subacute proximal right femoral periprosthetic fracture is not well seen on this study; better seen on the recent CT. Pubic rami appear intact. Pubic symphysis and sacroiliac joints appear intact. No bone lesion or bony destruction. IMPRESSION: Negative for acute fracture. Electronically Signed   By: Donna Rosario M.D.   On: 05/08/2016 21:50    Assessment & Plan:   Problem List Items Addressed This Visit    Herpes zoster infection of oral mucosa    Secondary to viral illness  Acyclovir and topical lidocaine prescribed      Relevant Medications   acyclovir (ZOVIRAX) 400 MG tablet      I am having Donna Rosario start on acyclovir and benzocaine. I am also having her maintain her Calcium Carbonate-Vit D-Min (CALCIUM 1200 PO), fish oil-omega-3 fatty acids, L-Lysine, Selenium, vitamin C, potassium chloride SA, dabigatran,  ARMOUR THYROID, oxyCODONE, felodipine, triamcinolone ointment, ergocalciferol, triamcinolone ointment, metoprolol, traZODone, felodipine, lidocaine, alendronate, and digoxin.  Meds ordered this encounter  Medications  . acyclovir (ZOVIRAX) 400 MG tablet    Sig: Take 1 tablet (400 mg total) by mouth 5 (five) times daily.    Dispense:  35 tablet    Refill:  0  . benzocaine (ANBESOL JR) 10 % mucosal gel    Sig: Apply to lips as needed for painful blisters    Dispense:  5.3 g    Refill:  0    There are no discontinued medications.  Follow-up: No Follow-up on file.   Crecencio Mc, MD

## 2016-06-05 DIAGNOSIS — B028 Zoster with other complications: Secondary | ICD-10-CM | POA: Insufficient documentation

## 2016-06-05 NOTE — Assessment & Plan Note (Signed)
Secondary to viral illness  Acyclovir and topical lidocaine prescribed

## 2016-06-06 ENCOUNTER — Ambulatory Visit (INDEPENDENT_AMBULATORY_CARE_PROVIDER_SITE_OTHER): Payer: Medicare Other

## 2016-06-06 VITALS — BP 132/80 | HR 68 | Temp 97.5°F | Resp 14 | Ht 62.0 in | Wt 121.1 lb

## 2016-06-06 DIAGNOSIS — Z0000 Encounter for general adult medical examination without abnormal findings: Secondary | ICD-10-CM | POA: Diagnosis not present

## 2016-06-06 NOTE — Patient Instructions (Addendum)
Donna Rosario , Thank you for taking time to come for your Medicare Wellness Visit. I appreciate your ongoing commitment to your health goals. Please review the following plan we discussed and let me know if I can assist you in the future.   These are the goals we discussed: Goals    . Increase physical activity          Walk more and use the cane when walking       This is a list of the screening recommended for you and due dates:  Health Maintenance  Topic Date Due  . Shingles Vaccine  06/24/2017*  . Tetanus Vaccine  02/23/2022  . Flu Shot  Completed  . DEXA scan (bone density measurement)  Completed  . Pneumonia vaccines  Completed  *Topic was postponed. The date shown is not the original due date.    Fall Prevention in the Home Introduction Falls can cause injuries. They can happen to people of all ages. There are many things you can do to make your home safe and to help prevent falls. What can I do on the outside of my home?  Regularly fix the edges of walkways and driveways and fix any cracks.  Remove anything that might make you trip as you walk through a door, such as a raised step or threshold.  Trim any bushes or trees on the path to your home.  Use bright outdoor lighting.  Clear any walking paths of anything that might make someone trip, such as rocks or tools.  Regularly check to see if handrails are loose or broken. Make sure that both sides of any steps have handrails.  Any raised decks and porches should have guardrails on the edges.  Have any leaves, snow, or ice cleared regularly.  Use sand or salt on walking paths during winter.  Clean up any spills in your garage right away. This includes oil or grease spills. What can I do in the bathroom?  Use night lights.  Install grab bars by the toilet and in the tub and shower. Do not use towel bars as grab bars.  Use non-skid mats or decals in the tub or shower.  If you need to sit down in the shower,  use a plastic, non-slip stool.  Keep the floor dry. Clean up any water that spills on the floor as soon as it happens.  Remove soap buildup in the tub or shower regularly.  Attach bath mats securely with double-sided non-slip rug tape.  Do not have throw rugs and other things on the floor that can make you trip. What can I do in the bedroom?  Use night lights.  Make sure that you have a light by your bed that is easy to reach.  Do not use any sheets or blankets that are too big for your bed. They should not hang down onto the floor.  Have a firm chair that has side arms. You can use this for support while you get dressed.  Do not have throw rugs and other things on the floor that can make you trip. What can I do in the kitchen?  Clean up any spills right away.  Avoid walking on wet floors.  Keep items that you use a lot in easy-to-reach places.  If you need to reach something above you, use a strong step stool that has a grab bar.  Keep electrical cords out of the way.  Do not use floor polish or wax that  makes floors slippery. If you must use wax, use non-skid floor wax.  Do not have throw rugs and other things on the floor that can make you trip. What can I do with my stairs?  Do not leave any items on the stairs.  Make sure that there are handrails on both sides of the stairs and use them. Fix handrails that are broken or loose. Make sure that handrails are as long as the stairways.  Check any carpeting to make sure that it is firmly attached to the stairs. Fix any carpet that is loose or worn.  Avoid having throw rugs at the top or bottom of the stairs. If you do have throw rugs, attach them to the floor with carpet tape.  Make sure that you have a light switch at the top of the stairs and the bottom of the stairs. If you do not have them, ask someone to add them for you. What else can I do to help prevent falls?  Wear shoes that:  Do not have high heels.  Have  rubber bottoms.  Are comfortable and fit you well.  Are closed at the toe. Do not wear sandals.  If you use a stepladder:  Make sure that it is fully opened. Do not climb a closed stepladder.  Make sure that both sides of the stepladder are locked into place.  Ask someone to hold it for you, if possible.  Clearly mark and make sure that you can see:  Any grab bars or handrails.  First and last steps.  Where the edge of each step is.  Use tools that help you move around (mobility aids) if they are needed. These include:  Canes.  Walkers.  Scooters.  Crutches.  Turn on the lights when you go into a dark area. Replace any light bulbs as soon as they burn out.  Set up your furniture so you have a clear path. Avoid moving your furniture around.  If any of your floors are uneven, fix them.  If there are any pets around you, be aware of where they are.  Review your medicines with your doctor. Some medicines can make you feel dizzy. This can increase your chance of falling. Ask your doctor what other things that you can do to help prevent falls. This information is not intended to replace advice given to you by your health care provider. Make sure you discuss any questions you have with your health care provider. Document Released: 04/07/2009 Document Revised: 11/17/2015 Document Reviewed: 07/16/2014  2017 [employer]

## 2016-06-06 NOTE — Progress Notes (Signed)
Subjective:   Donna Rosario is a 82 y.o. female who presents for Medicare Annual (Subsequent) preventive examination.  Review of Systems:  No ROS.  Medicare Wellness Visit.  Cardiac Risk Factors include: advanced age (>51men, >49 women);hypertension     Objective:     Vitals: BP 132/80 (BP Location: Right Arm, Patient Position: Sitting, Cuff Size: Normal)   Pulse 68   Temp 97.5 F (36.4 C) (Oral)   Resp 14   Ht 5\' 2"  (1.575 m)   Wt 121 lb 1.9 oz (54.9 kg)   SpO2 95%   BMI 22.15 kg/m   Body mass index is 22.15 kg/m.   Tobacco History  Smoking Status  . Never Smoker  Smokeless Tobacco  . Never Used     Counseling given: Not Answered   Past Medical History:  Diagnosis Date  . Arthritis   . Atrial fibrillation [facility]) Sept. 2011   [facility] Admission for RVR  . Chronic low back pain    due to lumbosacral degenerative joint disease  . Disturbance of skin sensation   . Hyperlipidemia   . Hypertension   . Mitral valve disorders(424.0)   . Osteoporosis   . Sciatica   . Thyroid disease    Hypothyroidism  . Vascular dementia, uncomplicated    Past Surgical History:  Procedure Laterality Date  . JOINT REPLACEMENT     Right Hip Oct 2011,  Left Knee 2008 ([facility])  . KNEE SURGERY     Left knee  . THYROIDECTOMY    . TOTAL HIP ARTHROPLASTY  2011   Family History  Problem Relation Age of Onset  . Other Mother     CHF  . Osteoporosis Mother   . Diabetes Mother   . Other Father     unknown  . Osteoporosis Father   . Heart disease Father    History  Sexual Activity  . Sexual activity: No    Outpatient Encounter Prescriptions as of 06/06/2016  Medication Sig  . acyclovir (ZOVIRAX) 400 MG tablet Take 1 tablet (400 mg total) by mouth 5 (five) times daily.  Marland Kitchen alendronate (FOSAMAX) 70 MG tablet TAKE 1 TABLET ONCE WEEKLY--TAKE WITH FULL GLASS OF WATER ON EMPTY STOMACH  . ARMOUR THYROID 60 MG tablet TAKE 1 TABLET (60 MG TOTAL) BY MOUTH DAILY.  Marland Kitchen Ascorbic Acid  (VITAMIN C) 500 MG tablet Take 500 mg by mouth daily.    . benzocaine (ANBESOL JR) 10 % mucosal gel Apply to lips as needed for painful blisters  . Calcium Carbonate-Vit D-Min (CALCIUM 1200 PO) Take 1 capsule by mouth daily.    . dabigatran (PRADAXA) 150 MG CAPS capsule Take 1 capsule (150 mg total) by mouth 2 (two) times daily.  . digoxin (LANOXIN) 0.125 MG tablet TAKE 1 TABLET EVERY DAY  . ergocalciferol (DRISDOL) 50000 UNITS capsule Take 1 capsule (50,000 Units total) by mouth once a week.  . felodipine (PLENDIL) 2.5 MG 24 hr tablet TAKE 1 TABLET BY MOUTH EVERY DAY  . felodipine (PLENDIL) 2.5 MG 24 hr tablet TAKE 1 TABLET BY MOUTH EVERY DAY  . fish oil-omega-3 fatty acids 1000 MG capsule Take 1 g by mouth daily.    Marland Kitchen Rosario-Lysine 500 MG TABS Take 1 tablet by mouth daily.    Marland Kitchen lidocaine (XYLOCAINE) 2 % jelly Apply 1 application topically as needed.  . metoprolol (LOPRESSOR) 50 MG tablet Take 1 tablet (50 mg total) by mouth 2 (two) times daily.  Marland Kitchen oxyCODONE (OXY IR/ROXICODONE) 5 MG immediate release  tablet   . potassium chloride SA (KLOR-CON M20) 20 MEQ tablet Take 1 tablet (20 mEq total) by mouth daily.  . Selenium 200 MCG TABS Take 1 tablet by mouth daily.    . traZODone (DESYREL) 50 MG tablet TAKE 1.5 TABLETS (75 MG TOTAL) BY MOUTH AT BEDTIME.  Marland Kitchen triamcinolone ointment (KENALOG) 0.5 % APPLY TOPICALLY 2 TIMES DAILY TO leg rash  UNTIL RESOLVED  . triamcinolone ointment (KENALOG) 0.5 % APPLY TOPICALLY 2 TIMES DAILY TO FACIAL RASH UNTIL RESOLVED   No facility-administered encounter medications on file as of 06/06/2016.     Activities of Daily Living In your present state of health, do you have any difficulty performing the following activities: 06/06/2016  Hearing? Y  Vision? N  Difficulty concentrating or making decisions? Y  Walking or climbing stairs? Y  Dressing or bathing? N  Doing errands, shopping? N  Preparing Food and eating ? N  Using the Toilet? N  In the past six months, have  you accidently leaked urine? N  Do you have problems with loss of bowel control? N  Managing your Medications? N  Managing your Finances? N  Housekeeping or managing your Housekeeping? Y  Some recent data might be hidden    Patient Care Team: Crecencio Mc, MD as PCP - General (Internal Medicine) Minna Merritts, MD as Consulting Physician (Cardiology)    Assessment:    This is a routine wellness examination for Donna Rosario. The goal of the wellness visit is to assist the patient how to close the gaps in care and create a preventative care plan for the patient.   Taking calcium VIT D as appropriate/Osteoporosis reviewed.  Medications reviewed; taking without issues or barriers.  Safety issues reviewed; lives alone.  Life alert and smoke detectors in the home. No firearms in the home. Wears seatbelts when driving or riding with others. No violence in the home.  No identified risk were noted; The patient was oriented x 3; appropriate in dress and manner and no objective failures at ADL's or IADL's. Cane in use when walking distances.  BMI; discussed the importance of a healthy diet, water intake and exercise. Educational material provided.  ZOSTAVAX vaccine postponed per patient request.    Health maintenance gaps; closed.  Patient Concerns: None at this time. Follow up with PCP as needed.  Exercise Activities and Dietary recommendations Current Exercise Habits: Structured exercise class, Type of exercise: calisthenics;stretching;yoga ([place] organized classes), Time (Minutes): 60, Frequency (Times/Week): 5, Weekly Exercise (Minutes/Week): 300, Intensity: Moderate  Goals    . Increase physical activity          Walk more and use the cane when walking      Fall Risk Fall Risk  06/06/2016 06/30/2012 06/30/2012  Falls in the past year? No No No   Depression Screen PHQ 2/9 Scores 06/06/2016 06/30/2012  PHQ - 2 Score 0 0     Cognitive Function     6CIT Screen  06/06/2016  What Year? 0 points  What month? 0 points  What time? 0 points  Count back from 20 2 points  Months in reverse 2 points  Repeat phrase 2 points  Total Score 6    Immunization History  Administered Date(s) Administered  . Influenza Split 04/23/2014  . Influenza-Unspecified 02/24/2012, 03/25/2013, 04/22/2015, 04/24/2016  . Pneumococcal Conjugate-13 07/15/2013  . Pneumococcal Polysaccharide-23 07/16/2003  . Td 02/24/2012   Screening Tests Health Maintenance  Topic Date Due  . ZOSTAVAX  06/24/2017 (Originally 11/11/1984)  .  TETANUS/TDAP  02/23/2022  . INFLUENZA VACCINE  Completed  . DEXA SCAN  Completed  . PNA vac Low Risk Adult  Completed      Plan:    End of life planning; Advance aging; Advanced directives discussed. Copy of current HCPOA/Living Will requested.  Medicare Attestation I have personally reviewed: The patient's medical and social history Their use of alcohol, tobacco or illicit drugs Their current medications and supplements The patient's functional ability including ADLs,fall risks, home safety risks, cognitive, and hearing and visual impairment Diet and physical activities Evidence for depression   The patient's weight, height, BMI, and visual acuity have been recorded in the chart.  I have made referrals and provided education to the patient based on review of the above and I have provided the patient with a written personalized care plan for preventive services.   During the course of the visit the patient was educated and counseled about the following appropriate screening and preventive services:   Vaccines to include Pneumoccal, Influenza, Hepatitis B, Td, Zostavax, HCV  Electrocardiogram  Cardiovascular Disease  Colorectal cancer screening  Bone density screening  Diabetes screening  Glaucoma screening  Mammography/PAP  Nutrition counseling   Patient Instructions (the written plan) was given to the patient.    OBrien-Blaney, Donna Ekman L, LPN  [id number]   I have reviewed the above information and agree with above.   Deborra Medina, MD

## 2016-06-27 ENCOUNTER — Other Ambulatory Visit: Payer: Self-pay

## 2016-06-27 ENCOUNTER — Telehealth: Payer: Self-pay | Admitting: Internal Medicine

## 2016-06-27 MED ORDER — METOPROLOL TARTRATE 50 MG PO TABS: 50.0000 mg | ORAL_TABLET | Freq: Two times a day (BID) | ORAL | 3 refills | Status: DC

## 2016-06-27 NOTE — Telephone Encounter (Signed)
Metoprolol sent to pharmacy [facility]

## 2016-06-27 NOTE — Telephone Encounter (Signed)
Pt needs a refill on metoprolol (LOPRESSOR) 50 MG tablet [facility] to [facility] Please advise

## 2016-07-26 DIAGNOSIS — I639 Cerebral infarction, unspecified: Secondary | ICD-10-CM

## 2016-07-26 HISTORY — DX: Cerebral infarction, unspecified: I63.9

## 2016-07-27 ENCOUNTER — Telehealth: Payer: Self-pay | Admitting: Internal Medicine

## 2016-07-27 NOTE — Telephone Encounter (Signed)
Pt needs a refill on alendronate (FOSAMAX) 70 MG tablet sent to [facility] [address] Please advise

## 2016-07-27 NOTE — Telephone Encounter (Signed)
Last DEXA 04/26/07 Please advise

## 2016-07-28 MED ORDER — ALENDRONATE SODIUM 70 MG PO TABS: 70.0000 mg | ORAL_TABLET | ORAL | 0 refills | Status: AC

## 2016-07-28 NOTE — Telephone Encounter (Signed)
GIVEN AGE, NO DEXA NEEDED

## 2016-07-31 ENCOUNTER — Telehealth: Payer: Self-pay | Admitting: Internal Medicine

## 2016-07-31 NOTE — Telephone Encounter (Signed)
Pt daughter dropped off health and wellness forms to be filled out.. Pt daughter would like these to be mailed back to her there is an envelope in folder.. Placed in Dr. Demetrios Isaacs colored  folder up front

## 2016-08-01 NOTE — Telephone Encounter (Signed)
Have you seen these forms yet? I can not find them.

## 2016-08-01 NOTE — Telephone Encounter (Signed)
HAVE NOT SEEN.  IF PLACED IN GREEN FOLDER WIL NOT BE SEEN FOR A WEEK  SO WHAT COLOR FOLDER DID YOU PUT IT IN

## 2016-08-03 ENCOUNTER — Encounter: Payer: Self-pay | Admitting: Emergency Medicine

## 2016-08-03 ENCOUNTER — Emergency Department
Admission: EM | Admit: 2016-08-03 | Discharge: 2016-08-03 | Disposition: A | Discharge status: Home / Self Care | Payer: Medicare Other | Source: Home / Self Care | Attending: Emergency Medicine | Admitting: Emergency Medicine

## 2016-08-03 ENCOUNTER — Emergency Department: Payer: Medicare Other

## 2016-08-03 ENCOUNTER — Telehealth: Payer: Self-pay | Admitting: Internal Medicine

## 2016-08-03 DIAGNOSIS — Z79899 Other long term (current) drug therapy: Secondary | ICD-10-CM | POA: Insufficient documentation

## 2016-08-03 DIAGNOSIS — S0181XA Laceration without foreign body of other part of head, initial encounter: Secondary | ICD-10-CM

## 2016-08-03 DIAGNOSIS — Z7689 Persons encountering health services in other specified circumstances: Secondary | ICD-10-CM

## 2016-08-03 DIAGNOSIS — E039 Hypothyroidism, unspecified: Secondary | ICD-10-CM

## 2016-08-03 DIAGNOSIS — Y999 Unspecified external cause status: Secondary | ICD-10-CM | POA: Insufficient documentation

## 2016-08-03 DIAGNOSIS — W1839XA Other fall on same level, initial encounter: Secondary | ICD-10-CM | POA: Insufficient documentation

## 2016-08-03 DIAGNOSIS — I509 Heart failure, unspecified: Secondary | ICD-10-CM | POA: Insufficient documentation

## 2016-08-03 DIAGNOSIS — I110 Hypertensive heart disease with heart failure: Secondary | ICD-10-CM

## 2016-08-03 DIAGNOSIS — Y929 Unspecified place or not applicable: Secondary | ICD-10-CM | POA: Insufficient documentation

## 2016-08-03 DIAGNOSIS — Y9301 Activity, walking, marching and hiking: Secondary | ICD-10-CM

## 2016-08-03 LAB — CBC
HCT: 46.4 % (ref 35.0–47.0)
Hemoglobin: 15 g/dL (ref 12.0–16.0)
MCH: 28.6 pg (ref 26.0–34.0)
MCHC: 32.4 g/dL (ref 32.0–36.0)
MCV: 88.3 fL (ref 80.0–100.0)
PLATELETS: 247 10*3/uL (ref 150–440)
RBC: 5.26 MIL/uL — ABNORMAL HIGH (ref 3.80–5.20)
RDW: 15.1 % — ABNORMAL HIGH (ref 11.5–14.5)
WBC: 11.4 10*3/uL — ABNORMAL HIGH (ref 3.6–11.0)

## 2016-08-03 LAB — URINALYSIS, COMPLETE (UACMP) WITH MICROSCOPIC
Bacteria, UA: NONE SEEN
Bilirubin Urine: NEGATIVE
Glucose, UA: NEGATIVE mg/dL
Hgb urine dipstick: NEGATIVE
KETONES UR: NEGATIVE mg/dL
Nitrite: NEGATIVE
PROTEIN: NEGATIVE mg/dL
Specific Gravity, Urine: 1.01 (ref 1.005–1.030)
pH: 8 (ref 5.0–8.0)

## 2016-08-03 LAB — COMPREHENSIVE METABOLIC PANEL
ALBUMIN: 3.8 g/dL (ref 3.5–5.0)
ALT: 17 U/L (ref 14–54)
AST: 28 U/L (ref 15–41)
Alkaline Phosphatase: 59 U/L (ref 38–126)
Anion gap: 7 (ref 5–15)
BILIRUBIN TOTAL: 1 mg/dL (ref 0.3–1.2)
BUN: 19 mg/dL (ref 6–20)
CHLORIDE: 108 mmol/L (ref 101–111)
CO2: 24 mmol/L (ref 22–32)
CREATININE: 0.86 mg/dL (ref 0.44–1.00)
Calcium: 8.9 mg/dL (ref 8.9–10.3)
GFR calc Af Amer: >60 mL/min (ref >60)
GFR calc non Af Amer: 57 mL/min — ABNORMAL LOW (ref >60)
GLUCOSE: 107 mg/dL — AB (ref 65–99)
POTASSIUM: 3.8 mmol/L (ref 3.5–5.1)
Sodium: 139 mmol/L (ref 135–145)
TOTAL PROTEIN: 6.9 g/dL (ref 6.5–8.1)

## 2016-08-03 MED ORDER — BACITRACIN ZINC 500 UNIT/GM EX OINT
TOPICAL_OINTMENT | Freq: Two times a day (BID) | CUTANEOUS | Status: DC
  Filled 2016-08-03: qty 0.9

## 2016-08-03 MED ORDER — LIDOCAINE HCL (PF) 1 % IJ SOLN
INTRAMUSCULAR | Status: AC
  Administered 2016-08-03: 15:00:00
  Filled 2016-08-03: qty 5

## 2016-08-03 NOTE — ED Provider Notes (Signed)
[facility] Emergency Department Provider Note  ____________________________________________  Time seen: Approximately 4:53 PM  I have reviewed the triage vital signs and the nursing notes.   HISTORY  Chief Complaint Laceration    HPI Donna Rosario is a 83 y.o. female presents to the emergency department with chin laceration after falling outside when trying to deliver a Valentine's Day present to her neighbor.Patient is unable to provide an accurate history. Patient does not remember if she hit head or how she fell. Patient states that left hand has been hurting since fall. Patient also states that she has a laceration over right hand from a couple days ago but does not remember how she got laceration. Patient denies being on any blood thinners.  Patient has not taken anything for pain. Denies headache, shortness of breath, chest pain, nausea, vomiting, abdominal pain.   Past Medical History:  Diagnosis Date  . Arthritis   . Atrial fibrillation [facility]) Sept. 2011   [facility] Admission for RVR  . Chronic low back pain    due to lumbosacral degenerative joint disease  . Disturbance of skin sensation   . Hyperlipidemia   . Hypertension   . Mitral valve disorders(424.0)   . Osteoporosis   . Sciatica   . Thyroid disease    Hypothyroidism  . Vascular dementia, uncomplicated     Patient Active Problem List   Diagnosis Date Noted  . Herpes zoster infection of oral mucosa 06/05/2016  . Erroneous encounter - disregard 07/25/2015  . Vitamin D deficiency 06/01/2015  . Hypothyroidism 05/30/2015  . Acute right hip pain 06/10/2014  . Basal cell carcinoma of temple region 12/15/2013  . Chronic diastolic CHF (congestive heart failure) ([place]) 08/10/2013  . Chest pain 02/11/2013  . Pityriasis rosea-like drug eruption 09/26/2012  . Allergic urticaria 09/23/2012  . Dizziness 04/09/2012  . Insomnia 10/31/2011  . Venous insufficiency of leg 03/11/2011  . Dementia  03/09/2011  . DIASTOLIC DYSFUNCTION [id number]  . HYPERTENSION, BENIGN 03/30/2010  . ATRIAL FIBRILLATION 03/30/2010  . Edema 03/30/2010    Past Surgical History:  Procedure Laterality Date  . JOINT REPLACEMENT     Right Hip Oct 2011,  Left Knee 2008 ([facility])  . KNEE SURGERY     Left knee  . THYROIDECTOMY    . TOTAL HIP ARTHROPLASTY  2011    Prior to Admission medications   Medication Sig Start Date End Date Taking? Authorizing Provider  acyclovir (ZOVIRAX) 400 MG tablet Take 1 tablet (400 mg total) by mouth 5 (five) times daily. 06/04/16   Crecencio Mc, MD  alendronate (FOSAMAX) 70 MG tablet Take 1 tablet (70 mg total) by mouth once a week. Take with a full glass of water on an empty stomach. 07/28/16   Crecencio Mc, MD  ARMOUR THYROID 60 MG tablet TAKE 1 TABLET (60 MG TOTAL) BY MOUTH DAILY. 03/09/14   Crecencio Mc, MD  Ascorbic Acid (VITAMIN C) 500 MG tablet Take 500 mg by mouth daily.      Historical Provider, MD  benzocaine (ANBESOL JR) 10 % mucosal gel Apply to lips as needed for painful blisters 06/04/16   Crecencio Mc, MD  Calcium Carbonate-Vit D-Min (CALCIUM 1200 PO) Take 1 capsule by mouth daily.      Historical Provider, MD  dabigatran (PRADAXA) 150 MG CAPS capsule Take 1 capsule (150 mg total) by mouth 2 (two) times daily. 10/12/13   Crecencio Mc, MD  digoxin (LANOXIN) 0.125 MG tablet TAKE 1 TABLET  EVERY DAY 05/30/16   Crecencio Mc, MD  ergocalciferol (DRISDOL) 50000 UNITS capsule Take 1 capsule (50,000 Units total) by mouth once a week. 06/01/15   Crecencio Mc, MD  felodipine (PLENDIL) 2.5 MG 24 hr tablet TAKE 1 TABLET BY MOUTH EVERY DAY 10/28/14   Minna Merritts, MD  felodipine (PLENDIL) 2.5 MG 24 hr tablet TAKE 1 TABLET BY MOUTH EVERY DAY 11/28/15   Minna Merritts, MD  fish oil-omega-3 fatty acids 1000 MG capsule Take 1 g by mouth daily.      Historical Provider, MD  L-Lysine 500 MG TABS Take 1 tablet by mouth daily.      Historical Provider, MD  lidocaine  (XYLOCAINE) 2 % jelly Apply 1 application topically as needed. 04/03/16   Crecencio Mc, MD  metoprolol (LOPRESSOR) 50 MG tablet Take 1 tablet (50 mg total) by mouth 2 (two) times daily. 06/27/16   Crecencio Mc, MD  oxyCODONE (OXY IR/ROXICODONE) 5 MG immediate release tablet  06/09/14   Historical Provider, MD  potassium chloride SA (KLOR-CON M20) 20 MEQ tablet Take 1 tablet (20 mEq total) by mouth daily. 09/30/12   Minna Merritts, MD  Selenium 200 MCG TABS Take 1 tablet by mouth daily.      Historical Provider, MD  traZODone (DESYREL) 50 MG tablet TAKE 1.5 TABLETS (75 MG TOTAL) BY MOUTH AT BEDTIME. 11/23/15   Crecencio Mc, MD  triamcinolone ointment (KENALOG) 0.5 % APPLY TOPICALLY 2 TIMES DAILY TO leg rash  UNTIL RESOLVED 05/30/15   Crecencio Mc, MD  triamcinolone ointment (KENALOG) 0.5 % APPLY TOPICALLY 2 TIMES DAILY TO FACIAL RASH UNTIL RESOLVED 06/24/15   Crecencio Mc, MD    Allergies Sulfonamide derivatives  Family History  Problem Relation Age of Onset  . Other Mother     CHF  . Osteoporosis Mother   . Diabetes Mother   . Other Father     unknown  . Osteoporosis Father   . Heart disease Father     Social History Social History  Substance Use Topics  . Smoking status: Never Smoker  . Smokeless tobacco: Never Used  . Alcohol use 3.0 oz/week    5 Glasses of wine per week     Comment: occasional     Review of Systems  Constitutional: No fever/chills ENT: No upper respiratory complaints. Cardiovascular: No chest pain. Respiratory: No cough. No SOB. Gastrointestinal: No abdominal pain.  No nausea, no vomiting.  Neurological: Negative for headaches, numbness or tingling   ____________________________________________   PHYSICAL EXAM:  VITAL SIGNS: ED Triage Vitals  Enc Vitals Group     BP 08/03/16 1118 (!) 192/91     Pulse Rate 08/03/16 1118 (!) 56     Resp 08/03/16 1118 16     Temp 08/03/16 1118 97.4 F (36.3 C)     Temp Source 08/03/16 1118 Oral      SpO2 08/03/16 1118 98 %     Weight 08/03/16 1116 124 lb (56.2 kg)     Height 08/03/16 1116 5\' 3"  (1.6 m)     Head Circumference --      Peak Flow --      Pain Score 08/03/16 1116 0     Pain Loc --      Pain Edu? --      Excl. in [place]? --      Constitutional: Alert. Well appearing and in no acute distress. Eyes: Conjunctivae are normal. PERRL. EOMI. Head:  ENT:      Ears:      Nose: No congestion/rhinnorhea.      Mouth/Throat: Mucous membranes are moist.  Neck: No stridor.  No cervical spine tenderness to palpation. Cardiovascular: Normal rate, regular rhythm.  Good peripheral circulation. Respiratory: Normal respiratory effort without tachypnea or retractions. Lungs CTAB. Good air entry to the bases with no decreased or absent breath sounds. Gastrointestinal: Bowel sounds 4 quadrants. Soft and nontender to palpation. No guarding or rigidity. No palpable masses. No distention.  Musculoskeletal: Full range of motion to all extremities. No gross deformities appreciated. Neurologic:  Normal speech and language. No gross focal neurologic deficits are appreciated.  Skin:  Skin is warm, dry. No rash noted. 2 cm laceration over chin. 3 cm skin tear over right hand. 1 cm x 1 cm bruise at base of first metatarsal on left hand. Multiple bruises over her arms.    ____________________________________________   LABS (all labs ordered are listed, but only abnormal results are displayed)  Labs Reviewed  CBC - Abnormal; Notable for the following:       Result Value   WBC 11.4 (*)    RBC 5.26 (*)    RDW 15.1 (*)    All other components within normal limits  COMPREHENSIVE METABOLIC PANEL - Abnormal; Notable for the following:    Glucose, Bld 107 (*)    GFR calc non Af Amer 57 (*)    All other components within normal limits  URINALYSIS, COMPLETE (UACMP) WITH MICROSCOPIC - Abnormal; Notable for the following:    Color, Urine STRAW (*)    APPearance CLEAR (*)    Leukocytes, UA TRACE (*)     Squamous Epithelial / LPF 0-5 (*)    All other components within normal limits   ____________________________________________  EKG   ____________________________________________  RADIOLOGY Robinette Haines, personally viewed and evaluated these images (plain radiographs) as part of my medical decision making, as well as reviewing the written report by the radiologist.  Dg Chest 2 View  Result Date: 08/03/2016 CLINICAL DATA:  Fall while walking.  Initial encounter. EXAM: CHEST  2 VIEW COMPARISON:  03/02/2010 FINDINGS: Stable mild to moderate cardiomegaly.  Aortic atherosclerosis. Pulmonary hyperinflation again seen, consistent with COPD. No evidence of pulmonary infiltrate or edema. No evidence of pneumothorax or pleural effusion. Several old left upper rib fracture deformities again noted. IMPRESSION: Stable cardiomegaly and COPD.  No acute findings. Electronically Signed   By: Earle Gell M.D.   On: 08/03/2016 14:31   Ct Head Wo Contrast  Result Date: 08/03/2016 CLINICAL DATA:  Status post fall. EXAM: CT HEAD WITHOUT CONTRAST TECHNIQUE: Contiguous axial images were obtained from the base of the skull through the vertex without intravenous contrast. COMPARISON:  None. FINDINGS: Brain: No evidence of acute infarction, hemorrhage, extra-axial collection, ventriculomegaly, or mass effect. Generalized cerebral atrophy. Periventricular white matter low attenuation likely secondary to microangiopathy. Vascular: Cerebrovascular atherosclerotic calcifications are noted. Skull: Negative for fracture or focal lesion. Sinuses/Orbits: Visualized portions of the orbits are unremarkable. Visualized portions of the paranasal sinuses and mastoid air cells are unremarkable. Other: None. IMPRESSION: 1. No acute intracranial pathology. 2. Chronic microvascular disease and cerebral atrophy. Electronically Signed   By: Kathreen Devoid   On: 08/03/2016 13:25   Dg Hand Complete Left  Result Date: 08/03/2016 CLINICAL DATA:   [place] getting out of car, landing on LEFT hand. Walks with cane. EXAM: LEFT HAND - COMPLETE 3+ VIEW COMPARISON:  None. FINDINGS: No acute fracture deformity  dislocation. Severe first carpometacarpal joint space narrowing, periarticular sclerosis and marginal spurring compatible with osteoarthrosis. Mild to moderate interphalangeal joint osteoarthrosis. Osteopenia without destructive bony lesions. Soft tissue planes are nonsuspicious. IMPRESSION: No acute fracture deformity or dislocation. Please note, osteopenia decreases sensitivity for acute nondisplaced fractures. Degenerative change including severe first carpometacarpal osteoarthrosis. Electronically Signed   By: Elon Alas M.D.   On: 08/03/2016 14:38    ____________________________________________    PROCEDURES  Procedure(s) performed:    Procedures  LACERATION REPAIR Performed by: Laban Emperor  Consent: Verbal consent obtained.  Consent given by: patient  Prepped and Draped in normal sterile fashion  Wound explored: No foreign bodies   Laceration Location: chin  Laceration Length: 2 cm  Anesthesia: None  Local anesthetic: lidocaine 1% without epinephrine  Anesthetic total: 2 ml  Irrigation method: syringe  Amount of cleaning: 559ml normal saline  Skin closure: 4-0 nylon  Number of sutures: 5  Technique: Simple interrupted  Patient tolerance: Patient tolerated the procedure well with no immediate complications.  Medications  bacitracin ointment (not administered)  lidocaine (PF) (XYLOCAINE) 1 % injection (  Given 08/03/16 1521)     ____________________________________________   INITIAL IMPRESSION / ASSESSMENT AND PLAN / ED COURSE  Pertinent labs & imaging results that were available during my care of the patient were reviewed by me and considered in my medical decision making (see chart for details).  Review of the [place] CSRS was performed in accordance of the [place] prior to dispensing any  controlled drugs.     Patient's diagnosis is consistent with chin laceration after fall. Vital signs and exam are reassuring. Patient was unable to give an accurate history of event so head CT was ordered. Labs, urinalysis, chest x-ray were ordered to rule out infection or cause of fall. After speaking with nurse at independent living center, nurse states that patient has been progressively getting more confused the last month. Laceration was repaired in ED. Wound on right hand was dressed and patient is to follow-up with wound center. Patient is to follow up with PCP as directed. Patient is given ED precautions to return to the ED for any worsening or new symptoms.     ____________________________________________  FINAL CLINICAL IMPRESSION(S) / ED DIAGNOSES  Final diagnoses:  Facial laceration, initial encounter      NEW MEDICATIONS STARTED DURING THIS VISIT:  Discharge Medication List as of 08/03/2016  3:13 PM          This chart was dictated using voice recognition software/Dragon. Despite best efforts to proofread, errors can occur which can change the meaning. Any change was purely unintentional.    Laban Emperor, PA-C 08/03/16 1700    Earleen Newport, MD 08/04/16 [phone]

## 2016-08-03 NOTE — ED Triage Notes (Signed)
States got out of the car and fell while walking to friends door.  Patient states she has history of falls, walks with a cane  Laceration to chin.  Bleeding controlled.  Wounds cleansed with soap and water, bandaid applied in triage.

## 2016-08-03 NOTE — Telephone Encounter (Signed)
Completed forms mailed to pt's daughter, Marin Roberts. Copies sent to be scanned.

## 2016-08-03 NOTE — Telephone Encounter (Signed)
I have completed the sections required by MS. Please fill out the administrative sections and attach current medication list .  Red folder  .  The Charge  Is $50

## 2016-08-04 ENCOUNTER — Emergency Department: Payer: Medicare Other

## 2016-08-04 ENCOUNTER — Encounter: Payer: Self-pay | Admitting: Emergency Medicine

## 2016-08-04 ENCOUNTER — Inpatient Hospital Stay
Admission: EM | Admit: 2016-08-04 | Discharge: 2016-08-07 | DRG: 064 | Disposition: A | Discharge status: Skilled Nursing Facility | Payer: Medicare Other | Attending: Internal Medicine | Admitting: Internal Medicine

## 2016-08-04 DIAGNOSIS — W1839XA Other fall on same level, initial encounter: Secondary | ICD-10-CM | POA: Diagnosis present

## 2016-08-04 DIAGNOSIS — I639 Cerebral infarction, unspecified: Secondary | ICD-10-CM

## 2016-08-04 DIAGNOSIS — M6281 Muscle weakness (generalized): Secondary | ICD-10-CM

## 2016-08-04 DIAGNOSIS — R4701 Aphasia: Secondary | ICD-10-CM | POA: Diagnosis present

## 2016-08-04 DIAGNOSIS — G9340 Encephalopathy, unspecified: Secondary | ICD-10-CM | POA: Diagnosis present

## 2016-08-04 DIAGNOSIS — Z8249 Family history of ischemic heart disease and other diseases of the circulatory system: Secondary | ICD-10-CM | POA: Diagnosis not present

## 2016-08-04 DIAGNOSIS — Z66 Do not resuscitate: Secondary | ICD-10-CM | POA: Diagnosis present

## 2016-08-04 DIAGNOSIS — R4182 Altered mental status, unspecified: Secondary | ICD-10-CM | POA: Diagnosis present

## 2016-08-04 DIAGNOSIS — Z7901 Long term (current) use of anticoagulants: Secondary | ICD-10-CM

## 2016-08-04 DIAGNOSIS — I6340 Cerebral infarction due to embolism of unspecified cerebral artery: Principal | ICD-10-CM | POA: Diagnosis present

## 2016-08-04 DIAGNOSIS — Z79899 Other long term (current) drug therapy: Secondary | ICD-10-CM

## 2016-08-04 DIAGNOSIS — I482 Chronic atrial fibrillation: Secondary | ICD-10-CM | POA: Diagnosis present

## 2016-08-04 DIAGNOSIS — Z882 Allergy status to sulfonamides status: Secondary | ICD-10-CM | POA: Diagnosis not present

## 2016-08-04 DIAGNOSIS — S0181XA Laceration without foreign body of other part of head, initial encounter: Secondary | ICD-10-CM | POA: Diagnosis present

## 2016-08-04 DIAGNOSIS — M47817 Spondylosis without myelopathy or radiculopathy, lumbosacral region: Secondary | ICD-10-CM | POA: Diagnosis present

## 2016-08-04 DIAGNOSIS — Z96641 Presence of right artificial hip joint: Secondary | ICD-10-CM | POA: Diagnosis present

## 2016-08-04 DIAGNOSIS — I119 Hypertensive heart disease without heart failure: Secondary | ICD-10-CM | POA: Diagnosis present

## 2016-08-04 DIAGNOSIS — G9341 Metabolic encephalopathy: Secondary | ICD-10-CM | POA: Diagnosis present

## 2016-08-04 DIAGNOSIS — Z8262 Family history of osteoporosis: Secondary | ICD-10-CM | POA: Diagnosis not present

## 2016-08-04 DIAGNOSIS — M810 Age-related osteoporosis without current pathological fracture: Secondary | ICD-10-CM | POA: Diagnosis present

## 2016-08-04 DIAGNOSIS — E890 Postprocedural hypothyroidism: Secondary | ICD-10-CM | POA: Diagnosis present

## 2016-08-04 DIAGNOSIS — E785 Hyperlipidemia, unspecified: Secondary | ICD-10-CM | POA: Diagnosis present

## 2016-08-04 DIAGNOSIS — F0150 Vascular dementia without behavioral disturbance: Secondary | ICD-10-CM | POA: Diagnosis present

## 2016-08-04 DIAGNOSIS — R2681 Unsteadiness on feet: Secondary | ICD-10-CM

## 2016-08-04 LAB — CBC WITH DIFFERENTIAL/PLATELET
Basophils Absolute: 0.1 10*3/uL (ref 0–0.1)
Basophils Relative: 1 %
Eosinophils Absolute: 0 10*3/uL (ref 0–0.7)
Eosinophils Relative: 0 %
HCT: 44.9 % (ref 35.0–47.0)
Hemoglobin: 14.7 g/dL (ref 12.0–16.0)
Lymphocytes Relative: 14 %
Lymphs Abs: 1.6 10*3/uL (ref 1.0–3.6)
MCH: 29.4 pg (ref 26.0–34.0)
MCHC: 32.7 g/dL (ref 32.0–36.0)
MCV: 89.7 fL (ref 80.0–100.0)
Monocytes Absolute: 1 10*3/uL — ABNORMAL HIGH (ref 0.2–0.9)
Monocytes Relative: 9 %
Neutro Abs: 8.6 10*3/uL — ABNORMAL HIGH (ref 1.4–6.5)
Neutrophils Relative %: 76 %
Platelets: 218 10*3/uL (ref 150–440)
RBC: 5.01 MIL/uL (ref 3.80–5.20)
RDW: 15.2 % — ABNORMAL HIGH (ref 11.5–14.5)
WBC: 11.4 10*3/uL — ABNORMAL HIGH (ref 3.6–11.0)

## 2016-08-04 LAB — URINALYSIS, COMPLETE (UACMP) WITH MICROSCOPIC
BACTERIA UA: NONE SEEN
BILIRUBIN URINE: NEGATIVE
Glucose, UA: NEGATIVE mg/dL
Hgb urine dipstick: NEGATIVE
KETONES UR: 5 mg/dL — AB
LEUKOCYTES UA: NEGATIVE
Nitrite: NEGATIVE
PH: 8 (ref 5.0–8.0)
Protein, ur: NEGATIVE mg/dL
SQUAMOUS EPITHELIAL / LPF: NONE SEEN
Specific Gravity, Urine: 1.016 (ref 1.005–1.030)

## 2016-08-04 LAB — TSH: TSH: 1.385 u[IU]/mL (ref 0.350–4.500)

## 2016-08-04 LAB — COMPREHENSIVE METABOLIC PANEL
ALT: 16 U/L (ref 14–54)
AST: 32 U/L (ref 15–41)
Albumin: 3.8 g/dL (ref 3.5–5.0)
Alkaline Phosphatase: 60 U/L (ref 38–126)
Anion gap: 9 (ref 5–15)
BUN: 16 mg/dL (ref 6–20)
CO2: 23 mmol/L (ref 22–32)
Calcium: 8.6 mg/dL — ABNORMAL LOW (ref 8.9–10.3)
Chloride: 106 mmol/L (ref 101–111)
Creatinine, Ser: 0.82 mg/dL (ref 0.44–1.00)
GFR calc Af Amer: >60 mL/min (ref >60)
GFR calc non Af Amer: >60 mL/min (ref >60)
Glucose, Bld: 120 mg/dL — ABNORMAL HIGH (ref 65–99)
Potassium: 4.1 mmol/L (ref 3.5–5.1)
Sodium: 138 mmol/L (ref 135–145)
Total Bilirubin: 1.6 mg/dL — ABNORMAL HIGH (ref 0.3–1.2)
Total Protein: 6.9 g/dL (ref 6.5–8.1)

## 2016-08-04 LAB — INFLUENZA PANEL BY PCR (TYPE A & B)
INFLAPCR: NEGATIVE
Influenza B By PCR: NEGATIVE

## 2016-08-04 LAB — TROPONIN I: Troponin I: <0.03 ng/mL (ref <0.03)

## 2016-08-04 LAB — CK: Total CK: 219 U/L (ref 38–234)

## 2016-08-04 LAB — DIGOXIN LEVEL: DIGOXIN LVL: 0.8 ng/mL (ref 0.8–2.0)

## 2016-08-04 LAB — MRSA PCR SCREENING: MRSA by PCR: NEGATIVE

## 2016-08-04 LAB — LIPASE, BLOOD: Lipase: 26 U/L (ref 11–51)

## 2016-08-04 MED ORDER — ONDANSETRON HCL 4 MG/2ML IJ SOLN: 4.0000 mg | Freq: Four times a day (QID) | INTRAMUSCULAR | Status: DC | PRN

## 2016-08-04 MED ORDER — THYROID 60 MG PO TABS
60.0000 mg | ORAL_TABLET | Freq: Every day | ORAL | Status: DC
  Administered 2016-08-05 – 2016-08-07 (×3): 60 mg via ORAL
  Filled 2016-08-04 (×3): qty 1

## 2016-08-04 MED ORDER — DEXTROSE 5 % IV SOLN
2.0000 g | Freq: Two times a day (BID) | INTRAVENOUS | Status: DC
  Administered 2016-08-04 – 2016-08-05 (×3): 2 g via INTRAVENOUS
  Filled 2016-08-04 (×5): qty 2

## 2016-08-04 MED ORDER — METOPROLOL TARTRATE 50 MG PO TABS
50.0000 mg | ORAL_TABLET | Freq: Two times a day (BID) | ORAL | Status: DC
  Administered 2016-08-05 – 2016-08-07 (×5): 50 mg via ORAL
  Filled 2016-08-04 (×6): qty 1

## 2016-08-04 MED ORDER — VANCOMYCIN HCL IN DEXTROSE 750-5 MG/150ML-% IV SOLN
750.0000 mg | INTRAVENOUS | Status: DC
  Administered 2016-08-05 – 2016-08-06 (×2): 750 mg via INTRAVENOUS
  Filled 2016-08-04 (×2): qty 150

## 2016-08-04 MED ORDER — VANCOMYCIN HCL IN DEXTROSE 750-5 MG/150ML-% IV SOLN
750.0000 mg | Freq: Once | INTRAVENOUS | Status: AC
  Administered 2016-08-04: 750 mg via INTRAVENOUS
  Filled 2016-08-04: qty 150

## 2016-08-04 MED ORDER — ACYCLOVIR 200 MG PO CAPS
400.0000 mg | ORAL_CAPSULE | Freq: Every day | ORAL | Status: DC
  Administered 2016-08-05 – 2016-08-07 (×9): 400 mg via ORAL
  Filled 2016-08-04 (×11): qty 2

## 2016-08-04 MED ORDER — SODIUM CHLORIDE 0.9 % IV SOLN
INTRAVENOUS | Status: AC
  Administered 2016-08-04: 20:00:00 via INTRAVENOUS

## 2016-08-04 MED ORDER — ACETAMINOPHEN 650 MG RE SUPP: 650.0000 mg | Freq: Four times a day (QID) | RECTAL | Status: DC | PRN

## 2016-08-04 MED ORDER — LORAZEPAM 2 MG/ML IJ SOLN
1.0000 mg | Freq: Four times a day (QID) | INTRAMUSCULAR | Status: DC
  Administered 2016-08-04 – 2016-08-05 (×3): 1 mg via INTRAVENOUS
  Filled 2016-08-04 (×3): qty 1

## 2016-08-04 MED ORDER — DIGOXIN 125 MCG PO TABS
125.0000 ug | ORAL_TABLET | Freq: Every day | ORAL | Status: DC
  Administered 2016-08-04 – 2016-08-07 (×4): 125 ug via ORAL
  Filled 2016-08-04 (×4): qty 1

## 2016-08-04 MED ORDER — LORAZEPAM 2 MG/ML IJ SOLN
0.5000 mg | Freq: Once | INTRAMUSCULAR | Status: AC
  Administered 2016-08-04: 0.5 mg via INTRAVENOUS

## 2016-08-04 MED ORDER — ONDANSETRON HCL 4 MG PO TABS: 4.0000 mg | ORAL_TABLET | Freq: Four times a day (QID) | ORAL | Status: DC | PRN

## 2016-08-04 MED ORDER — TRIAMCINOLONE ACETONIDE 0.5 % EX OINT
1.0000 "application " | TOPICAL_OINTMENT | Freq: Three times a day (TID) | CUTANEOUS | Status: DC
  Administered 2016-08-04 – 2016-08-07 (×5): 1 via TOPICAL
  Filled 2016-08-04: qty 15

## 2016-08-04 MED ORDER — LORAZEPAM 2 MG/ML IJ SOLN
INTRAMUSCULAR | Status: AC
  Administered 2016-08-04: 0.5 mg via INTRAVENOUS
  Filled 2016-08-04: qty 1

## 2016-08-04 MED ORDER — ACETAMINOPHEN 325 MG PO TABS
650.0000 mg | ORAL_TABLET | Freq: Four times a day (QID) | ORAL | Status: DC | PRN
  Administered 2016-08-07: 650 mg via ORAL
  Filled 2016-08-04: qty 2

## 2016-08-04 NOTE — ED Notes (Signed)
Report received. Pt currently in ct - pt fell and was seen yesterday for fall. In today for change of mental status. Normally lives in the independent living area, today confused

## 2016-08-04 NOTE — ED Notes (Signed)
Pt medicated for agitation and sitter at bedside. Daughter called earlier and stated she was driving down from [place].

## 2016-08-04 NOTE — Progress Notes (Signed)
Family Meeting Note  Advance Directive:yes  Today a meeting took place with the Patient DAUGHTER   Patient is unable to participate due [id number] capacity Acute encephalopathy on baseline dementia   The following clinical team members were present during this meeting:MD  The following were discussed:Patient's diagnosis: , Patient's progosis: Unable to determine and Goals for treatment: DNR. Daughter is the healthcare power of attorney  Additional follow-up to be provided: Hospitalist team  Time spent during discussion:18 Malon Kindle, MD

## 2016-08-04 NOTE — ED Notes (Addendum)
Pt awake, walked to BR w/ assist. Pt given graham crackers and peanut butter

## 2016-08-04 NOTE — Progress Notes (Signed)
ANTIBIOTIC CONSULT NOTE - INITIAL  Pharmacy Consult for Vancomycin, Cefepime  Indication: sepsis  Allergies  Allergen Reactions  . Sulfonamide Derivatives     Patient Measurements: Height: 5\' 3"  (160 cm) Weight: 125 lb (56.7 kg) IBW/kg (Calculated) : 52.4 Adjusted Body Weight: 54.12 kg   Vital Signs: Temp: 98.2 F (36.8 C) (02/10 1054) Temp Source: Oral (02/10 1054) BP: 187/98 (02/10 1648) Pulse Rate: 79 (02/10 1648) Intake/Output from previous day: No intake/output data recorded. Intake/Output from this shift: No intake/output data recorded.  Labs:  Recent Labs  08/03/16 1426 08/04/16 1104  WBC 11.4* 11.4*  HGB 15.0 14.7  PLT 247 218  CREATININE 0.86 0.82   Estimated Creatinine Clearance: 37 mL/min (by C-G formula based on SCr of 0.82 mg/dL). No results for input(s): VANCOTROUGH, VANCOPEAK, VANCORANDOM, GENTTROUGH, GENTPEAK, GENTRANDOM, TOBRATROUGH, TOBRAPEAK, TOBRARND, AMIKACINPEAK, AMIKACINTROU, AMIKACIN in the last 72 hours.   Microbiology: No results found for this or any previous visit (from the past 720 hour(s)).  Medical History: Past Medical History:  Diagnosis Date  . Arthritis   . Atrial fibrillation [facility]) Sept. 2011   [facility] Admission for RVR  . Chronic low back pain    due to lumbosacral degenerative joint disease  . Disturbance of skin sensation   . Hyperlipidemia   . Hypertension   . Mitral valve disorders(424.0)   . Osteoporosis   . Sciatica   . Thyroid disease    Hypothyroidism  . Vascular dementia, uncomplicated     Medications:  Prescriptions Prior to Admission  Medication Sig Dispense Refill Last Dose  . alendronate (FOSAMAX) 70 MG tablet Take 1 tablet (70 mg total) by mouth once a week. Take with a full glass of water on an empty stomach. 12 tablet 0 08/03/2016 at Unknown time  . digoxin (LANOXIN) 0.125 MG tablet TAKE 1 TABLET EVERY DAY 90 tablet 0 08/03/2016 at 0800  . ergocalciferol (DRISDOL) 50000 UNITS capsule Take 1 capsule  (50,000 Units total) by mouth once a week. 4 capsule 2 Unknown at Unknown  . felodipine (PLENDIL) 2.5 MG 24 hr tablet TAKE 1 TABLET BY MOUTH EVERY DAY 90 tablet 3 08/03/2016 at 0800  . metoprolol (LOPRESSOR) 50 MG tablet Take 1 tablet (50 mg total) by mouth 2 (two) times daily. 180 tablet 3 08/03/2016 at 2000  . acyclovir (ZOVIRAX) 400 MG tablet Take 1 tablet (400 mg total) by mouth 5 (five) times daily. (Patient not taking: Reported on 08/04/2016) 35 tablet 0 Completed Course at Unknown time  . ARMOUR THYROID 60 MG tablet TAKE 1 TABLET (60 MG TOTAL) BY MOUTH DAILY. (Patient not taking: Reported on 08/04/2016) 90 tablet 3 Not Taking at Unknown time  . Ascorbic Acid (VITAMIN C) 500 MG tablet Take 500 mg by mouth daily.     Unknown at Unknown  . benzocaine (ANBESOL JR) 10 % mucosal gel Apply to lips as needed for painful blisters 5.3 g 0 PRN at PRN  . Calcium Carbonate-Vit D-Min (CALCIUM 1200 PO) Take 1 capsule by mouth daily.     Unknown at Unknown  . dabigatran (PRADAXA) 150 MG CAPS capsule Take 1 capsule (150 mg total) by mouth 2 (two) times daily. (Patient not taking: Reported on 08/04/2016) 60 capsule 3 Not Taking at Unknown time  . felodipine (PLENDIL) 2.5 MG 24 hr tablet TAKE 1 TABLET BY MOUTH EVERY DAY 90 tablet 3 Taking  . fish oil-omega-3 fatty acids 1000 MG capsule Take 1 g by mouth daily.     Unknown at Unknown  .  L-Lysine 500 MG TABS Take 1 tablet by mouth daily.     Unknown at Unknown  . lidocaine (XYLOCAINE) 2 % jelly Apply 1 application topically as needed. (Patient not taking: Reported on 08/04/2016) 30 mL 0 Completed Course at Unknown time  . potassium chloride SA (KLOR-CON M20) 20 MEQ tablet Take 1 tablet (20 mEq total) by mouth daily. (Patient not taking: Reported on 08/04/2016) 90 tablet 3 Not Taking at Unknown time  . Selenium 200 MCG TABS Take 1 tablet by mouth daily.     Unknown at Unknown  . traZODone (DESYREL) 50 MG tablet TAKE 1.5 TABLETS (75 MG TOTAL) BY MOUTH AT BEDTIME. (Patient not  taking: Reported on 08/04/2016) 135 tablet 1 Not Taking at Unknown time  . triamcinolone ointment (KENALOG) 0.5 % APPLY TOPICALLY 2 TIMES DAILY TO leg rash  UNTIL RESOLVED (Patient not taking: Reported on 08/04/2016) 30 g 1 Completed Course at Unknown time  . triamcinolone ointment (KENALOG) 0.5 % APPLY TOPICALLY 2 TIMES DAILY TO FACIAL RASH UNTIL RESOLVED (Patient not taking: Reported on 08/04/2016) 30 g 1 Completed Course at Unknown time   Assessment: CrCl = 37 ml/min Ke = 0.036 hr-1 Vd = 37.9 L  T1/2 = 19.3 hrs  Goal of Therapy:  Vancomycin trough level 15-20 mcg/ml  Plan:  Expected duration 7 days with resolution of temperature and/or normalization of WBC   Cefepime 2 gm IV Q12H ordered to start 2/10 @ 20:00.  Vancomycin 750 mg IV X 1 ordered to be given on 2/10 @ 20:00. Vancomycin 750 mg IV Q24H ordered to start on 2/11 @ 4:00, ~ 8 hrs after 1st dose (stacked dosing). This pt will reach Css by 2/14 @ 20:00. Will draw 1st trough on 2/14 @ 0330, which will be approaching Css.   Jentri Aye D 08/04/2016,7:37 PM

## 2016-08-04 NOTE — ED Notes (Signed)
Pt confused; I am sitting with her at this time

## 2016-08-04 NOTE — ED Notes (Signed)
Pt daughter said pt said she was hungry and thirsty. Admitting doctor said I could give food. Upon returning to room pt had fallen back to sleep. Pt daughter has ice chips to give when she wakes up and to let us know if she decides she still wants food.  Lm edt

## 2016-08-04 NOTE — H&P (Signed)
[place] [facility] [place] NAME: Donna Rosario    MR#:  [id number]  DATE OF BIRTH:  03/02/1923  DATE OF ADMISSION:  08/04/2016  PRIMARY CARE PHYSICIAN: Crecencio Mc, MD   REQUESTING/REFERRING PHYSICIAN: Orbie Pyo, MD  CHIEF COMPLAINT:  AMS  HISTORY OF PRESENT ILLNESS:  Donna Rosario  is a 83 y.o. female with a known history of Chronic atrial fibrillation on Pradaxa, hyperlipidemia, hypertension, baseline dementia and multiple other medical problems was evaluated in the emergency department yesterday after she sustained a fall and had sutures on the base of her chin and was discharged. Today patient was found to be more confused by the medical staff and called EMS. Patient is brought into the emergency department. CT head and neck are negative. Hospitalist team is called to admit the patient  PAST MEDICAL HISTORY:   Past Medical History:  Diagnosis Date  . Arthritis   . Atrial fibrillation [facility]) Sept. 2011   [facility] Admission for RVR  . Chronic low back pain    due to lumbosacral degenerative joint disease  . Disturbance of skin sensation   . Hyperlipidemia   . Hypertension   . Mitral valve disorders(424.0)   . Osteoporosis   . Sciatica   . Thyroid disease    Hypothyroidism  . Vascular dementia, uncomplicated     PAST SURGICAL HISTOIRY:   Past Surgical History:  Procedure Laterality Date  . JOINT REPLACEMENT     Right Hip Oct 2011,  Left Knee 2008 ([facility])  . KNEE SURGERY     Left knee  . THYROIDECTOMY    . TOTAL HIP ARTHROPLASTY  2011    SOCIAL HISTORY:   Social History  Substance Use Topics  . Smoking status: Never Smoker  . Smokeless tobacco: Never Used  . Alcohol use 3.0 oz/week    5 Glasses of wine per week     Comment: occasional    FAMILY HISTORY:   Family History  Problem Relation Age of Onset  . Other Mother     CHF  . Osteoporosis Mother   . Diabetes Mother   . Other Father      unknown  . Osteoporosis Father   . Heart disease Father     DRUG ALLERGIES:   Allergies  Allergen Reactions  . Sulfonamide Derivatives     REVIEW OF SYSTEMS:  Review of systems unobtainable as the patient is altered  MEDICATIONS AT HOME:   Prior to Admission medications   Medication Sig Start Date End Date Taking? Authorizing Provider  alendronate (FOSAMAX) 70 MG tablet Take 1 tablet (70 mg total) by mouth once a week. Take with a full glass of water on an empty stomach. 07/28/16  Yes Crecencio Mc, MD  digoxin (LANOXIN) 0.125 MG tablet TAKE 1 TABLET EVERY DAY 05/30/16  Yes Crecencio Mc, MD  ergocalciferol (DRISDOL) 50000 UNITS capsule Take 1 capsule (50,000 Units total) by mouth once a week. 06/01/15  Yes Crecencio Mc, MD  felodipine (PLENDIL) 2.5 MG 24 hr tablet TAKE 1 TABLET BY MOUTH EVERY DAY 10/28/14  Yes Minna Merritts, MD  metoprolol (LOPRESSOR) 50 MG tablet Take 1 tablet (50 mg total) by mouth 2 (two) times daily. 06/27/16  Yes Crecencio Mc, MD  acyclovir (ZOVIRAX) 400 MG tablet Take 1 tablet (400 mg total) by mouth 5 (five) times daily. Patient not taking: Reported on 08/04/2016 06/04/16   Crecencio Mc, MD  ARMOUR THYROID  60 MG tablet TAKE 1 TABLET (60 MG TOTAL) BY MOUTH DAILY. Patient not taking: Reported on 08/04/2016 03/09/14   Crecencio Mc, MD  Ascorbic Acid (VITAMIN C) 500 MG tablet Take 500 mg by mouth daily.      Historical Provider, MD  benzocaine (ANBESOL JR) 10 % mucosal gel Apply to lips as needed for painful blisters 06/04/16   Crecencio Mc, MD  Calcium Carbonate-Vit D-Min (CALCIUM 1200 PO) Take 1 capsule by mouth daily.      Historical Provider, MD  dabigatran (PRADAXA) 150 MG CAPS capsule Take 1 capsule (150 mg total) by mouth 2 (two) times daily. Patient not taking: Reported on 08/04/2016 10/12/13   Crecencio Mc, MD  felodipine (PLENDIL) 2.5 MG 24 hr tablet TAKE 1 TABLET BY MOUTH EVERY DAY 11/28/15   Minna Merritts, MD  fish oil-omega-3 fatty acids 1000  MG capsule Take 1 g by mouth daily.      Historical Provider, MD  L-Lysine 500 MG TABS Take 1 tablet by mouth daily.      Historical Provider, MD  lidocaine (XYLOCAINE) 2 % jelly Apply 1 application topically as needed. Patient not taking: Reported on 08/04/2016 04/03/16   Crecencio Mc, MD  potassium chloride SA (KLOR-CON M20) 20 MEQ tablet Take 1 tablet (20 mEq total) by mouth daily. Patient not taking: Reported on 08/04/2016 09/30/12   Minna Merritts, MD  Selenium 200 MCG TABS Take 1 tablet by mouth daily.      Historical Provider, MD  traZODone (DESYREL) 50 MG tablet TAKE 1.5 TABLETS (75 MG TOTAL) BY MOUTH AT BEDTIME. Patient not taking: Reported on 08/04/2016 11/23/15   Crecencio Mc, MD  triamcinolone ointment (KENALOG) 0.5 % APPLY TOPICALLY 2 TIMES DAILY TO leg rash  UNTIL RESOLVED Patient not taking: Reported on 08/04/2016 05/30/15   Crecencio Mc, MD  triamcinolone ointment (KENALOG) 0.5 % APPLY TOPICALLY 2 TIMES DAILY TO FACIAL RASH UNTIL RESOLVED Patient not taking: Reported on 08/04/2016 06/24/15   Crecencio Mc, MD      VITAL SIGNS:  Blood pressure (!) 162/87, pulse (!) 106, temperature 98.2 F (36.8 C), temperature source Oral, resp. rate (!) 23, height 5\' 3"  (1.6 m), weight 56.7 kg (125 lb), SpO2 98 %.  PHYSICAL EXAMINATION:  GENERAL:  83 y.o.-year-old patient lying in the bed with no acute distress.  EYES: Pupils equal, round, reactive to light and accommodation. No scleral icterus. Extraocular muscles intact.  HEENT: Head atraumatic, normocephalic. Oropharynx and nasopharynx clear. Base of the chin with sutures from yesterday NECK:  Supple, no jugular venous distention. No thyroid enlargement, no tenderness.  LUNGS: Normal breath sounds bilaterally, no wheezing, rales,rhonchi or crepitation. No use of accessory muscles of respiration.  CARDIOVASCULAR: S1, S2 normal. No murmurs, rubs, or gallops.  ABDOMEN: Soft, nontender, nondistended. Bowel sounds present. No organomegaly or  mass.  EXTREMITIES: No pedal edema, cyanosis, or clubbing.  NEUROLOGIC: Patient is disoriented but alert. Sensation grossly intact. Gait not checked.  PSYCHIATRIC: The patient is alert and Disoriented  SKIN: No obvious rash, lesion, or ulcer.   LABORATORY PANEL:   CBC  Recent Labs Lab 08/04/16 1104  WBC 11.4*  HGB 14.7  HCT 44.9  PLT 218   ------------------------------------------------------------------------------------------------------------------  Chemistries   Recent Labs Lab 08/04/16 1104  NA 138  K 4.1  CL 106  CO2 23  GLUCOSE 120*  BUN 16  CREATININE 0.82  CALCIUM 8.6*  AST 32  ALT 16  ALKPHOS 60  BILITOT 1.6*   ------------------------------------------------------------------------------------------------------------------  Cardiac Enzymes  Recent Labs Lab 08/04/16 1104  TROPONINI <0.03   ------------------------------------------------------------------------------------------------------------------  RADIOLOGY:  Dg Chest 1 View  Result Date: 08/04/2016 CLINICAL DATA:  Recent fall.  Confusion. EXAM: CHEST 1 VIEW COMPARISON:  August 03, 2016 FINDINGS: Healed rib fractures in the upper left chest. No acute bony abnormalities. No pneumothorax. Stable cardiomegaly. The hila and mediastinum are normal. The lungs are unremarkable with no nodules, masses, or focal infiltrates. IMPRESSION: No active disease. Electronically Signed   By: Dorise Bullion III M.D   On: 08/04/2016 11:49   Dg Chest 2 View  Result Date: 08/03/2016 CLINICAL DATA:  Fall while walking.  Initial encounter. EXAM: CHEST  2 VIEW COMPARISON:  03/02/2010 FINDINGS: Stable mild to moderate cardiomegaly.  Aortic atherosclerosis. Pulmonary hyperinflation again seen, consistent with COPD. No evidence of pulmonary infiltrate or edema. No evidence of pneumothorax or pleural effusion. Several old left upper rib fracture deformities again noted. IMPRESSION: Stable cardiomegaly and COPD.  No acute  findings. Electronically Signed   By: Earle Gell M.D.   On: 08/03/2016 14:31   Dg Pelvis 1-2 Views  Result Date: 08/04/2016 CLINICAL DATA:  Recent fall. EXAM: PELVIS - 1-2 VIEW COMPARISON:  None. FINDINGS: The patient is status post right hip replacement. The femoral component is not completely visualized. Within visualize limits, the hardware is in good position. No acute fractures are seen. Degenerative changes seen in the lower lumbar spine. IMPRESSION: No acute abnormalities. Electronically Signed   By: Dorise Bullion III M.D   On: 08/04/2016 11:48   Ct Head Wo Contrast  Result Date: 08/04/2016 CLINICAL DATA:  Confusion.  Fall yesterday. EXAM: CT HEAD WITHOUT CONTRAST CT CERVICAL SPINE WITHOUT CONTRAST TECHNIQUE: Multidetector CT imaging of the head and cervical spine was performed following the standard protocol without intravenous contrast. Multiplanar CT image reconstructions of the cervical spine were also generated. COMPARISON:  08/03/2016 FINDINGS: CT HEAD FINDINGS Brain: There is atrophy and chronic small vessel disease changes. No acute intracranial abnormality. Specifically, no hemorrhage, hydrocephalus, mass lesion, acute infarction, or significant intracranial injury. Vascular: No hyperdense vessel or unexpected calcification. Skull: No acute calvarial abnormality. Sinuses/Orbits: Visualized paranasal sinuses and mastoids clear. Orbital soft tissues unremarkable. Other: None CT CERVICAL SPINE FINDINGS Alignment: Normal alignment. Skull base and vertebrae: No acute fracture. No primary bone lesion or focal pathologic process. Soft tissues and spinal canal: No prevertebral fluid or swelling. No visible canal hematoma. Disc levels: Diffuse disc space narrowing and spurring. Degenerative facet disease bilaterally. Upper chest: Negative Other: Calcified and noncalcified thyroid nodules noted. IMPRESSION: No acute intracranial abnormality.Atrophy, chronic microvascular disease. Degenerative disc  and facet disease throughout the cervical spine. No acute bony abnormality. Electronically Signed   By: Rolm Baptise M.D.   On: 08/04/2016 11:32   Ct Head Wo Contrast  Result Date: 08/03/2016 CLINICAL DATA:  Status post fall. EXAM: CT HEAD WITHOUT CONTRAST TECHNIQUE: Contiguous axial images were obtained from the base of the skull through the vertex without intravenous contrast. COMPARISON:  None. FINDINGS: Brain: No evidence of acute infarction, hemorrhage, extra-axial collection, ventriculomegaly, or mass effect. Generalized cerebral atrophy. Periventricular white matter low attenuation likely secondary to microangiopathy. Vascular: Cerebrovascular atherosclerotic calcifications are noted. Skull: Negative for fracture or focal lesion. Sinuses/Orbits: Visualized portions of the orbits are unremarkable. Visualized portions of the paranasal sinuses and mastoid air cells are unremarkable. Other: None. IMPRESSION: 1. No acute intracranial pathology. 2. Chronic microvascular disease and cerebral atrophy. Electronically Signed   By:  Kathreen Devoid   On: 08/03/2016 13:25   Ct Cervical Spine Wo Contrast  Result Date: 08/04/2016 CLINICAL DATA:  Confusion.  Fall yesterday. EXAM: CT HEAD WITHOUT CONTRAST CT CERVICAL SPINE WITHOUT CONTRAST TECHNIQUE: Multidetector CT imaging of the head and cervical spine was performed following the standard protocol without intravenous contrast. Multiplanar CT image reconstructions of the cervical spine were also generated. COMPARISON:  08/03/2016 FINDINGS: CT HEAD FINDINGS Brain: There is atrophy and chronic small vessel disease changes. No acute intracranial abnormality. Specifically, no hemorrhage, hydrocephalus, mass lesion, acute infarction, or significant intracranial injury. Vascular: No hyperdense vessel or unexpected calcification. Skull: No acute calvarial abnormality. Sinuses/Orbits: Visualized paranasal sinuses and mastoids clear. Orbital soft tissues unremarkable. Other:  None CT CERVICAL SPINE FINDINGS Alignment: Normal alignment. Skull base and vertebrae: No acute fracture. No primary bone lesion or focal pathologic process. Soft tissues and spinal canal: No prevertebral fluid or swelling. No visible canal hematoma. Disc levels: Diffuse disc space narrowing and spurring. Degenerative facet disease bilaterally. Upper chest: Negative Other: Calcified and noncalcified thyroid nodules noted. IMPRESSION: No acute intracranial abnormality.Atrophy, chronic microvascular disease. Degenerative disc and facet disease throughout the cervical spine. No acute bony abnormality. Electronically Signed   By: Rolm Baptise M.D.   On: 08/04/2016 11:32   Dg Hand Complete Left  Result Date: 08/03/2016 CLINICAL DATA:  [place] [address] getting out of car, landing on LEFT hand. Walks with cane. EXAM: LEFT HAND - COMPLETE 3+ VIEW COMPARISON:  None. FINDINGS: No acute fracture deformity dislocation. Severe first carpometacarpal joint space narrowing, periarticular sclerosis and marginal spurring compatible with osteoarthrosis. Mild to moderate interphalangeal joint osteoarthrosis. Osteopenia without destructive bony lesions. Soft tissue planes are nonsuspicious. IMPRESSION: No acute fracture deformity or dislocation. Please note, osteopenia decreases sensitivity for acute nondisplaced fractures. Degenerative change including severe first carpometacarpal osteoarthrosis. Electronically Signed   By: Elon Alas M.D.   On: 08/03/2016 14:38    EKG:   Orders placed or performed during the hospital encounter of 08/04/16  . ED EKG  . ED EKG    IMPRESSION AND PLAN:   Donna Rosario  is a 83 y.o. female with a known history of Chronic atrial fibrillation on Pradaxa, hyperlipidemia, hypertension, baseline dementia and multiple other medical problems was evaluated in the emergency department yesterday after she sustained a fall and had sutures on the base of her chin and was discharged. Today patient was found to  be more confused by the medical staff and called EMS  #Acute encephalopathy on baseline dementia etiology unclear CT head and CT neck are normal Admit to MedSurg unit Neuro checks We will get MRI of the brain tomorrow Consider neurology consult if no improvement by tomorrow Nothing by mouth until she passes bedside swallow evaluation  #Sepsis meets criteria with leukocytosis and tachycardia Urine cultures are done which are pending Blood cultures are pending Chest x-ray negative Start the patient on empiric antibiotics cefepime and vancomycin   #Chronic atrial fibrillation rate controlled Continue home medications of the patient passes bedside swallow evaluation including Pradaxa for anticoagulation   #Chronic vascular dementia Patient is not at her baseline according to the medical staff and daughter   #Hypertension Will resume her home medications after patient passes bedside swallow evaluation   All the records are reviewed and case discussed with ED provider. Management plans discussed with the patient, family and they are in agreement.  CODE STATUS: DNR,Daughter Meredith Mody bever is the healthcare power of attorney  TOTAL TIME TAKING CARE OF THIS PATIENT:  45 minutes.   Note: This dictation was prepared with Dragon dictation along with smaller phrase technology. Any transcriptional errors that result from this process are unintentional.  Nicholes Mango M.D on 08/04/2016 at 2:11 PM  Between 7am to 6pm - Pager - [phone]  After 6pm go to [email or web address] - password [email or web address] [facility]  Office  [phone]  CC: Primary care physician; Crecencio Mc, MD

## 2016-08-04 NOTE — ED Notes (Signed)
Pt sleeping but easily arousable

## 2016-08-04 NOTE — ED Notes (Signed)
This tech witnessed pt eat peanut butter crackers. Attempted to give pt milk but refused. Currently sitting with pt.

## 2016-08-04 NOTE — ED Provider Notes (Signed)
[facility] Emergency Department Provider Note  ____________________________________________   First MD Initiated Contact with Patient 08/04/16 1054     (approximate)  I have reviewed the triage vital signs and the nursing notes.   HISTORY  Chief Complaint Altered Mental Status   HPI Donna Rosario is a 83 y.o. female history of atrial fibrillation who was seen in the emergency department yesterday for fall is presenting with altered mental status. Per EMS, she was found by staff on the floor in her independent living unit this morning. The patient is unable to give [place] an accurate history. Upon review of her note yesterday also says that she fell and also was not ill to give an accurate history. It is unknown when her last normal baseline time occurred. However, EMS reports that the staff at her independent living says that her confusion this morning is greatly increased from her baseline. She is denying any pain at this time.   Past Medical History:  Diagnosis Date  . Arthritis   . Atrial fibrillation [facility]) Sept. 2011   [facility] Admission for RVR  . Chronic low back pain    due to lumbosacral degenerative joint disease  . Disturbance of skin sensation   . Hyperlipidemia   . Hypertension   . Mitral valve disorders(424.0)   . Osteoporosis   . Sciatica   . Thyroid disease    Hypothyroidism  . Vascular dementia, uncomplicated     Patient Active Problem List   Diagnosis Date Noted  . Herpes zoster infection of oral mucosa 06/05/2016  . Erroneous encounter - disregard 07/25/2015  . Vitamin D deficiency 06/01/2015  . Hypothyroidism 05/30/2015  . Acute right hip pain 06/10/2014  . Basal cell carcinoma of temple region 12/15/2013  . Chronic diastolic CHF (congestive heart failure) ([place]) 08/10/2013  . Chest pain 02/11/2013  . Pityriasis rosea-like drug eruption 09/26/2012  . Allergic urticaria 09/23/2012  . Dizziness 04/09/2012  . Insomnia 10/31/2011  .  Venous insufficiency of leg 03/11/2011  . Dementia 03/09/2011  . DIASTOLIC DYSFUNCTION [id number]  . HYPERTENSION, BENIGN 03/30/2010  . ATRIAL FIBRILLATION 03/30/2010  . Edema 03/30/2010    Past Surgical History:  Procedure Laterality Date  . JOINT REPLACEMENT     Right Hip Oct 2011,  Left Knee 2008 ([place])  . KNEE SURGERY     Left knee  . THYROIDECTOMY    . TOTAL HIP ARTHROPLASTY  2011    Prior to Admission medications   Medication Sig Start Date End Date Taking? Authorizing Provider  alendronate (FOSAMAX) 70 MG tablet Take 1 tablet (70 mg total) by mouth once a week. Take with a full glass of water on an empty stomach. 07/28/16  Yes Crecencio Mc, MD  digoxin (LANOXIN) 0.125 MG tablet TAKE 1 TABLET EVERY DAY 05/30/16  Yes Crecencio Mc, MD  ergocalciferol (DRISDOL) 50000 UNITS capsule Take 1 capsule (50,000 Units total) by mouth once a week. 06/01/15  Yes Crecencio Mc, MD  felodipine (PLENDIL) 2.5 MG 24 hr tablet TAKE 1 TABLET BY MOUTH EVERY DAY 10/28/14  Yes Minna Merritts, MD  metoprolol (LOPRESSOR) 50 MG tablet Take 1 tablet (50 mg total) by mouth 2 (two) times daily. 06/27/16  Yes Crecencio Mc, MD  acyclovir (ZOVIRAX) 400 MG tablet Take 1 tablet (400 mg total) by mouth 5 (five) times daily. Patient not taking: Reported on 08/04/2016 06/04/16   Crecencio Mc, MD  ARMOUR THYROID 60 MG tablet TAKE 1 TABLET (60  MG TOTAL) BY MOUTH DAILY. Patient not taking: Reported on 08/04/2016 03/09/14   Crecencio Mc, MD  Ascorbic Acid (VITAMIN C) 500 MG tablet Take 500 mg by mouth daily.      Historical Provider, MD  benzocaine (ANBESOL JR) 10 % mucosal gel Apply to lips as needed for painful blisters 06/04/16   Crecencio Mc, MD  Calcium Carbonate-Vit D-Min (CALCIUM 1200 PO) Take 1 capsule by mouth daily.      Historical Provider, MD  dabigatran (PRADAXA) 150 MG CAPS capsule Take 1 capsule (150 mg total) by mouth 2 (two) times daily. Patient not taking: Reported on 08/04/2016 10/12/13   Crecencio Mc, MD  felodipine (PLENDIL) 2.5 MG 24 hr tablet TAKE 1 TABLET BY MOUTH EVERY DAY 11/28/15   Minna Merritts, MD  fish oil-omega-3 fatty acids 1000 MG capsule Take 1 g by mouth daily.      Historical Provider, MD  L-Lysine 500 MG TABS Take 1 tablet by mouth daily.      Historical Provider, MD  lidocaine (XYLOCAINE) 2 % jelly Apply 1 application topically as needed. Patient not taking: Reported on 08/04/2016 04/03/16   Crecencio Mc, MD  potassium chloride SA (KLOR-CON M20) 20 MEQ tablet Take 1 tablet (20 mEq total) by mouth daily. Patient not taking: Reported on 08/04/2016 09/30/12   Minna Merritts, MD  Selenium 200 MCG TABS Take 1 tablet by mouth daily.      Historical Provider, MD  traZODone (DESYREL) 50 MG tablet TAKE 1.5 TABLETS (75 MG TOTAL) BY MOUTH AT BEDTIME. Patient not taking: Reported on 08/04/2016 11/23/15   Crecencio Mc, MD  triamcinolone ointment (KENALOG) 0.5 % APPLY TOPICALLY 2 TIMES DAILY TO leg rash  UNTIL RESOLVED Patient not taking: Reported on 08/04/2016 05/30/15   Crecencio Mc, MD  triamcinolone ointment (KENALOG) 0.5 % APPLY TOPICALLY 2 TIMES DAILY TO FACIAL RASH UNTIL RESOLVED Patient not taking: Reported on 08/04/2016 06/24/15   Crecencio Mc, MD    Allergies Sulfonamide derivatives  Family History  Problem Relation Age of Onset  . Other Mother     CHF  . Osteoporosis Mother   . Diabetes Mother   . Other Father     unknown  . Osteoporosis Father   . Heart disease Father     Social History Social History  Substance Use Topics  . Smoking status: Never Smoker  . Smokeless tobacco: Never Used  . Alcohol use 3.0 oz/week    5 Glasses of wine per week     Comment: occasional    Review of Systems Constitutional: No fever/chills Eyes: No visual changes. ENT: No sore throat. Cardiovascular: Denies chest pain. Respiratory: Denies shortness of breath. Gastrointestinal: No abdominal pain.  No nausea, no vomiting.  No diarrhea.  No  constipation. Genitourinary: Negative for dysuria. Musculoskeletal: Negative for back pain. Skin: Negative for rash. Neurological: Negative for headaches, focal weakness or numbness.  10-point ROS otherwise negative. However, the review of systems is confounded by the patient's altered mental status.  ____________________________________________   PHYSICAL EXAM:  VITAL SIGNS: ED Triage Vitals  Enc Vitals Group     BP 08/04/16 1054 (!) 182/106     Pulse Rate 08/04/16 1054 74     Resp 08/04/16 1054 18     Temp 08/04/16 1054 98.2 F (36.8 C)     Temp Source 08/04/16 1054 Oral     SpO2 08/04/16 1054 100 %     Weight 08/04/16 1105  125 lb (56.7 kg)     Height 08/04/16 1105 5\' 3"  (1.6 m)     Head Circumference --      Peak Flow --      Pain Score --      Pain Loc --      Pain Edu? --      Excl. in [place]? --     Constitutional: Alert and in no acute distress.  Patient is alert and oriented 0. Eyes: Conjunctivae are normal. PERRL. EOMI. Head:  Stitches intact under the chin on the right. Surrounding ecchymosis. Otherwise there is no obvious trauma above the shoulders. Nose: No congestion/rhinnorhea. Mouth/Throat: Mucous membranes are moist.   Neck: No stridor.  No tenderness palpation to the midline cervical spine. No deformity or step-off. Cardiovascular: Normal rate with an irregularly irregular rhythm. Grossly normal heart sounds.  Respiratory: Normal respiratory effort.  No retractions. Lungs CTAB. Gastrointestinal: Soft and nontender. No distention.  Musculoskeletal: No lower extremity tenderness nor edema.  No joint effusions. No tenderness to the bilateral hips.5 out of 5 Strength bilateral lower extremities. Neurologic:  Normal speech and language. No gross focal neurologic deficits are appreciated.  Skin:  Skin is warm, dry and intact. No rash noted. Psychiatric: Mood and affect are normal. Speech and behavior are normal.  ____________________________________________    LABS (all labs ordered are listed, but only abnormal results are displayed)  Labs Reviewed  CBC WITH DIFFERENTIAL/PLATELET - Abnormal; Notable for the following:       Result Value   WBC 11.4 (*)    RDW 15.2 (*)    Neutro Abs 8.6 (*)    Monocytes Absolute 1.0 (*)    All other components within normal limits  COMPREHENSIVE METABOLIC PANEL - Abnormal; Notable for the following:    Glucose, Bld 120 (*)    Calcium 8.6 (*)    Total Bilirubin 1.6 (*)    All other components within normal limits  URINALYSIS, COMPLETE (UACMP) WITH MICROSCOPIC - Abnormal; Notable for the following:    Color, Urine YELLOW (*)    APPearance CLEAR (*)    Ketones, ur 5 (*)    All other components within normal limits  URINE CULTURE  LIPASE, BLOOD  TROPONIN I  CK  DIGOXIN LEVEL  TSH   ____________________________________________  EKG   ED ECG REPORT I, Doran Stabler, the attending physician, personally viewed and interpreted this ECG.   Date: 08/04/2016  EKG Time: 1100  Rate: 82  Rhythm: atrial fibrillation, rate 82  Axis: normal   Intervals:Normal  ST&T Change: No ST segment elevation or depression. T-wave inversion in aVL.  ____________________________________________  [id number]  DG Chest 1 View (Final result)  Result time 08/04/16 11:49:15  Final result by York Ram, MD (08/04/16 11:49:15)           Narrative:   CLINICAL DATA: Recent fall. Confusion.  EXAM: CHEST 1 VIEW  COMPARISON: August 03, 2016  FINDINGS: Healed rib fractures in the upper left chest. No acute bony abnormalities. No pneumothorax. Stable cardiomegaly. The hila and mediastinum are normal. The lungs are unremarkable with no nodules, masses, or focal infiltrates.  IMPRESSION: No active disease.   Electronically Signed By: Dorise Bullion III M.D On: 08/04/2016 11:49            CT Head Wo Contrast (Final result)  Result time 08/04/16 11:32:51  Final result by Rolm Baptise, MD (08/04/16 11:32:51)  Narrative:   CLINICAL DATA: Confusion. Fall yesterday.  EXAM: CT HEAD WITHOUT CONTRAST  CT CERVICAL SPINE WITHOUT CONTRAST  TECHNIQUE: Multidetector CT imaging of the head and cervical spine was performed following the standard protocol without intravenous contrast. Multiplanar CT image reconstructions of the cervical spine were also generated.  COMPARISON: 08/03/2016  FINDINGS: CT HEAD FINDINGS  Brain: There is atrophy and chronic small vessel disease changes. No acute intracranial abnormality. Specifically, no hemorrhage, hydrocephalus, mass lesion, acute infarction, or significant intracranial injury.  Vascular: No hyperdense vessel or unexpected calcification.  Skull: No acute calvarial abnormality.  Sinuses/Orbits: Visualized paranasal sinuses and mastoids clear. Orbital soft tissues unremarkable.  Other: None  CT CERVICAL SPINE FINDINGS  Alignment: Normal alignment.  Skull base and vertebrae: No acute fracture. No primary bone lesion or focal pathologic process.  Soft tissues and spinal canal: No prevertebral fluid or swelling. No visible canal hematoma.  Disc levels: Diffuse disc space narrowing and spurring. Degenerative facet disease bilaterally.  Upper chest: Negative  Other: Calcified and noncalcified thyroid nodules noted.  IMPRESSION: No acute intracranial abnormality.Atrophy, chronic microvascular disease.  Degenerative disc and facet disease throughout the cervical spine. No acute bony abnormality.   Electronically Signed By: Rolm Baptise M.D. On: 08/04/2016 11:32            CT Cervical Spine Wo Contrast (Final result)  Result time 08/04/16 11:32:51  Final result by Rolm Baptise, MD (08/04/16 11:32:51)           Narrative:   CLINICAL DATA: Confusion. Fall yesterday.  EXAM: CT HEAD WITHOUT CONTRAST  CT CERVICAL SPINE WITHOUT CONTRAST  TECHNIQUE: Multidetector CT  imaging of the head and cervical spine was performed following the standard protocol without intravenous contrast. Multiplanar CT image reconstructions of the cervical spine were also generated.  COMPARISON: 08/03/2016  FINDINGS: CT HEAD FINDINGS  Brain: There is atrophy and chronic small vessel disease changes. No acute intracranial abnormality. Specifically, no hemorrhage, hydrocephalus, mass lesion, acute infarction, or significant intracranial injury.  Vascular: No hyperdense vessel or unexpected calcification.  Skull: No acute calvarial abnormality.  Sinuses/Orbits: Visualized paranasal sinuses and mastoids clear. Orbital soft tissues unremarkable.  Other: None  CT CERVICAL SPINE FINDINGS  Alignment: Normal alignment.  Skull base and vertebrae: No acute fracture. No primary bone lesion or focal pathologic process.  Soft tissues and spinal canal: No prevertebral fluid or swelling. No visible canal hematoma.  Disc levels: Diffuse disc space narrowing and spurring. Degenerative facet disease bilaterally.  Upper chest: Negative  Other: Calcified and noncalcified thyroid nodules noted.  IMPRESSION: No acute intracranial abnormality.Atrophy, chronic microvascular disease.  Degenerative disc and facet disease throughout the cervical spine. No acute bony abnormality.   Electronically Signed By: Rolm Baptise M.D. On: 08/04/2016 11:32          ____________________________________________   PROCEDURES  Procedure(s) performed: None  Procedures  Critical Care performed: No  ____________________________________________   INITIAL IMPRESSION / ASSESSMENT AND PLAN / ED COURSE  Pertinent labs & imaging results that were available during my care of the patient were reviewed by me and considered in my medical decision making (see chart for details).  ----------------------------------------- 1:16 PM on  08/04/2016 -----------------------------------------  [place] still very confused. Overall has a very reassuring lab workup. I discussed the case with the patient's daughter, [employer], who says that she was just with her mother this Monday and she was acting normally at that time. The patient will be admitted for continued workup including an MRI. I discussed  this with the patient's daughter. I suspect that the patient could've had a cerebrovascular accident causing the abrupt change this week. Signed out to Dr. Margaretmary Eddy.        ____________________________________________   FINAL CLINICAL IMPRESSION(S) / ED DIAGNOSES  Altered mental status.    NEW MEDICATIONS STARTED DURING THIS VISIT:  New Prescriptions   No medications on file     Note:  This document was prepared using Dragon voice recognition software and may include unintentional dictation errors.    Orbie Pyo, MD 08/04/16 [phone]

## 2016-08-04 NOTE — ED Triage Notes (Signed)
Pt presents to ED via [facility] c/o AMS. Seen yesterday in this ED for fall and sent home to [facility]. Staff at SNF report pt was found this morning with marked increase in confusion. Pt has hx mild early dementia.

## 2016-08-04 NOTE — Progress Notes (Signed)
Patient arrived to unit from ED, confused and agitated. Refused to have vitals taken. Daughter at bedside.

## 2016-08-04 NOTE — ED Notes (Signed)
Assisted pt to toilet 

## 2016-08-05 ENCOUNTER — Inpatient Hospital Stay: Payer: Medicare Other

## 2016-08-05 DIAGNOSIS — I639 Cerebral infarction, unspecified: Secondary | ICD-10-CM

## 2016-08-05 LAB — CBC
HEMATOCRIT: 42.9 % (ref 35.0–47.0)
HEMOGLOBIN: 14.2 g/dL (ref 12.0–16.0)
MCH: 29.5 pg (ref 26.0–34.0)
MCHC: 33.1 g/dL (ref 32.0–36.0)
MCV: 89.3 fL (ref 80.0–100.0)
Platelets: 208 10*3/uL (ref 150–440)
RBC: 4.81 MIL/uL (ref 3.80–5.20)
RDW: 15 % — ABNORMAL HIGH (ref 11.5–14.5)
WBC: 10.9 10*3/uL (ref 3.6–11.0)

## 2016-08-05 LAB — COMPREHENSIVE METABOLIC PANEL
ALBUMIN: 3.5 g/dL (ref 3.5–5.0)
ALT: 15 U/L (ref 14–54)
ANION GAP: 7 (ref 5–15)
AST: 29 U/L (ref 15–41)
Alkaline Phosphatase: 53 U/L (ref 38–126)
BUN: 15 mg/dL (ref 6–20)
CHLORIDE: 109 mmol/L (ref 101–111)
CO2: 21 mmol/L — AB (ref 22–32)
Calcium: 8.1 mg/dL — ABNORMAL LOW (ref 8.9–10.3)
Creatinine, Ser: 0.71 mg/dL (ref 0.44–1.00)
GFR calc Af Amer: >60 mL/min (ref >60)
GFR calc non Af Amer: >60 mL/min (ref >60)
GLUCOSE: 108 mg/dL — AB (ref 65–99)
POTASSIUM: 3.8 mmol/L (ref 3.5–5.1)
SODIUM: 137 mmol/L (ref 135–145)
Total Bilirubin: 1.5 mg/dL — ABNORMAL HIGH (ref 0.3–1.2)
Total Protein: 6.5 g/dL (ref 6.5–8.1)

## 2016-08-05 LAB — TSH: TSH: 2.556 u[IU]/mL (ref 0.350–4.500)

## 2016-08-05 LAB — VITAMIN B12: VITAMIN B 12: 420 pg/mL (ref 180–914)

## 2016-08-05 LAB — AMMONIA: Ammonia: 9 umol/L (ref 9–35)

## 2016-08-05 MED ORDER — SODIUM CHLORIDE 0.9 % IV SOLN
INTRAVENOUS | Status: AC
  Administered 2016-08-05 (×2): via INTRAVENOUS

## 2016-08-05 MED ORDER — LORAZEPAM 2 MG/ML IJ SOLN
1.0000 mg | Freq: Four times a day (QID) | INTRAMUSCULAR | Status: DC | PRN
  Administered 2016-08-05: 1 mg via INTRAVENOUS
  Filled 2016-08-05: qty 1

## 2016-08-05 MED ORDER — ENOXAPARIN SODIUM 40 MG/0.4ML ~~LOC~~ SOLN
40.0000 mg | SUBCUTANEOUS | Status: DC
  Administered 2016-08-05: 40 mg via SUBCUTANEOUS
  Filled 2016-08-05: qty 0.4

## 2016-08-05 MED ORDER — HALOPERIDOL LACTATE 5 MG/ML IJ SOLN: 0.5000 mg | Freq: Four times a day (QID) | INTRAMUSCULAR | Status: DC | PRN

## 2016-08-05 MED ORDER — LABETALOL HCL 5 MG/ML IV SOLN
10.0000 mg | INTRAVENOUS | Status: DC | PRN
Start: 2016-08-05 — End: 2016-08-07
  Administered 2016-08-05 – 2016-08-07 (×2): 10 mg via INTRAVENOUS
  Filled 2016-08-05 (×3): qty 4

## 2016-08-05 MED ORDER — LABETALOL HCL 5 MG/ML IV SOLN: 10.0000 mg | INTRAVENOUS | Status: DC | PRN

## 2016-08-05 MED ORDER — ASPIRIN 300 MG RE SUPP
300.0000 mg | Freq: Every day | RECTAL | Status: DC
  Administered 2016-08-05 – 2016-08-06 (×2): 300 mg via RECTAL
  Filled 2016-08-05 (×3): qty 1

## 2016-08-05 MED ORDER — FELODIPINE ER 2.5 MG PO TB24
2.5000 mg | ORAL_TABLET | Freq: Every day | ORAL | Status: DC
  Administered 2016-08-05 – 2016-08-06 (×2): 2.5 mg via ORAL
  Filled 2016-08-05 (×2): qty 1

## 2016-08-05 NOTE — Progress Notes (Signed)
[facility] [place] NAME: Donna Rosario    MR#:  [id number]  DATE OF BIRTH:  June 01, 1923  SUBJECTIVE:  CHIEF COMPLAINT:   Chief Complaint  Patient presents with  . Altered Mental Status   - Extremely confused. Trying to get out of bed. MRI pending today. -Discussed with daughter over the phone. -No focal neurological deficits noted.  REVIEW OF SYSTEMS:  Review of Systems  Unable to perform ROS: Mental status change    DRUG ALLERGIES:   Allergies  Allergen Reactions  . Sulfonamide Derivatives     VITALS:  Blood pressure (!) 160/67, pulse 89, temperature 97.1 F (36.2 C), temperature source Axillary, resp. rate 20, height 5\' 3"  (1.6 m), weight 56.7 kg (125 lb), SpO2 100 %.  PHYSICAL EXAMINATION:  Physical Exam  GENERAL:  83 y.o.-year-old patient lying in the bed with no acute distress. Appears very confused. EYES: Pupils equal, round, reactive to light and accommodation. No scleral icterus. Extraocular muscles intact.  HEENT: Head atraumatic, normocephalic. Oropharynx and nasopharynx clear.  NECK:  Supple, no jugular venous distention. No thyroid enlargement, no tenderness.  LUNGS: Normal breath sounds bilaterally, no wheezing, rales,rhonchi or crepitation. No use of accessory muscles of respiration. Decreased bibasilar breath sounds CARDIOVASCULAR: S1, S2 normal. No rubs, or gallops. 2/6 systolic murmur is present ABDOMEN: Soft, nontender, nondistended. Bowel sounds present. No organomegaly or mass.  EXTREMITIES: No pedal edema, cyanosis, or clubbing.  NEUROLOGIC: Follows simple commands. Strength is 5/5 all 4 extremities. Sensation intact. Gait not checked.  PSYCHIATRIC: The patient is alert and not oriented at all. Follow simple commands.  SKIN: No obvious rash, lesion, or ulcer.    LABORATORY PANEL:   CBC  Recent Labs Lab 08/05/16 0404  WBC 10.9  HGB 14.2  HCT 42.9  PLT 208    ------------------------------------------------------------------------------------------------------------------  Chemistries   Recent Labs Lab 08/05/16 0404  NA 137  K 3.8  CL 109  CO2 21*  GLUCOSE 108*  BUN 15  CREATININE 0.71  CALCIUM 8.1*  AST 29  ALT 15  ALKPHOS 53  BILITOT 1.5*   ------------------------------------------------------------------------------------------------------------------  Cardiac Enzymes  Recent Labs Lab 08/04/16 1104  TROPONINI <0.03   ------------------------------------------------------------------------------------------------------------------  RADIOLOGY:  Dg Chest 1 View  Result Date: 08/04/2016 CLINICAL DATA:  Recent fall.  Confusion. EXAM: CHEST 1 VIEW COMPARISON:  August 03, 2016 FINDINGS: Healed rib fractures in the upper left chest. No acute bony abnormalities. No pneumothorax. Stable cardiomegaly. The hila and mediastinum are normal. The lungs are unremarkable with no nodules, masses, or focal infiltrates. IMPRESSION: No active disease. Electronically Signed   By: Dorise Bullion III M.D   On: 08/04/2016 11:49   Dg Chest 2 View  Result Date: 08/03/2016 CLINICAL DATA:  Fall while walking.  Initial encounter. EXAM: CHEST  2 VIEW COMPARISON:  03/02/2010 FINDINGS: Stable mild to moderate cardiomegaly.  Aortic atherosclerosis. Pulmonary hyperinflation again seen, consistent with COPD. No evidence of pulmonary infiltrate or edema. No evidence of pneumothorax or pleural effusion. Several old left upper rib fracture deformities again noted. IMPRESSION: Stable cardiomegaly and COPD.  No acute findings. Electronically Signed   By: Earle Gell M.D.   On: 08/03/2016 14:31   Dg Pelvis 1-2 Views  Result Date: 08/04/2016 CLINICAL DATA:  Recent fall. EXAM: PELVIS - 1-2 VIEW COMPARISON:  None. FINDINGS: The patient is status post right hip replacement. The femoral component is not completely visualized. Within visualize limits, the hardware is  in good position.  No acute fractures are seen. Degenerative changes seen in the lower lumbar spine. IMPRESSION: No acute abnormalities. Electronically Signed   By: Dorise Bullion III M.D   On: 08/04/2016 11:48   Ct Head Wo Contrast  Result Date: 08/04/2016 CLINICAL DATA:  Confusion.  Fall yesterday. EXAM: CT HEAD WITHOUT CONTRAST CT CERVICAL SPINE WITHOUT CONTRAST TECHNIQUE: Multidetector CT imaging of the head and cervical spine was performed following the standard protocol without intravenous contrast. Multiplanar CT image reconstructions of the cervical spine were also generated. COMPARISON:  08/03/2016 FINDINGS: CT HEAD FINDINGS Brain: There is atrophy and chronic small vessel disease changes. No acute intracranial abnormality. Specifically, no hemorrhage, hydrocephalus, mass lesion, acute infarction, or significant intracranial injury. Vascular: No hyperdense vessel or unexpected calcification. Skull: No acute calvarial abnormality. Sinuses/Orbits: Visualized paranasal sinuses and mastoids clear. Orbital soft tissues unremarkable. Other: None CT CERVICAL SPINE FINDINGS Alignment: Normal alignment. Skull base and vertebrae: No acute fracture. No primary bone lesion or focal pathologic process. Soft tissues and spinal canal: No prevertebral fluid or swelling. No visible canal hematoma. Disc levels: Diffuse disc space narrowing and spurring. Degenerative facet disease bilaterally. Upper chest: Negative Other: Calcified and noncalcified thyroid nodules noted. IMPRESSION: No acute intracranial abnormality.Atrophy, chronic microvascular disease. Degenerative disc and facet disease throughout the cervical spine. No acute bony abnormality. Electronically Signed   By: Rolm Baptise M.D.   On: 08/04/2016 11:32   Ct Head Wo Contrast  Result Date: 08/03/2016 CLINICAL DATA:  Status post fall. EXAM: CT HEAD WITHOUT CONTRAST TECHNIQUE: Contiguous axial images were obtained from the base of the skull through the vertex  without intravenous contrast. COMPARISON:  None. FINDINGS: Brain: No evidence of acute infarction, hemorrhage, extra-axial collection, ventriculomegaly, or mass effect. Generalized cerebral atrophy. Periventricular white matter low attenuation likely secondary to microangiopathy. Vascular: Cerebrovascular atherosclerotic calcifications are noted. Skull: Negative for fracture or focal lesion. Sinuses/Orbits: Visualized portions of the orbits are unremarkable. Visualized portions of the paranasal sinuses and mastoid air cells are unremarkable. Other: None. IMPRESSION: 1. No acute intracranial pathology. 2. Chronic microvascular disease and cerebral atrophy. Electronically Signed   By: Kathreen Devoid   On: 08/03/2016 13:25   Ct Cervical Spine Wo Contrast  Result Date: 08/04/2016 CLINICAL DATA:  Confusion.  Fall yesterday. EXAM: CT HEAD WITHOUT CONTRAST CT CERVICAL SPINE WITHOUT CONTRAST TECHNIQUE: Multidetector CT imaging of the head and cervical spine was performed following the standard protocol without intravenous contrast. Multiplanar CT image reconstructions of the cervical spine were also generated. COMPARISON:  08/03/2016 FINDINGS: CT HEAD FINDINGS Brain: There is atrophy and chronic small vessel disease changes. No acute intracranial abnormality. Specifically, no hemorrhage, hydrocephalus, mass lesion, acute infarction, or significant intracranial injury. Vascular: No hyperdense vessel or unexpected calcification. Skull: No acute calvarial abnormality. Sinuses/Orbits: Visualized paranasal sinuses and mastoids clear. Orbital soft tissues unremarkable. Other: None CT CERVICAL SPINE FINDINGS Alignment: Normal alignment. Skull base and vertebrae: No acute fracture. No primary bone lesion or focal pathologic process. Soft tissues and spinal canal: No prevertebral fluid or swelling. No visible canal hematoma. Disc levels: Diffuse disc space narrowing and spurring. Degenerative facet disease bilaterally. Upper  chest: Negative Other: Calcified and noncalcified thyroid nodules noted. IMPRESSION: No acute intracranial abnormality.Atrophy, chronic microvascular disease. Degenerative disc and facet disease throughout the cervical spine. No acute bony abnormality. Electronically Signed   By: Rolm Baptise M.D.   On: 08/04/2016 11:32   Dg Hand Complete Left  Result Date: 08/03/2016 CLINICAL DATA:  [place] getting out of car, landing on  LEFT hand. Walks with cane. EXAM: LEFT HAND - COMPLETE 3+ VIEW COMPARISON:  None. FINDINGS: No acute fracture deformity dislocation. Severe first carpometacarpal joint space narrowing, periarticular sclerosis and marginal spurring compatible with osteoarthrosis. Mild to moderate interphalangeal joint osteoarthrosis. Osteopenia without destructive bony lesions. Soft tissue planes are nonsuspicious. IMPRESSION: No acute fracture deformity or dislocation. Please note, osteopenia decreases sensitivity for acute nondisplaced fractures. Degenerative change including severe first carpometacarpal osteoarthrosis. Electronically Signed   By: Elon Alas M.D.   On: 08/03/2016 14:38    EKG:   Orders placed or performed during the hospital encounter of 08/04/16  . ED EKG  . ED EKG    ASSESSMENT AND PLAN:   83 year old female with past medical history significant for chronic atrial fibrillation on Protonix, hypertension, hyperlipidemia and minimal cognitive deficit at baseline presents to hospital secondary to change in mental status.  #1 acute encephalopathy-unknown reason at this time. Very delirious. -At baseline patient lives alone, still handles her financial matters. She had a fall-unknown reason 2 days ago presented to the emergency room, CT of the head that time did not show any acute abnormalities. Was discharged home. -noted to be very confused next Morning and brought in. No focal deficits noted at this time. Urine analysis negative for infection, chest x-ray is negative for  infection. -No fevers noted. Empirically on antibiotics with vancomycin and cefepime. -Neurology consulted. MRI of the brain ordered. -On Haldol as needed for agitation  #2 chronic atrial fibrillation-continue digoxin and metoprolol. Hold Protonix, in case she needs any procedures  #3 hypertension-on metoprolol and felodipine  #4 Hypothyroidism- on armour thyroid  #5 DVT prophylaxis- lovenox   All the records are reviewed and case discussed with Care Management/Social Workerr. Management plans discussed with the patient, family and they are in agreement.  CODE STATUS: DNR  TOTAL TIME TAKING CARE OF THIS PATIENT: 37 minutes.   POSSIBLE D/C IN 2 DAYS, DEPENDING ON CLINICAL CONDITION.   Gladstone Lighter M.D on 08/05/2016 at 9:45 AM  Between 7am to 6pm - Pager - [phone]  After 6pm go to [email or web address] - password [email or web address] [facility]  Office  [phone]  CC: Primary care physician; Crecencio Mc, MD

## 2016-08-05 NOTE — Consult Note (Signed)
Referring Physician: Tressia Miners    Chief Complaint: Altered mental status  HPI: Donna Rosario is an 83 y.o. female with a history of atrial fibrillation on Pradaxa who presented on 2/9 after a fall when trying to deliver a Valentine's Day present.  Unclear circumstances around the fall.  Patient was seen in the ED and sent home.  The next day the patient was found confused in her apartment after not answering her AM call. Daughter reports that on yesterday she was speaking nonsense but is better today.  Still not back to baseline.    Date last known well: Unable to determine Time last known well: Unable to determine tPA Given: No: Unable to determine LKW  Past Medical History:  Diagnosis Date  . Arthritis   . Atrial fibrillation [facility]) Sept. 2011   [facility] Admission for RVR  . Chronic low back pain    due to lumbosacral degenerative joint disease  . Disturbance of skin sensation   . Hyperlipidemia   . Hypertension   . Mitral valve disorders(424.0)   . Osteoporosis   . Sciatica   . Thyroid disease    Hypothyroidism  . Vascular dementia, uncomplicated     Past Surgical History:  Procedure Laterality Date  . JOINT REPLACEMENT     Right Hip Oct 2011,  Left Knee 2008 ([facility])  . KNEE SURGERY     Left knee  . THYROIDECTOMY    . TOTAL HIP ARTHROPLASTY  2011    Family History  Problem Relation Age of Onset  . Other Mother     CHF  . Osteoporosis Mother   . Diabetes Mother   . Other Father     unknown  . Osteoporosis Father   . Heart disease Father    Social History:  reports that she has never smoked. She has never used smokeless tobacco. She reports that she drinks about 3.0 oz of alcohol per week . She reports that she does not use drugs.  Allergies:  Allergies  Allergen Reactions  . Sulfonamide Derivatives     Medications:  I have reviewed the patient's current medications. Prior to Admission:  Prescriptions Prior to Admission  Medication Sig Dispense Refill Last  Dose  . alendronate (FOSAMAX) 70 MG tablet Take 1 tablet (70 mg total) by mouth once a week. Take with a full glass of water on an empty stomach. 12 tablet 0 08/03/2016 at Unknown time  . digoxin (LANOXIN) 0.125 MG tablet TAKE 1 TABLET EVERY DAY 90 tablet 0 08/03/2016 at 0800  . ergocalciferol (DRISDOL) 50000 UNITS capsule Take 1 capsule (50,000 Units total) by mouth once a week. 4 capsule 2 Unknown at Unknown  . felodipine (PLENDIL) 2.5 MG 24 hr tablet TAKE 1 TABLET BY MOUTH EVERY DAY 90 tablet 3 08/03/2016 at 0800  . metoprolol (LOPRESSOR) 50 MG tablet Take 1 tablet (50 mg total) by mouth 2 (two) times daily. 180 tablet 3 08/03/2016 at 2000  . acyclovir (ZOVIRAX) 400 MG tablet Take 1 tablet (400 mg total) by mouth 5 (five) times daily. (Patient not taking: Reported on 08/04/2016) 35 tablet 0 Completed Course at Unknown time  . ARMOUR THYROID 60 MG tablet TAKE 1 TABLET (60 MG TOTAL) BY MOUTH DAILY. (Patient not taking: Reported on 08/04/2016) 90 tablet 3 Not Taking at Unknown time  . Ascorbic Acid (VITAMIN C) 500 MG tablet Take 500 mg by mouth daily.     Unknown at Unknown  . benzocaine (ANBESOL JR) 10 % mucosal gel Apply to  lips as needed for painful blisters 5.3 g 0 PRN at PRN  . Calcium Carbonate-Vit D-Min (CALCIUM 1200 PO) Take 1 capsule by mouth daily.     Unknown at Unknown  . dabigatran (PRADAXA) 150 MG CAPS capsule Take 1 capsule (150 mg total) by mouth 2 (two) times daily. (Patient not taking: Reported on 08/04/2016) 60 capsule 3 Not Taking at Unknown time  . felodipine (PLENDIL) 2.5 MG 24 hr tablet TAKE 1 TABLET BY MOUTH EVERY DAY 90 tablet 3 Taking  . fish oil-omega-3 fatty acids 1000 MG capsule Take 1 g by mouth daily.     Unknown at Unknown  . L-Lysine 500 MG TABS Take 1 tablet by mouth daily.     Unknown at Unknown  . lidocaine (XYLOCAINE) 2 % jelly Apply 1 application topically as needed. (Patient not taking: Reported on 08/04/2016) 30 mL 0 Completed Course at Unknown time  . potassium chloride  SA (KLOR-CON M20) 20 MEQ tablet Take 1 tablet (20 mEq total) by mouth daily. (Patient not taking: Reported on 08/04/2016) 90 tablet 3 Not Taking at Unknown time  . Selenium 200 MCG TABS Take 1 tablet by mouth daily.     Unknown at Unknown  . traZODone (DESYREL) 50 MG tablet TAKE 1.5 TABLETS (75 MG TOTAL) BY MOUTH AT BEDTIME. (Patient not taking: Reported on 08/04/2016) 135 tablet 1 Not Taking at Unknown time  . triamcinolone ointment (KENALOG) 0.5 % APPLY TOPICALLY 2 TIMES DAILY TO leg rash  UNTIL RESOLVED (Patient not taking: Reported on 08/04/2016) 30 g 1 Completed Course at Unknown time  . triamcinolone ointment (KENALOG) 0.5 % APPLY TOPICALLY 2 TIMES DAILY TO FACIAL RASH UNTIL RESOLVED (Patient not taking: Reported on 08/04/2016) 30 g 1 Completed Course at Unknown time   Scheduled: . acyclovir  400 mg Oral 5 X Daily  . ceFEPime (MAXIPIME) IV  2 g Intravenous Q12H  . digoxin  125 mcg Oral Daily  . enoxaparin (LOVENOX) injection  40 mg Subcutaneous Q24H  . felodipine  2.5 mg Oral Daily  . metoprolol  50 mg Oral BID  . thyroid  60 mg Oral QAC breakfast  . triamcinolone ointment  1 application Topical TID  . vancomycin  750 mg Intravenous Q24H    ROS: History obtained from the patient  General ROS: negative for - chills, fatigue, fever, night sweats, weight gain or weight loss Psychological ROS: negative for - behavioral disorder, hallucinations, memory difficulties, mood swings or suicidal ideation Ophthalmic ROS: negative for - blurry vision, double vision, eye pain or loss of vision ENT ROS: negative for - epistaxis, nasal discharge, oral lesions, sore throat, tinnitus or vertigo Allergy and Immunology ROS: negative for - hives or itchy/watery eyes Hematological and Lymphatic ROS: negative for - bleeding problems, bruising or swollen lymph nodes Endocrine ROS: negative for - galactorrhea, hair pattern changes, polydipsia/polyuria or temperature intolerance Respiratory ROS: negative for -  cough, hemoptysis, shortness of breath or wheezing Cardiovascular ROS: negative for - chest pain, dyspnea on exertion, edema or irregular heartbeat Gastrointestinal ROS: negative for - abdominal pain, diarrhea, hematemesis, nausea/vomiting or stool incontinence Genito-Urinary ROS: negative for - dysuria, hematuria, incontinence or urinary frequency/urgency Musculoskeletal ROS: negative for - joint swelling or muscular weakness Neurological ROS: as noted in HPI Dermatological ROS: negative for rash and skin lesion changes  Physical Examination: Blood pressure (!) 142/62, pulse 89, temperature 97.1 F (36.2 C), temperature source Axillary, resp. rate 20, height 5\' 3"  (1.6 m), weight 56.7 kg (125 lb), SpO2 100 %.  HEENT-  Normocephalic, no lesions, without obvious abnormality.  Normal external eye and conjunctiva.  Normal TM's bilaterally.  Normal auditory canals and external ears. Normal external nose, mucus membranes and septum.  Normal pharynx. Cardiovascular- S1, S2 normal, pulses palpable throughout   Lungs- chest clear, no wheezing, rales, normal symmetric air entry Abdomen- soft, non-tender; bowel sounds normal; no masses,  no organomegaly Extremities- no edema Lymph-no adenopathy palpable Musculoskeletal-no joint tenderness, deformity or swelling Skin-warm and dry, no hyperpigmentation, vitiligo, or suspicious lesions  Neurological Examination   Mental Status: Sleeping when I enter the room but easily awakened.  Some confusion as to where she is initially but easily reoriented.  Speech fluent but slurred.  Able to follow 3 step commands but requires extensive reinforcement and has more difficulty with the right. Cranial Nerves: II: Discs flat bilaterally; Does not appreciate stimuli in the right visual field.  Pupils equal, round, reactive to light and accommodation III,IV, VI: ptosis not present, extra-ocular motions intact bilaterally V,VII: smile symmetric, facial light touch  sensation normal bilaterally VIII: hearing normal bilaterally IX,X: gag reflex present XI: bilateral shoulder shrug XII: midline tongue extension Motor: Right : Upper extremity   5-/5    Left:     Upper extremity   5/5  Lower extremity   4+/5    Lower extremity   5-/5 Tone and bulk:normal tone throughout; no atrophy noted Sensory: Pinprick and light touch decreased on the right upper and lower extremity Deep Tendon Reflexes: 1+ in the upper extremities and absent in the lower extremities Plantars: Right: mute   Left: mute Cerebellar: No dysmetria noted with finger-to-nose and heel-to-shin testing Gait: not tested due to safety concerns   Laboratory Studies:  Basic Metabolic Panel:  Recent Labs Lab 08/03/16 1426 08/04/16 1104 08/05/16 0404  NA 139 138 137  K 3.8 4.1 3.8  CL 108 106 109  CO2 24 23 21*  GLUCOSE 107* 120* 108*  BUN 19 16 15   CREATININE 0.86 0.82 0.71  CALCIUM 8.9 8.6* 8.1*    Liver Function Tests:  Recent Labs Lab 08/03/16 1426 08/04/16 1104 08/05/16 0404  AST 28 32 29  ALT 17 16 15   ALKPHOS 59 60 53  BILITOT 1.0 1.6* 1.5*  PROT 6.9 6.9 6.5  ALBUMIN 3.8 3.8 3.5    Recent Labs Lab 08/04/16 1104  LIPASE 26    Recent Labs Lab 08/05/16 0929  AMMONIA 9    CBC:  Recent Labs Lab 08/03/16 1426 08/04/16 1104 08/05/16 0404  WBC 11.4* 11.4* 10.9  NEUTROABS  --  8.6*  --   HGB 15.0 14.7 14.2  HCT 46.4 44.9 42.9  MCV 88.3 89.7 89.3  PLT 247 218 208    Cardiac Enzymes:  Recent Labs Lab 08/04/16 1104  CKTOTAL 219  TROPONINI <0.03    BNP: Invalid input(s): POCBNP  CBG: No results for input(s): GLUCAP in the last 168 hours.  Microbiology: Results for orders placed or performed during the hospital encounter of 08/04/16  MRSA PCR Screening     Status: None   Collection Time: 08/04/16  7:08 PM  Result Value Ref Range Status   MRSA by PCR NEGATIVE NEGATIVE Final    Comment:        The GeneXpert MRSA Assay (FDA approved for  NASAL specimens only), is one component of a comprehensive MRSA colonization surveillance program. It is not intended to diagnose MRSA infection nor to guide or monitor treatment for MRSA infections.     Coagulation Studies:  No results for input(s): LABPROT, INR in the last 72 hours.  Urinalysis:  Recent Labs Lab 08/03/16 1426 08/04/16 1159  COLORURINE STRAW* YELLOW*  LABSPEC 1.010 1.016  PHURINE 8.0 8.0  GLUCOSEU NEGATIVE NEGATIVE  HGBUR NEGATIVE NEGATIVE  BILIRUBINUR NEGATIVE NEGATIVE  KETONESUR NEGATIVE 5*  PROTEINUR NEGATIVE NEGATIVE  NITRITE NEGATIVE NEGATIVE  LEUKOCYTESUR TRACE* NEGATIVE    Lipid Panel:    Component Value Date/Time   CHOL 215 (H) 01/12/2014 1400   TRIG 165.0 (H) 01/12/2014 1400   HDL 54.70 01/12/2014 1400   CHOLHDL 4 01/12/2014 1400   VLDL 33.0 01/12/2014 1400   LDLCALC 127 (H) 01/12/2014 1400    HgbA1C: No results found for: HGBA1C  Urine Drug Screen:  No results found for: LABOPIA, COCAINSCRNUR, LABBENZ, AMPHETMU, THCU, LABBARB  Alcohol Level: No results for input(s): ETH in the last 168 hours.   Imaging: Dg Chest 1 View  Result Date: 08/04/2016 CLINICAL DATA:  Recent fall.  Confusion. EXAM: CHEST 1 VIEW COMPARISON:  August 03, 2016 FINDINGS: Healed rib fractures in the upper left chest. No acute bony abnormalities. No pneumothorax. Stable cardiomegaly. The hila and mediastinum are normal. The lungs are unremarkable with no nodules, masses, or focal infiltrates. IMPRESSION: No active disease. Electronically Signed   By: Dorise Bullion III M.D   On: 08/04/2016 11:49   Dg Chest 2 View  Result Date: 08/03/2016 CLINICAL DATA:  Fall while walking.  Initial encounter. EXAM: CHEST  2 VIEW COMPARISON:  03/02/2010 FINDINGS: Stable mild to moderate cardiomegaly.  Aortic atherosclerosis. Pulmonary hyperinflation again seen, consistent with COPD. No evidence of pulmonary infiltrate or edema. No evidence of pneumothorax or pleural effusion. Several  old left upper rib fracture deformities again noted. IMPRESSION: Stable cardiomegaly and COPD.  No acute findings. Electronically Signed   By: Earle Gell M.D.   On: 08/03/2016 14:31   Dg Pelvis 1-2 Views  Result Date: 08/04/2016 CLINICAL DATA:  Recent fall. EXAM: PELVIS - 1-2 VIEW COMPARISON:  None. FINDINGS: The patient is status post right hip replacement. The femoral component is not completely visualized. Within visualize limits, the hardware is in good position. No acute fractures are seen. Degenerative changes seen in the lower lumbar spine. IMPRESSION: No acute abnormalities. Electronically Signed   By: Dorise Bullion III M.D   On: 08/04/2016 11:48   Ct Head Wo Contrast  Result Date: 08/04/2016 CLINICAL DATA:  Confusion.  Fall yesterday. EXAM: CT HEAD WITHOUT CONTRAST CT CERVICAL SPINE WITHOUT CONTRAST TECHNIQUE: Multidetector CT imaging of the head and cervical spine was performed following the standard protocol without intravenous contrast. Multiplanar CT image reconstructions of the cervical spine were also generated. COMPARISON:  08/03/2016 FINDINGS: CT HEAD FINDINGS Brain: There is atrophy and chronic small vessel disease changes. No acute intracranial abnormality. Specifically, no hemorrhage, hydrocephalus, mass lesion, acute infarction, or significant intracranial injury. Vascular: No hyperdense vessel or unexpected calcification. Skull: No acute calvarial abnormality. Sinuses/Orbits: Visualized paranasal sinuses and mastoids clear. Orbital soft tissues unremarkable. Other: None CT CERVICAL SPINE FINDINGS Alignment: Normal alignment. Skull base and vertebrae: No acute fracture. No primary bone lesion or focal pathologic process. Soft tissues and spinal canal: No prevertebral fluid or swelling. No visible canal hematoma. Disc levels: Diffuse disc space narrowing and spurring. Degenerative facet disease bilaterally. Upper chest: Negative Other: Calcified and noncalcified thyroid nodules noted.  IMPRESSION: No acute intracranial abnormality.Atrophy, chronic microvascular disease. Degenerative disc and facet disease throughout the cervical spine. No acute bony abnormality. Electronically Signed   By: Lennette Bihari  [place] M.D.   On: 08/04/2016 11:32   Ct Head Wo Contrast  Result Date: 08/03/2016 CLINICAL DATA:  Status post fall. EXAM: CT HEAD WITHOUT CONTRAST TECHNIQUE: Contiguous axial images were obtained from the base of the skull through the vertex without intravenous contrast. COMPARISON:  None. FINDINGS: Brain: No evidence of acute infarction, hemorrhage, extra-axial collection, ventriculomegaly, or mass effect. Generalized cerebral atrophy. Periventricular white matter low attenuation likely secondary to microangiopathy. Vascular: Cerebrovascular atherosclerotic calcifications are noted. Skull: Negative for fracture or focal lesion. Sinuses/Orbits: Visualized portions of the orbits are unremarkable. Visualized portions of the paranasal sinuses and mastoid air cells are unremarkable. Other: None. IMPRESSION: 1. No acute intracranial pathology. 2. Chronic microvascular disease and cerebral atrophy. Electronically Signed   By: Kathreen Devoid   On: 08/03/2016 13:25   Ct Cervical Spine Wo Contrast  Result Date: 08/04/2016 CLINICAL DATA:  Confusion.  Fall yesterday. EXAM: CT HEAD WITHOUT CONTRAST CT CERVICAL SPINE WITHOUT CONTRAST TECHNIQUE: Multidetector CT imaging of the head and cervical spine was performed following the standard protocol without intravenous contrast. Multiplanar CT image reconstructions of the cervical spine were also generated. COMPARISON:  08/03/2016 FINDINGS: CT HEAD FINDINGS Brain: There is atrophy and chronic small vessel disease changes. No acute intracranial abnormality. Specifically, no hemorrhage, hydrocephalus, mass lesion, acute infarction, or significant intracranial injury. Vascular: No hyperdense vessel or unexpected calcification. Skull: No acute calvarial abnormality.  Sinuses/Orbits: Visualized paranasal sinuses and mastoids clear. Orbital soft tissues unremarkable. Other: None CT CERVICAL SPINE FINDINGS Alignment: Normal alignment. Skull base and vertebrae: No acute fracture. No primary bone lesion or focal pathologic process. Soft tissues and spinal canal: No prevertebral fluid or swelling. No visible canal hematoma. Disc levels: Diffuse disc space narrowing and spurring. Degenerative facet disease bilaterally. Upper chest: Negative Other: Calcified and noncalcified thyroid nodules noted. IMPRESSION: No acute intracranial abnormality.Atrophy, chronic microvascular disease. Degenerative disc and facet disease throughout the cervical spine. No acute bony abnormality. Electronically Signed   By: Rolm Baptise M.D.   On: 08/04/2016 11:32   Dg Hand Complete Left  Result Date: 08/03/2016 CLINICAL DATA:  [place] [address] getting out of car, landing on LEFT hand. Walks with cane. EXAM: LEFT HAND - COMPLETE 3+ VIEW COMPARISON:  None. FINDINGS: No acute fracture deformity dislocation. Severe first carpometacarpal joint space narrowing, periarticular sclerosis and marginal spurring compatible with osteoarthrosis. Mild to moderate interphalangeal joint osteoarthrosis. Osteopenia without destructive bony lesions. Soft tissue planes are nonsuspicious. IMPRESSION: No acute fracture deformity or dislocation. Please note, osteopenia decreases sensitivity for acute nondisplaced fractures. Degenerative change including severe first carpometacarpal osteoarthrosis. Electronically Signed   By: Elon Alas M.D.   On: 08/03/2016 14:38    Assessment: 83 y.o. female with a history of atrial fibrillation and dementia presenting with an altered mental status after a fall.  Although post-concussive syndrome is on the differential, patient with focal findings on neurological examination suggesting that she may have had an ischemic or shower of ischemic events.  Patient unable to take po so is off Pradaxa.   Head CT reviewed and shows no acute changes.  Seizure is on the differential as well.    Stroke Risk Factors - atrial fibrillation, hyperlipidemia and hypertension  Plan: 1. HgbA1c, fasting lipid panel 2. MRI, MRA  of the brain without contrast 3. PT consult, OT consult, Speech consult 4. Echocardiogram 5. Carotid dopplers 6. Prophylactic therapy-Antiplatelet med: Aspirin - dose 300mg  rectally 7. NPO until RN stroke swallow screen 8. Telemetry monitoring 9. Frequent neuro checks 10. EEG  Alexis Goodell, MD Neurology [phone] 08/05/2016, 12:23 PM

## 2016-08-05 NOTE — Clinical Social Work Note (Signed)
Clinical Social Work Assessment  Patient Details  Name: Donna Rosario MRN: [id number] Date of Birth: Aug 30, 1922  Date of referral:  08/05/16               Reason for consult:  Facility Placement                Permission sought to share information with:  Facility Art therapist granted to share information::  Yes, Verbal Permission Granted  Name::        Agency::     Relationship::     Contact Information:     Housing/Transportation Living arrangements for the past 2 months:  [place] of Information:  Adult Children Patient Interpreter Needed:  None Criminal Activity/Legal Involvement Pertinent to Current Situation/Hospitalization:  No - Comment as needed Significant Relationships:  Adult Children, Community Support Lives with:  Self Do you feel safe going back to the place where you live?  Yes Need for family participation in patient care:  Yes (Comment)  Care giving concerns:  Patient may require placement    Social Worker assessment / plan:  CSW met with patient and her daughter at bedside to discuss discharge planning. The patient lives at [place], and her daughter reports that if SNF is recommended, the patient would prefer [facility]. The patient's daughter gave verbal permission for the referral.  At baseline, the patient has some difficulty with short term memory; however, the patient's daughter indicates that her short term memory has declined sharply since the fall. The patient typically ambulates independently, and she is able to dress, bathe and eat independently at baseline. PT has not seen the patient at this time, and the patient is slated for an MRI and additional tests.       Employment status:  Retired Forensic scientist:  Medicare PT Recommendations:  Not assessed at this time Information / Referral to community resources:  [place]  Patient/Family's Response to care:   Patient and her daughter thanked CSW.  Patient/Family's Understanding of and Emotional Response to Diagnosis, Current Treatment, and Prognosis:  Patient's daughter is very involved in her mother's care.  Emotional Assessment Appearance:  Appears stated age Attitude/Demeanor/Rapport:  Lethargic Affect (typically observed):  Apprehensive, Constricted, Pleasant Orientation:  Oriented to Self, Oriented to Place Alcohol / Substance use:  Never Used Psych involvement (Current and /or in the community):  No (Comment)  Discharge Needs  Concerns to be addressed:  Care Coordination Readmission within the last 30 days:  No Current discharge risk:  None Barriers to Discharge:  Continued Medical Work up   [employer], LCSW 08/05/2016, 12:07 PM

## 2016-08-05 NOTE — Clinical Social Work Placement (Signed)
   CLINICAL SOCIAL WORK PLACEMENT  NOTE  Date:  08/05/2016  Patient Details  Name: Donna Rosario MRN: [id number] Date of Birth: [birth year]  Clinical Social Work is seeking post-discharge placement for this patient at the [place] level of care (*CSW will initial, date and re-position this form in  chart as items are completed):  Yes   Patient/family provided with [facility] Work Department's list of facilities offering this level of care within the geographic area requested by the patient (or if unable, by the patient's family).  Yes   Patient/family informed of their freedom to choose among providers that offer the needed level of care, that participate in Medicare, Medicaid or managed care program needed by the patient, have an available bed and are willing to accept the patient.  Yes   Patient/family informed of [facility]'s ownership interest in [facility] and [facility], as well as of the fact that they are under no obligation to receive care at these facilities.  PASRR submitted to EDS on       PASRR number received on       Existing PASRR number confirmed on 08/05/16     FL2 transmitted to all facilities in geographic area requested by pt/family on 08/05/16     FL2 transmitted to all facilities within larger geographic area on       Patient informed that his/her managed care company has contracts with or will negotiate with certain facilities, including the following:            Patient/family informed of bed offers received.  Patient chooses bed at       Physician recommends and patient chooses bed at      Patient to be transferred to   on  .  Patient to be transferred to facility by       Patient family notified on   of transfer.  Name of family member notified:        PHYSICIAN       Additional Comment:    _______________________________________________ Zettie Pho, LCSW 08/05/2016, 1:10 PM

## 2016-08-05 NOTE — Progress Notes (Signed)
Patient due to void, bladder scan showed 436ml.  Prime doc paged.

## 2016-08-05 NOTE — NC FL2 (Signed)
[place] LEVEL OF CARE SCREENING TOOL     IDENTIFICATION  Patient Name: Donna Rosario Birthdate: August 12, 1922 Sex: female Admission Date (Current Location): 08/04/2016  [place] and [place] Number:  Engineering geologist and Address:  [facility], [address], [place], [place] [address]      Provider Number: [id number]  Attending Physician Name and Address:  Gladstone Lighter, MD  Relative Name and Phone Number:       Current Level of Care: Hospital Recommended Level of Care: [place] Prior Approval Number:    Date Approved/Denied:   PASRR Number: [id number]  Discharge Plan: SNF    Current Diagnoses: Patient Active Problem List   Diagnosis Date Noted  . Encephalopathy acute 08/04/2016  . Herpes zoster infection of oral mucosa 06/05/2016  . Erroneous encounter - disregard 07/25/2015  . Vitamin D deficiency 06/01/2015  . Hypothyroidism 05/30/2015  . Acute right hip pain 06/10/2014  . Basal cell carcinoma of temple region 12/15/2013  . Chronic diastolic CHF (congestive heart failure) ([place]) 08/10/2013  . Chest pain 02/11/2013  . Pityriasis rosea-like drug eruption 09/26/2012  . Allergic urticaria 09/23/2012  . Dizziness 04/09/2012  . Insomnia 10/31/2011  . Venous insufficiency of leg 03/11/2011  . Dementia 03/09/2011  . DIASTOLIC DYSFUNCTION [id number]  . HYPERTENSION, BENIGN 03/30/2010  . ATRIAL FIBRILLATION 03/30/2010  . Edema 03/30/2010    Orientation RESPIRATION BLADDER Height & Weight     Self, Place  Normal Continent Weight: 125 lb (56.7 kg) Height:  5\' 3"  (160 cm)  BEHAVIORAL SYMPTOMS/MOOD NEUROLOGICAL BOWEL NUTRITION STATUS      Continent Diet (HTN diet)  AMBULATORY STATUS COMMUNICATION OF NEEDS Skin   Supervision Verbally Bruising, Skin abrasions (Significant bruising and abrasion on chin, bruising on both wrists, bruising on trunk and legs.)                       Personal Care Assistance  Level of Assistance  Bathing, Feeding, Dressing Bathing Assistance: Limited assistance Feeding assistance: Independent Dressing Assistance: Limited assistance     Functional Limitations Info             SPECIAL CARE FACTORS FREQUENCY  PT (By licensed PT), OT (By licensed OT)     PT Frequency: Up to 5 X per day, 5 days per week OT Frequency: Up to 5 X per day, 5 days per week            Contractures Contractures Info: Present    Additional Factors Info  Code Status, Allergies Code Status Info: DNR Allergies Info: Sulfonamide Derivatives           Current Medications (08/05/2016):  This is the current hospital active medication list Current Facility-Administered Medications  Medication Dose Route Frequency Provider Last Rate Last Dose  . 0.9 %  sodium chloride infusion   Intravenous Continuous Gladstone Lighter, MD 75 mL/hr at 08/05/16 0915    . acetaminophen (TYLENOL) tablet 650 mg  650 mg Oral Q6H PRN Nicholes Mango, MD       Or  . acetaminophen (TYLENOL) suppository 650 mg  650 mg Rectal Q6H PRN Nicholes Mango, MD      . acyclovir (ZOVIRAX) 200 MG capsule 400 mg  400 mg Oral 5 X Daily Nicholes Mango, MD   400 mg at 08/05/16 0914  . aspirin suppository 300 mg  300 mg Rectal Daily Alexis Goodell, MD      . ceFEPIme (MAXIPIME) 2 g in dextrose  5 % 50 mL IVPB  2 g Intravenous Q12H Nicholes Mango, MD   2 g at 08/05/16 0915  . digoxin (LANOXIN) tablet 125 mcg  125 mcg Oral Daily Nicholes Mango, MD   125 mcg at 08/05/16 0914  . enoxaparin (LOVENOX) injection 40 mg  40 mg Subcutaneous Q24H Gladstone Lighter, MD      . felodipine (PLENDIL) 24 hr tablet 2.5 mg  2.5 mg Oral Daily Gladstone Lighter, MD   2.5 mg at 08/05/16 1132  . haloperidol lactate (HALDOL) injection 0.5 mg  0.5 mg Intravenous Q6H PRN Gladstone Lighter, MD      . labetalol (NORMODYNE,TRANDATE) injection 10 mg  10 mg Intravenous Q2H PRN Lance Coon, MD   10 mg at 08/05/16 0129  . LORazepam (ATIVAN) injection 1 mg  1 mg  Intravenous Q6H PRN Gladstone Lighter, MD      . metoprolol (LOPRESSOR) tablet 50 mg  50 mg Oral BID Nicholes Mango, MD   50 mg at 08/05/16 0915  . ondansetron (ZOFRAN) tablet 4 mg  4 mg Oral Q6H PRN Nicholes Mango, MD       Or  . ondansetron (ZOFRAN) injection 4 mg  4 mg Intravenous Q6H PRN Nicholes Mango, MD      . thyroid (ARMOUR) tablet 60 mg  60 mg Oral QAC breakfast Nicholes Mango, MD   60 mg at 08/05/16 0914  . triamcinolone ointment (KENALOG) 0.5 % 1 application  1 application Topical TID Nicholes Mango, MD   1 application at [id number]  . vancomycin (VANCOCIN) IVPB 750 mg/150 ml premix  750 mg Intravenous Q24H Nicholes Mango, MD   750 mg at 08/05/16 0416     Discharge Medications: Please see discharge summary for a list of discharge medications.  Relevant Imaging Results:  Relevant Lab Results:   Additional Information SS# [id number]  Jery Hollern M Cresencio Reesor, LCSW

## 2016-08-05 NOTE — Progress Notes (Signed)
Patient was able to swallow water without difficulty.  How patient only took a couple medications by mouth and spit out refusing the rest. See Mar

## 2016-08-05 NOTE — Progress Notes (Signed)
MD Kalisetti notified of pts MRI results. No new orders at this time.

## 2016-08-05 NOTE — Progress Notes (Signed)
Patient is confused and had pulled out IV, new iv inserted.

## 2016-08-06 ENCOUNTER — Inpatient Hospital Stay: Payer: Medicare Other

## 2016-08-06 ENCOUNTER — Inpatient Hospital Stay (HOSPITAL_COMMUNITY)
Admit: 2016-08-06 | Discharge: 2016-08-06 | Disposition: A | Discharge status: Home / Self Care | Payer: Medicare Other | Attending: Internal Medicine | Admitting: Internal Medicine

## 2016-08-06 DIAGNOSIS — G9340 Encephalopathy, unspecified: Secondary | ICD-10-CM

## 2016-08-06 DIAGNOSIS — I639 Cerebral infarction, unspecified: Secondary | ICD-10-CM

## 2016-08-06 LAB — URINE CULTURE: Culture: NO GROWTH

## 2016-08-06 LAB — ECHOCARDIOGRAM COMPLETE
Height: 63 in
WEIGHTICAEL: 2000 [oz_av]

## 2016-08-06 MED ORDER — ATORVASTATIN CALCIUM 20 MG PO TABS
40.0000 mg | ORAL_TABLET | Freq: Every day | ORAL | Status: DC
  Administered 2016-08-06: 40 mg via ORAL
  Filled 2016-08-06: qty 2

## 2016-08-06 MED ORDER — DABIGATRAN ETEXILATE MESYLATE 150 MG PO CAPS
150.0000 mg | ORAL_CAPSULE | Freq: Two times a day (BID) | ORAL | Status: DC
  Administered 2016-08-06 – 2016-08-07 (×3): 150 mg via ORAL
  Filled 2016-08-06 (×3): qty 1

## 2016-08-06 NOTE — Progress Notes (Signed)
[place] [facility] [place] NAME: Donna Rosario    MR#:  [id number]  DATE OF BIRTH:  12/12/1922  SUBJECTIVE:  CHIEF COMPLAINT:   Chief Complaint  Patient presents with  . Altered Mental Status   - speech and mentation are better- Not back to baseline. -Having some expressive aphasia  REVIEW OF SYSTEMS:  Review of Systems  Constitutional: Negative for chills, fever and malaise/fatigue.  HENT: Positive for hearing loss. Negative for congestion, ear discharge and nosebleeds.   Eyes: Negative for blurred vision and double vision.  Respiratory: Negative for cough, shortness of breath and wheezing.   Cardiovascular: Negative for chest pain, palpitations and leg swelling.  Gastrointestinal: Negative for abdominal pain, constipation, diarrhea, nausea and vomiting.  Genitourinary: Negative for dysuria.  Musculoskeletal: Negative for myalgias and neck pain.  Neurological: Positive for speech change. Negative for dizziness, sensory change, focal weakness, seizures and headaches.    DRUG ALLERGIES:   Allergies  Allergen Reactions  . Sulfonamide Derivatives     VITALS:  Blood pressure (!) 167/81, pulse (!) 117, temperature 98 F (36.7 C), resp. rate 18, height 5\' 3"  (1.6 m), weight 56.7 kg (125 lb), SpO2 95 %.  PHYSICAL EXAMINATION:  Physical Exam  GENERAL:  83 y.o.-year-old patient lying in the bed with no acute distress. EYES: Pupils equal, round, reactive to light and accommodation. No scleral icterus. Extraocular muscles intact.  HEENT: Head atraumatic, normocephalic. Oropharynx and nasopharynx clear.  NECK:  Supple, no jugular venous distention. No thyroid enlargement, no tenderness.  LUNGS: Normal breath sounds bilaterally, no wheezing, rales,rhonchi or crepitation. No use of accessory muscles of respiration. Decreased bibasilar breath sounds CARDIOVASCULAR: S1, S2 normal. No rubs, or gallops. 2/6 systolic murmur is present ABDOMEN: Soft,  nontender, nondistended. Bowel sounds present. No organomegaly or mass.  EXTREMITIES: No pedal edema, cyanosis, or clubbing.  NEUROLOGIC: Follows simple commands. Some expressive aphasia noted. Strength is 5/5 all 4 extremities. Sensation intact. Gait not checked.  PSYCHIATRIC: The patient is alert and oriented 2. Follow simple commands. Repeating same questions occasionally SKIN: No obvious rash, lesion, or ulcer.    LABORATORY PANEL:   CBC  Recent Labs Lab 08/05/16 0404  WBC 10.9  HGB 14.2  HCT 42.9  PLT 208   ------------------------------------------------------------------------------------------------------------------  Chemistries   Recent Labs Lab 08/05/16 0404  NA 137  K 3.8  CL 109  CO2 21*  GLUCOSE 108*  BUN 15  CREATININE 0.71  CALCIUM 8.1*  AST 29  ALT 15  ALKPHOS 53  BILITOT 1.5*   ------------------------------------------------------------------------------------------------------------------  Cardiac Enzymes  Recent Labs Lab 08/04/16 1104  TROPONINI <0.03   ------------------------------------------------------------------------------------------------------------------  RADIOLOGY:  Mr Brain Wo Contrast  Result Date: 08/05/2016 CLINICAL DATA:  Recent fall with trauma to the head. EXAM: MRI HEAD WITHOUT CONTRAST TECHNIQUE: Multiplanar, multiecho pulse sequences of the brain and surrounding structures were obtained without intravenous contrast. COMPARISON:  CT studies 08/03/2016 and 08/04/2016. 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 No large confluent infarction. No other acute infarction. Elsewhere, the brain shows chronic small-vessel ischemic changes of the hemispheric white matter. No sign of hemorrhage based on these limited sequences. No hydrocephalus. No extra-axial collection. Vascular: Not accurately evaluated. Skull  and upper cervical spine: Negative Sinuses/Orbits: Not accurately evaluated. 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 No evidence of swelling or hemorrhage. Electronically Signed   By: Nelson Chimes M.D.   On: 08/05/2016 14:20   US Carotid Bilateral  Result Date: 08/06/2016 CLINICAL DATA:  CVA. EXAM: BILATERAL CAROTID DUPLEX ULTRASOUND TECHNIQUE: Pearline Cables scale imaging, color Doppler and duplex ultrasound were performed of bilateral carotid and vertebral arteries in the neck. COMPARISON:  MRI 08/05/2016 . FINDINGS: Criteria: Quantification of carotid stenosis is based on velocity parameters that correlate the residual internal carotid diameter with NASCET-based stenosis levels, using the diameter of the distal internal carotid lumen as the denominator for stenosis measurement. The following velocity measurements were obtained: RIGHT ICA:  42/5 cm/sec CCA:  123456 cm/sec SYSTOLIC ICA/CCA RATIO:  0.7 DIASTOLIC ICA/CCA RATIO:  1.1 ECA:  76 cm/sec LEFT ICA:  52/9 cm/sec CCA:  99991111 cm/sec SYSTOLIC ICA/CCA RATIO:  0.9 DIASTOLIC ICA/CCA RATIO:  1.1 ECA:  109 cm/sec RIGHT CAROTID ARTERY: Mild atherosclerotic plaque right carotid bifurcation. No flow limiting stenosis. RIGHT VERTEBRAL ARTERY:  Patent with antegrade flow. LEFT CAROTID ARTERY: Mild atherosclerotic vascular plaque left carotid bifurcation. No flow limiting stenosis. LEFT VERTEBRAL ARTERY:  Patent antegrade flow. Incidental note made of a 2.2 cm nodule with calcification in the left lobe of the thyroid. Dedicated thyroid ultrasound suggested for further evaluation. IMPRESSION: 1. Mild bilateral carotid bifurcation atherosclerotic vascular plaque. No flow limiting stenosis. Degree of stenosis less than 50% bilaterally. 2. 2.2 cm calcified nodule left thyroid lobe. Dedicated thyroid ultrasound suggest for further  evaluation. Electronically Signed   By: Marcello Moores  Register   On: 08/06/2016 10:34    EKG:   Orders placed or performed during the hospital encounter of 08/04/16  . ED EKG  . ED EKG    ASSESSMENT AND PLAN:   83 year old female with past medical history significant for chronic atrial fibrillation on Protonix, hypertension, hyperlipidemia and minimal cognitive deficit at baseline presents to hospital secondary to change in mental status.  #1 acute encephalopathy- secondary to shower of embolic infarcts - MRI with left parietal infarct, MRA with no significant stenosis, carotid dopplers and ECHO pending - known h/o afib- pradaxa restarted. Add statin - Also EEG ordered to r/o any seizures leading to initial fall on 08/03/16 -At baseline patient lives alone, still handles her financial matters. Urine analysis negative for infection, chest x-ray is negative for infection. -No fevers noted. Discontinue antibiotics vancomycin and cefepime. -Neurology consulted.   #2 chronic atrial fibrillation-on digoxin and metoprolol. Restarted pradaxa today  #3 hypertension-on metoprolol and felodipine  #4 Hypothyroidism- on armour thyroid  #5 DVT prophylaxis- lovenox  PT/OT consults are pending Discharge to rehab tomorrow   All the records are reviewed and case discussed with Care Management/Social Workerr. Management plans discussed with the patient, family and they are in agreement.  CODE STATUS: DNR  TOTAL TIME TAKING CARE OF THIS PATIENT: 37 minutes.   POSSIBLE D/C TOMORROW, DEPENDING ON CLINICAL CONDITION.   Gladstone Lighter M.D on 08/06/2016 at 1:15 PM  Between 7am to 6pm - Pager - [phone]  After 6pm go to [email or web address] - password [email or web address] [place] [facility]  Office  [phone]  CC: Primary care physician; Crecencio Mc, MD

## 2016-08-06 NOTE — Progress Notes (Signed)
*  PRELIMINARY RESULTS* Echocardiogram 2D Echocardiogram has been performed.  Sherrie Sport 08/06/2016, 9:27 AM

## 2016-08-06 NOTE — Progress Notes (Signed)
OT Cancellation Note  Patient Details Name: Donna Rosario MRN: [id number] DOB: 06/03/1923   Cancelled Treatment:    Reason Eval/Treat Not Completed: Patient at procedure or test/ unavailable.  Order received and chart reviewed.  Spoke to [place] from [place] about status and she is currently at testing for next 30 minutes.  Will attempt again later today or tomorrow.  Thank you for the referral.  Chrys Racer, OTR/L ascom [id number] 08/06/16, 2:14 PM   =

## 2016-08-06 NOTE — Progress Notes (Signed)
Plan is for patient to D/C to [facility] tomorrow pending medical clearance. [facility] admissions coordinator at [facility] is aware of above. Patient's daughter Meredith Mody is also aware of above. Clinical Social Worker (CSW) will continue to follow and assist as needed.   [employer], LCSW [phone]

## 2016-08-06 NOTE — Evaluation (Signed)
Speech Language Pathology Evaluation Patient Details Name: Donna Rosario MRN: [id number] DOB: [birth year] Today's Date: 08/06/2016 Time: 1240-1340 SLP Time Calculation (min) (ACUTE ONLY): 60 min  Problem List:  Patient Active Problem List   Diagnosis Date Noted  . Encephalopathy acute 08/04/2016  . Herpes zoster infection of oral mucosa 06/05/2016  . Erroneous encounter - disregard 07/25/2015  . Vitamin D deficiency 06/01/2015  . Hypothyroidism 05/30/2015  . Acute right hip pain 06/10/2014  . Basal cell carcinoma of temple region 12/15/2013  . Chronic diastolic CHF (congestive heart failure) ([place]) 08/10/2013  . Chest pain 02/11/2013  . Pityriasis rosea-like drug eruption 09/26/2012  . Allergic urticaria 09/23/2012  . Dizziness 04/09/2012  . Insomnia 10/31/2011  . Venous insufficiency of leg 03/11/2011  . Dementia 03/09/2011  . DIASTOLIC DYSFUNCTION [id number]  . HYPERTENSION, BENIGN 03/30/2010  . ATRIAL FIBRILLATION 03/30/2010  . Edema 03/30/2010   Past Medical History:  Past Medical History:  Diagnosis Date  . Arthritis   . Atrial fibrillation [facility]) Sept. 2011   [facility] Admission for RVR  . Chronic low back pain    due to lumbosacral degenerative joint disease  . Disturbance of skin sensation   . Hyperlipidemia   . Hypertension   . Mitral valve disorders(424.0)   . Osteoporosis   . Sciatica   . Thyroid disease    Hypothyroidism  . Vascular dementia, uncomplicated    Past Surgical History:  Past Surgical History:  Procedure Laterality Date  . JOINT REPLACEMENT     Right Hip Oct 2011,  Left Knee 2008 ([place])  . KNEE SURGERY     Left knee  . THYROIDECTOMY    . TOTAL HIP ARTHROPLASTY  2011   HPI:  Pt is a 83 y.o. female with a known history of Chronic atrial fibrillation on Pradaxa, hyperlipidemia, hypertension, baseline dementia and multiple other medical problems was evaluated in the emergency department yesterday after she sustained a fall and had sutures on the  base of her chin and was discharged. Today patient was found to be more confused by the medical staff and called EMS. Patient is brought into the emergency department. CT head and neck are negative. Hospitalist team is called to admit the patient. Pt is still not oriented X4. Pt's daughter reports pt doing much better since admit date with speech and langauge, but pt not back to baseline.    Assessment / Plan / Recommendation Clinical Impression  Pt appears to present with moderate Cognitive decline with primarily expressive Aphasia. Per pt's chart review pt does have dx of vascular dementia which could also be impacting her expressive langauge abilites during this acute time. Pt given parts of MOCA-B but full eval was not completed d/t pt being taken for EEG test. Pt exhibited difficulty with excutive functioning, immediate recall, naming and orientation. Pt was not able to follow directions to first task of excutive functioning. Pt was able to name 5/8 items without notable difficulty. However, pt was not able to name 3 items despite phonetic cues. Pt responds well with object function and gestures.  Note- pt reports wearing glasses typically and did not have glasses for evaluation. Pt's self awareness of errors was inconsistent throughout testing with naming and conversation. Pt did exhibit perseveration and difficulty processing new information as well as switching to new tasks. Pt's difficulty hearing and see without aids should be addressed for any f/u testing (pt did appear to hear this examiner and daughter in general conversation 1 on 1 with no environmental  stimuli in room). pt would benefit from skilled ST services to target fuctional cognitive-lingustic tasks in everyday ADLs. Daughter and NSG updated.     SLP Assessment  All further Speech Lanaguage Pathology  needs can be addressed in the next venue of care    Follow Up Recommendations   [facility]) Skilled rehab 5x/week TBD   Frequency  and Duration  TBD         SLP Evaluation Cognition  Overall Cognitive Status: History of cognitive impairments - at baseline (cognitive decline has increased since fall on 2/6) Arousal/Alertness: Awake/alert Orientation Level: Oriented to time;Oriented to situation (oriented X2, disoriented X3) Attention: Alternating (not able to sustain attention or easily switch tasks) Alternating Attention: Impaired Memory: Impaired Memory Impairment: Retrieval deficit;Decreased recall of new information;Decreased short term memory Decreased Short Term Memory: Verbal basic Awareness: Appears intact Executive Function: Self Monitoring;Self Correcting;Organizing (all impaired) Self Monitoring: Impaired Self Monitoring Impairment: Verbal basic;Verbal complex Self Correcting: Impaired Self Correcting Impairment: Verbal basic;Verbal complex (inconsistent self correcting) Behaviors: Perseveration Safety/Judgment: Appears intact [employer] Scales of Cognitive Functioning: Confused/appropriate       Comprehension  Auditory Comprehension Overall Auditory Comprehension: Impaired Yes/No Questions: Within Functional Limits Commands: Not tested Conversation: Simple Other Conversation Comments: Per daughter's report, pt wears hearing aids and did not have them for evaluation Interfering Components: Attention;Visual impairments;Hearing;Processing speed;Working Marine scientist;Motor planning EffectiveTechniques: Extra processing time;Increased volume;Pausing;Repetition;Slowed speech;Stressing words;Visual/Gestural cues Visual Recognition/Discrimination Discrimination: Within Function Limits Reading Comprehension Reading Status: Not tested    Expression Expression Primary Mode of Expression: Verbal Verbal Expression Overall Verbal Expression: Impaired Initiation: No impairment Automatic Speech: Name;Counting Level of Generative/Spontaneous Verbalization: Sentence Repetition: Impaired Level of  Impairment: Word level Naming: Impairment Responsive: 26-50% accurate Confrontation: Not tested Convergent: 50-74% accurate Divergent: 25-49% accurate Other Naming Comments: perserveration  Verbal Errors: Perseveration;Aware of errors (aware of errors ~half the time) Pragmatics: No impairment Interfering Components: Attention Effective Techniques: Sentence completion (gestures) Non-Verbal Means of Communication: Gestures Written Expression Dominant Hand: Right Written Expression: Not tested   Oral / Motor  Oral Motor/Sensory Function Overall Oral Motor/Sensory Function: Within functional limits Motor Speech Overall Motor Speech: Impaired Respiration: Within functional limits Phonation: Normal Resonance: Within functional limits Articulation: Within functional limitis Intelligibility: Intelligible Motor Planning: Impaired Level of Impairment: Sentence (word and phrase as well at times) Motor Speech Errors: Inconsistent Interfering Components: Hearing loss Effective Techniques: Slow rate;Increased vocal intensity;Over-articulate;Pause   GO                    Eulogio Ditch, B.S Graduate Clinician  08/06/2016, 2:45 PM   This information has been reviewed and agreed upon by this supervising clinician.  Orinda Kenner, [place], CCC-SLP

## 2016-08-06 NOTE — Evaluation (Signed)
Physical Therapy Evaluation Patient Details Name: Donna Rosario MRN: [id number] DOB: December 28, 1922 Today's Date: 08/06/2016   History of Present Illness  Pt is 83 year old female with past medical history significant for chronic atrial fibrillation on Protonix, hypertension, hyperlipidemia and minimal cognitive deficit at baseline presents to hospital on 08/04/16 secondary to change in mental status.  Pt with recent ED visit d/t fall 08/03/16 with chin laceration (pt also with R hand laceration a couple days prior) and discharged home.  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  Clinical Impression  Prior to hospital admission, pt was ambulating with SPC modified independently.  Pt lives at [facility].  Currently pt is SBA supine to sit; CGA to min assist with transfers, and min assist ambulating 120 feet with SPC.  Pt demonstrating some impulsiveness with activity and intermittent loss of balance to R and L laterally with ambulation requiring assist to steady/regain balance.  Pt would benefit from skilled PT to address noted impairments and functional limitations.  Recommend pt discharge to STR when medically appropriate.    Follow Up Recommendations SNF    Equipment Recommendations  Rolling walker with 5" wheels    Recommendations for Other Services       Precautions / Restrictions Precautions Precautions: Fall Restrictions Weight Bearing Restrictions: No      Mobility  Bed Mobility Overal bed mobility: Needs Assistance Bed Mobility: Supine to Sit     Supine to sit: Supervision;HOB elevated Sit to supine: Supervision   General bed mobility comments: SBA for safety; increased effort and time to perform  Transfers Overall transfer level: Needs assistance Equipment used: Straight cane Transfers: Sit to/from  Stand Sit to Stand: Min guard;Min assist         General transfer comment: assist to steady initially upon standing  Ambulation/Gait Ambulation/Gait assistance: Min assist Ambulation Distance (Feet): 120 Feet Assistive device: Straight cane   Gait velocity: decreased   General Gait Details: increased lateral sway with intermittent loss of balance to R or L requiring assist to steady and recorrect  Stairs            Wheelchair Mobility    Modified Rankin (Stroke Patients Only)       Balance Overall balance assessment: History of Falls;Needs assistance Sitting-balance support: No upper extremity supported;Feet supported Sitting balance-Leahy Scale: Good     Standing balance support: No upper extremity supported;During functional activity Standing balance-Leahy Scale: Poor Standing balance comment: loss of balance to R or L with ambulation                             Pertinent Vitals/Pain Pain Assessment: No/denies pain  Vitals (HR and O2 on room air) stable and WFL throughout treatment session.    Home Living Family/patient expects to be discharged to:: Skilled nursing facility   Available Help at Discharge: [place] (SNF at [facility] ) Type of Home: Independent living facility           Additional Comments: Pt is an ILF resident at [facility]; daughter [facility]) considering transition to ALF after STR (per OT note)    Prior Function Level of Independence: Independent with assistive device(s)         Comments: Prior to admission, pt indep with ADL, medication mgt, and finances, using SPC for ambulation;  2 falls reported by dtr in past year (per OT note)     Hand Dominance   Dominant Hand: Right    Extremity/Trunk Assessment   Upper Extremity Assessment Upper Extremity Assessment: Defer to OT evaluation    Lower Extremity Assessment Lower Extremity Assessment: Difficult to assess due to impaired  cognition;Generalized weakness    Cervical / Trunk Assessment Cervical / Trunk Assessment: Normal  Communication   Communication: Expressive difficulties (Mild expressive difficulties)  Cognition Arousal/Alertness: Awake/alert Behavior During Therapy: Impulsive (mildly impulsive with activities) Overall Cognitive Status: History of cognitive impairments - at baseline                 General Comments: dementia at baseline, appears that cognitive decline has increased since fall on 2/6 (per chart review); pleasant, mildly confused at times    General Comments   Nursing cleared pt for participation in physical therapy.  Pt agreeable to PT session.    Exercises     Assessment/Plan    PT Assessment Patient needs continued PT services  PT Problem List Decreased strength;Decreased balance;Decreased mobility;Decreased safety awareness;Decreased knowledge of precautions          PT Treatment Interventions DME instruction;Gait training;Functional mobility training;Therapeutic activities;Therapeutic exercise;Balance training;Neuromuscular re-education;Patient/family education    PT Goals (Current goals can be found in the Care Plan section)  Acute Rehab PT Goals Patient Stated Goal: to improve balance PT Goal Formulation: With patient Time For Goal Achievement: 08/20/16 Potential to Achieve Goals: Good    Frequency 7X/week   Barriers to discharge Decreased caregiver support      Co-evaluation               End of Session Equipment Utilized During Treatment: Gait belt Activity Tolerance: Patient tolerated treatment well Patient left: in chair;with call bell/phone within reach;with chair alarm set Nurse Communication: Mobility status;Precautions         Time: [id number] PT Time Calculation (min) (ACUTE ONLY): 27 min   Charges:   PT Evaluation $PT Eval Low Complexity: 1 Procedure PT Treatments $Therapeutic Activity: 8-22 mins   PT G CodesLeitha Bleak 08/06/2016, 4:33 PM Leitha Bleak, [place]

## 2016-08-06 NOTE — Consult Note (Signed)
Referring Physician: Tressia Miners    Chief Complaint: Altered mental status  HPI: Donna Rosario is an 83 y.o. female with a history of atrial fibrillation on Pradaxa who presented on 2/9 after a fall when trying to deliver a Valentine's Day present.  Unclear circumstances around the fall.  Patient was seen in the ED and sent home.  The next day the patient was found confused in her apartment after not answering her AM call. Daughter reports that on yesterday she was speaking nonsense but is better today.  Still not back to baseline.    Pts mental status significantly improved and she is following commands.    Past Medical History:  Diagnosis Date  . Arthritis   . Atrial fibrillation [facility]) Sept. 2011   [facility] Admission for RVR  . Chronic low back pain    due to lumbosacral degenerative joint disease  . Disturbance of skin sensation   . Hyperlipidemia   . Hypertension   . Mitral valve disorders(424.0)   . Osteoporosis   . Sciatica   . Thyroid disease    Hypothyroidism  . Vascular dementia, uncomplicated     Past Surgical History:  Procedure Laterality Date  . JOINT REPLACEMENT     Right Hip Oct 2011,  Left Knee 2008 ([facility])  . KNEE SURGERY     Left knee  . THYROIDECTOMY    . TOTAL HIP ARTHROPLASTY  2011    Family History  Problem Relation Age of Onset  . Other Mother     CHF  . Osteoporosis Mother   . Diabetes Mother   . Other Father     unknown  . Osteoporosis Father   . Heart disease Father    Social History:  reports that she has never smoked. She has never used smokeless tobacco. She reports that she drinks about 3.0 oz of alcohol per week . She reports that she does not use drugs.  Allergies:  Allergies  Allergen Reactions  . Sulfonamide Derivatives     Medications:  I have reviewed the patient's current medications. Prior to Admission:  Prescriptions Prior to Admission  Medication Sig Dispense Refill Last Dose  . alendronate (FOSAMAX) 70 MG tablet Take 1  tablet (70 mg total) by mouth once a week. Take with a full glass of water on an empty stomach. 12 tablet 0 08/03/2016 at Unknown time  . digoxin (LANOXIN) 0.125 MG tablet TAKE 1 TABLET EVERY DAY 90 tablet 0 08/03/2016 at 0800  . ergocalciferol (DRISDOL) 50000 UNITS capsule Take 1 capsule (50,000 Units total) by mouth once a week. 4 capsule 2 Unknown at Unknown  . felodipine (PLENDIL) 2.5 MG 24 hr tablet TAKE 1 TABLET BY MOUTH EVERY DAY 90 tablet 3 08/03/2016 at 0800  . metoprolol (LOPRESSOR) 50 MG tablet Take 1 tablet (50 mg total) by mouth 2 (two) times daily. 180 tablet 3 08/03/2016 at 2000  . acyclovir (ZOVIRAX) 400 MG tablet Take 1 tablet (400 mg total) by mouth 5 (five) times daily. (Patient not taking: Reported on 08/04/2016) 35 tablet 0 Completed Course at Unknown time  . ARMOUR THYROID 60 MG tablet TAKE 1 TABLET (60 MG TOTAL) BY MOUTH DAILY. (Patient not taking: Reported on 08/04/2016) 90 tablet 3 Not Taking at Unknown time  . Ascorbic Acid (VITAMIN C) 500 MG tablet Take 500 mg by mouth daily.     Unknown at Unknown  . benzocaine (ANBESOL JR) 10 % mucosal gel Apply to lips as needed for painful blisters 5.3 g 0  PRN at PRN  . Calcium Carbonate-Vit D-Min (CALCIUM 1200 PO) Take 1 capsule by mouth daily.     Unknown at Unknown  . dabigatran (PRADAXA) 150 MG CAPS capsule Take 1 capsule (150 mg total) by mouth 2 (two) times daily. (Patient not taking: Reported on 08/04/2016) 60 capsule 3 Not Taking at Unknown time  . felodipine (PLENDIL) 2.5 MG 24 hr tablet TAKE 1 TABLET BY MOUTH EVERY DAY 90 tablet 3 Taking  . fish oil-omega-3 fatty acids 1000 MG capsule Take 1 g by mouth daily.     Unknown at Unknown  . L-Lysine 500 MG TABS Take 1 tablet by mouth daily.     Unknown at Unknown  . lidocaine (XYLOCAINE) 2 % jelly Apply 1 application topically as needed. (Patient not taking: Reported on 08/04/2016) 30 mL 0 Completed Course at Unknown time  . potassium chloride SA (KLOR-CON M20) 20 MEQ tablet Take 1 tablet (20  mEq total) by mouth daily. (Patient not taking: Reported on 08/04/2016) 90 tablet 3 Not Taking at Unknown time  . Selenium 200 MCG TABS Take 1 tablet by mouth daily.     Unknown at Unknown  . traZODone (DESYREL) 50 MG tablet TAKE 1.5 TABLETS (75 MG TOTAL) BY MOUTH AT BEDTIME. (Patient not taking: Reported on 08/04/2016) 135 tablet 1 Not Taking at Unknown time  . triamcinolone ointment (KENALOG) 0.5 % APPLY TOPICALLY 2 TIMES DAILY TO leg rash  UNTIL RESOLVED (Patient not taking: Reported on 08/04/2016) 30 g 1 Completed Course at Unknown time  . triamcinolone ointment (KENALOG) 0.5 % APPLY TOPICALLY 2 TIMES DAILY TO FACIAL RASH UNTIL RESOLVED (Patient not taking: Reported on 08/04/2016) 30 g 1 Completed Course at Unknown time   Scheduled: . acyclovir  400 mg Oral 5 X Daily  . aspirin  300 mg Rectal Daily  . dabigatran  150 mg Oral Q12H  . digoxin  125 mcg Oral Daily  . felodipine  2.5 mg Oral Daily  . metoprolol  50 mg Oral BID  . thyroid  60 mg Oral QAC breakfast  . triamcinolone ointment  1 application Topical TID    ROS: History obtained from the patient  General ROS: negative for - chills, fatigue, fever, night sweats, weight gain or weight loss Psychological ROS: negative for - behavioral disorder, hallucinations, memory difficulties, mood swings or suicidal ideation Ophthalmic ROS: negative for - blurry vision, double vision, eye pain or loss of vision ENT ROS: negative for - epistaxis, nasal discharge, oral lesions, sore throat, tinnitus or vertigo Allergy and Immunology ROS: negative for - hives or itchy/watery eyes Hematological and Lymphatic ROS: negative for - bleeding problems, bruising or swollen lymph nodes Endocrine ROS: negative for - galactorrhea, hair pattern changes, polydipsia/polyuria or temperature intolerance Respiratory ROS: negative for - cough, hemoptysis, shortness of breath or wheezing Cardiovascular ROS: negative for - chest pain, dyspnea on exertion, edema or  irregular heartbeat Gastrointestinal ROS: negative for - abdominal pain, diarrhea, hematemesis, nausea/vomiting or stool incontinence Genito-Urinary ROS: negative for - dysuria, hematuria, incontinence or urinary frequency/urgency Musculoskeletal ROS: negative for - joint swelling or muscular weakness Neurological ROS: as noted in HPI Dermatological ROS: negative for rash and skin lesion changes  Physical Examination: Blood pressure (!) 167/81, pulse (!) 117, temperature 98 F (36.7 C), resp. rate 18, height 5\' 3"  (1.6 m), weight 56.7 kg (125 lb), SpO2 95 %.  HEENT-  Normocephalic, no lesions, without obvious abnormality.  Normal external eye and conjunctiva.  Normal TM's bilaterally.  Normal auditory  canals and external ears. Normal external nose, mucus membranes and septum.  Normal pharynx. Cardiovascular- S1, S2 normal, pulses palpable throughout   Lungs- chest clear, no wheezing, rales, normal symmetric air entry Abdomen- soft, non-tender; bowel sounds normal; no masses,  no organomegaly Extremities- no edema Lymph-no adenopathy palpable Musculoskeletal-no joint tenderness, deformity or swelling Skin-warm and dry, no hyperpigmentation, vitiligo, or suspicious lesions  Neurological Examination   Mental Status: Sleeping when I enter the room but easily awakened.  Some confusion as to where she is initially but easily reoriented.  Speech fluent but slurred.  Able to follow 3 step commands but requires extensive reinforcement and has more difficulty with the right. Cranial Nerves: II: Discs flat bilaterally; Does not appreciate stimuli in the right visual field.  Pupils equal, round, reactive to light and accommodation III,IV, VI: ptosis not present, extra-ocular motions intact bilaterally V,VII: smile symmetric, facial light touch sensation normal bilaterally VIII: hearing normal bilaterally IX,X: gag reflex present XI: bilateral shoulder shrug XII: midline tongue  extension Motor: Right : Upper extremity   5-/5    Left:     Upper extremity   5/5  Lower extremity   5/5    Lower extremity   5/5 Tone and bulk:normal tone throughout; no atrophy noted Sensory: Pinprick and light touch decreased on the right upper and lower extremity Deep Tendon Reflexes: 1+ in the upper extremities and absent in the lower extremities Plantars: Right: mute   Left: mute Cerebellar: No dysmetria noted with finger-to-nose and heel-to-shin testing Gait: not tested due to safety concerns   Laboratory Studies:  Basic Metabolic Panel:  Recent Labs Lab 08/03/16 1426 08/04/16 1104 08/05/16 0404  NA 139 138 137  K 3.8 4.1 3.8  CL 108 106 109  CO2 24 23 21*  GLUCOSE 107* 120* 108*  BUN 19 16 15   CREATININE 0.86 0.82 0.71  CALCIUM 8.9 8.6* 8.1*    Liver Function Tests:  Recent Labs Lab 08/03/16 1426 08/04/16 1104 08/05/16 0404  AST 28 32 29  ALT 17 16 15   ALKPHOS 59 60 53  BILITOT 1.0 1.6* 1.5*  PROT 6.9 6.9 6.5  ALBUMIN 3.8 3.8 3.5    Recent Labs Lab 08/04/16 1104  LIPASE 26    Recent Labs Lab 08/05/16 0929  AMMONIA 9    CBC:  Recent Labs Lab 08/03/16 1426 08/04/16 1104 08/05/16 0404  WBC 11.4* 11.4* 10.9  NEUTROABS  --  8.6*  --   HGB 15.0 14.7 14.2  HCT 46.4 44.9 42.9  MCV 88.3 89.7 89.3  PLT 247 218 208    Cardiac Enzymes:  Recent Labs Lab 08/04/16 1104  CKTOTAL 219  TROPONINI <0.03    BNP: Invalid input(s): POCBNP  CBG: No results for input(s): GLUCAP in the last 168 hours.  Microbiology: Results for orders placed or performed during the hospital encounter of 08/04/16  Urine culture     Status: None   Collection Time: 08/04/16 11:59 AM  Result Value Ref Range Status   Specimen Description URINE, CATHETERIZED  Final   Special Requests NONE  Final   Culture   Final    NO GROWTH Performed at [facility], [place] [address] [place], [place] [address]    Report Status 08/06/2016 FINAL  Final  MRSA PCR  Screening     Status: None   Collection Time: 08/04/16  7:08 PM  Result Value Ref Range Status   MRSA by PCR NEGATIVE NEGATIVE Final    Comment:  The GeneXpert MRSA Assay (FDA approved for NASAL specimens only), is one component of a comprehensive MRSA colonization surveillance program. It is not intended to diagnose MRSA infection nor to guide or monitor treatment for MRSA infections.     Coagulation Studies: No results for input(s): LABPROT, INR in the last 72 hours.  Urinalysis:   Recent Labs Lab 08/03/16 1426 08/04/16 1159  COLORURINE STRAW* YELLOW*  LABSPEC 1.010 1.016  PHURINE 8.0 8.0  GLUCOSEU NEGATIVE NEGATIVE  HGBUR NEGATIVE NEGATIVE  BILIRUBINUR NEGATIVE NEGATIVE  KETONESUR NEGATIVE 5*  PROTEINUR NEGATIVE NEGATIVE  NITRITE NEGATIVE NEGATIVE  LEUKOCYTESUR TRACE* NEGATIVE    Lipid Panel:    Component Value Date/Time   CHOL 215 (H) 01/12/2014 1400   TRIG 165.0 (H) 01/12/2014 1400   HDL 54.70 01/12/2014 1400   CHOLHDL 4 01/12/2014 1400   VLDL 33.0 01/12/2014 1400   LDLCALC 127 (H) 01/12/2014 1400    HgbA1C: No results found for: HGBA1C  Urine Drug Screen:  No results found for: LABOPIA, COCAINSCRNUR, LABBENZ, AMPHETMU, THCU, LABBARB  Alcohol Level: No results for input(s): ETH in the last 168 hours.   Imaging: Dg Chest 1 View  Result Date: 08/04/2016 CLINICAL DATA:  Recent fall.  Confusion. EXAM: CHEST 1 VIEW COMPARISON:  August 03, 2016 FINDINGS: Healed rib fractures in the upper left chest. No acute bony abnormalities. No pneumothorax. Stable cardiomegaly. The hila and mediastinum are normal. The lungs are unremarkable with no nodules, masses, or focal infiltrates. IMPRESSION: No active disease. Electronically Signed   By: Dorise Bullion III M.D   On: 08/04/2016 11:49   Dg Pelvis 1-2 Views  Result Date: 08/04/2016 CLINICAL DATA:  Recent fall. EXAM: PELVIS - 1-2 VIEW COMPARISON:  None. FINDINGS: The patient is status post right hip  replacement. The femoral component is not completely visualized. Within visualize limits, the hardware is in good position. No acute fractures are seen. Degenerative changes seen in the lower lumbar spine. IMPRESSION: No acute abnormalities. Electronically Signed   By: Dorise Bullion III M.D   On: 08/04/2016 11:48   Ct Head Wo Contrast  Result Date: 08/04/2016 CLINICAL DATA:  Confusion.  Fall yesterday. EXAM: CT HEAD WITHOUT CONTRAST CT CERVICAL SPINE WITHOUT CONTRAST TECHNIQUE: Multidetector CT imaging of the head and cervical spine was performed following the standard protocol without intravenous contrast. Multiplanar CT image reconstructions of the cervical spine were also generated. COMPARISON:  08/03/2016 FINDINGS: CT HEAD FINDINGS Brain: There is atrophy and chronic small vessel disease changes. No acute intracranial abnormality. Specifically, no hemorrhage, hydrocephalus, mass lesion, acute infarction, or significant intracranial injury. Vascular: No hyperdense vessel or unexpected calcification. Skull: No acute calvarial abnormality. Sinuses/Orbits: Visualized paranasal sinuses and mastoids clear. Orbital soft tissues unremarkable. Other: None CT CERVICAL SPINE FINDINGS Alignment: Normal alignment. Skull base and vertebrae: No acute fracture. No primary bone lesion or focal pathologic process. Soft tissues and spinal canal: No prevertebral fluid or swelling. No visible canal hematoma. Disc levels: Diffuse disc space narrowing and spurring. Degenerative facet disease bilaterally. Upper chest: Negative Other: Calcified and noncalcified thyroid nodules noted. IMPRESSION: No acute intracranial abnormality.Atrophy, chronic microvascular disease. Degenerative disc and facet disease throughout the cervical spine. No acute bony abnormality. Electronically Signed   By: Rolm Baptise M.D.   On: 08/04/2016 11:32   Ct Cervical Spine Wo Contrast  Result Date: 08/04/2016 CLINICAL DATA:  Confusion.  Fall yesterday.  EXAM: CT HEAD WITHOUT CONTRAST CT CERVICAL SPINE WITHOUT CONTRAST TECHNIQUE: Multidetector CT imaging of the head and cervical spine  was performed following the standard protocol without intravenous contrast. Multiplanar CT image reconstructions of the cervical spine were also generated. COMPARISON:  08/03/2016 FINDINGS: CT HEAD FINDINGS Brain: There is atrophy and chronic small vessel disease changes. No acute intracranial abnormality. Specifically, no hemorrhage, hydrocephalus, mass lesion, acute infarction, or significant intracranial injury. Vascular: No hyperdense vessel or unexpected calcification. Skull: No acute calvarial abnormality. Sinuses/Orbits: Visualized paranasal sinuses and mastoids clear. Orbital soft tissues unremarkable. Other: None CT CERVICAL SPINE FINDINGS Alignment: Normal alignment. Skull base and vertebrae: No acute fracture. No primary bone lesion or focal pathologic process. Soft tissues and spinal canal: No prevertebral fluid or swelling. No visible canal hematoma. Disc levels: Diffuse disc space narrowing and spurring. Degenerative facet disease bilaterally. Upper chest: Negative Other: Calcified and noncalcified thyroid nodules noted. IMPRESSION: No acute intracranial abnormality.Atrophy, chronic microvascular disease. Degenerative disc and facet disease throughout the cervical spine. No acute bony abnormality. Electronically Signed   By: Rolm Baptise M.D.   On: 08/04/2016 11:32   Mr Brain Wo Contrast  Result Date: 08/05/2016 CLINICAL DATA:  Recent fall with trauma to the head. EXAM: MRI HEAD WITHOUT CONTRAST TECHNIQUE: Multiplanar, multiecho pulse sequences of the brain and surrounding structures were obtained without intravenous contrast. COMPARISON:  CT studies 08/03/2016 and 08/04/2016. 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 No large confluent infarction.  No other acute infarction. Elsewhere, the brain shows chronic small-vessel ischemic changes of the hemispheric white matter. No sign of hemorrhage based on these limited sequences. No hydrocephalus. No extra-axial collection. Vascular: Not accurately evaluated. Skull and upper cervical spine: Negative Sinuses/Orbits: Not accurately evaluated. 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 No evidence of swelling or hemorrhage. Electronically Signed   By: Nelson Chimes M.D.   On: 08/05/2016 14:20   US Carotid Bilateral  Result Date: 08/06/2016 CLINICAL DATA:  CVA. EXAM: BILATERAL CAROTID DUPLEX ULTRASOUND TECHNIQUE: Pearline Cables scale imaging, color Doppler and duplex ultrasound were performed of bilateral carotid and vertebral arteries in the neck. COMPARISON:  MRI 08/05/2016 . FINDINGS: Criteria: Quantification of carotid stenosis is based on velocity parameters that correlate the residual internal carotid diameter with NASCET-based stenosis levels, using the diameter of the distal internal carotid lumen as the denominator for stenosis measurement. The following velocity measurements were obtained: RIGHT ICA:  42/5 cm/sec CCA:  123456 cm/sec SYSTOLIC ICA/CCA RATIO:  0.7 DIASTOLIC ICA/CCA RATIO:  1.1 ECA:  76 cm/sec LEFT ICA:  52/9 cm/sec CCA:  99991111 cm/sec SYSTOLIC ICA/CCA RATIO:  0.9 DIASTOLIC ICA/CCA RATIO:  1.1 ECA:  109 cm/sec RIGHT CAROTID ARTERY: Mild atherosclerotic plaque right carotid bifurcation. No flow limiting stenosis. RIGHT VERTEBRAL ARTERY:  Patent with antegrade flow. LEFT CAROTID ARTERY: Mild atherosclerotic vascular plaque left carotid bifurcation. No flow limiting stenosis. LEFT VERTEBRAL ARTERY:  Patent antegrade flow. Incidental note made of a 2.2 cm nodule with calcification in the left lobe of the thyroid. Dedicated thyroid ultrasound suggested for  further evaluation. IMPRESSION: 1. Mild bilateral carotid bifurcation atherosclerotic vascular plaque. No flow limiting stenosis. Degree of stenosis less than 50% bilaterally. 2. 2.2 cm calcified nodule left thyroid lobe. Dedicated thyroid ultrasound suggest for further evaluation. Electronically Signed   By: Marcello Moores  Register   On: 08/06/2016 10:34    Assessment: 83 y.o. female with a history of atrial fibrillation and  dementia presenting with an altered mental status after a fall.  Although post-concussive syndrome is on the differential, patient with focal findings on neurological examination suggesting that she may have had an ischemic or shower of ischemic events.  Patient unable to take po so is off Pradaxa.  Head CT reviewed and shows no acute changes.  Seizure is on the differential as well.    Stroke Risk Factors - atrial fibrillation, hyperlipidemia and hypertension  MRI brain consistent with embolic strokes.  - mentation improved - EEG pending but not sure needs it anymore - Pt/ot - restart pradaxa    08/06/2016, 11:13 AM

## 2016-08-06 NOTE — Progress Notes (Signed)
ANTICOAGULATION CONSULT NOTE - Initial Consult  Pharmacy Consult for Pradaxa  Indication: atrial fibrillation  Allergies  Allergen Reactions  . Sulfonamide Derivatives     Patient Measurements: Height: 5\' 3"  (160 cm) Weight: 125 lb (56.7 kg) IBW/kg (Calculated) : 52.4 Heparin Dosing Weight:    Vital Signs: Temp: 98 F (36.7 C) (02/12 0725) Temp Source: Oral (02/12 0430) BP: 167/81 (02/12 0725) Pulse Rate: 117 (02/12 0725)  Labs:  Recent Labs  08/03/16 1426 08/04/16 1104 08/05/16 0404  HGB 15.0 14.7 14.2  HCT 46.4 44.9 42.9  PLT 247 218 208  CREATININE 0.86 0.82 0.71  CKTOTAL  --  219  --   TROPONINI  --  <0.03  --     Estimated Creatinine Clearance: 37.9 mL/min (by C-G formula based on SCr of 0.71 mg/dL).   Medical History: Past Medical History:  Diagnosis Date  . Arthritis   . Atrial fibrillation [facility]) Sept. 2011   [facility] Admission for RVR  . Chronic low back pain    due to lumbosacral degenerative joint disease  . Disturbance of skin sensation   . Hyperlipidemia   . Hypertension   . Mitral valve disorders(424.0)   . Osteoporosis   . Sciatica   . Thyroid disease    Hypothyroidism  . Vascular dementia, uncomplicated     Assessment: Patient is a 83yo female admitted for altered mental status following a fall. Pharmacy consulted for Pradaxa dosing. Patient has Pradaxa 150mg  BID listed as home med for non-vavlular afib. MRI shows embolic event with no swelling or hemorrhage.   Plan:  Will resume home dose of Pradaxa 150mg  BID. Patient currently ordered Lovenox 40mg  q24h for DVT prevention, will discontinue this per protocol.  Paulina Fusi, PharmD, BCPS 08/06/2016 10:33 AM

## 2016-08-06 NOTE — Evaluation (Signed)
Occupational Therapy Evaluation Patient Details Name: Maliah Holtzen MRN: [id number] DOB: [birth year] Today's Date: 08/06/2016    History of Present Illness 83 year old female with past medical history significant for chronic atrial fibrillation on Protonix, hypertension, hyperlipidemia and minimal cognitive deficit at baseline presents to hospital on 08/04/16 secondary to change in mental status   Clinical Impression   Pt seen for OT evaluation this date. Pt is 83 year old female s/p fall admitted for AMS. Pt is an [place] resident at [facility], living alone. Pt has a hx of dementia and has some difficulty with recall but appropriate and following commands during session, requiring ocasional verbal cues to attend. Per daughter's report, pt was independent with ADL, medication mgt, finances, and driving at baseline. Pt reported "sometimes forget" to take medications.  Pt is currently limited in functional ADLs due to decreased cognition, decreased strength, decreased activity tolerance, particularly grip strength, making opening containers difficult. Pt able to open and squeeze toothpaste but did require additional time to complete. Pt requires minimal assist for LB dressing and bathing skills due to safety and would benefit from continued skilled OT services at SNF level of care to address noted impairments and functional deficits including falls prevention education/training and home modifications to improve safety. Daughter reported considering ALF level of care following STR due to cognitive decline and concern for medication management as well as recent history of 2 falls in past year. Pt would benefit from SNF to continue rehabilitation.    Follow Up Recommendations  SNF    Equipment Recommendations  None recommended by OT (defer to SNF recommendations based on next level of care (IL vs ALF))    Recommendations for Other Services       Precautions / Restrictions Precautions Precautions:  Fall Restrictions Weight Bearing Restrictions: No      Mobility Bed Mobility Overal bed mobility: Needs Assistance Bed Mobility: Supine to Sit;Sit to Supine     Supine to sit: Supervision Sit to supine: Supervision   General bed mobility comments: supervision for safety and additional time to complete but no physical assist required  Transfers Overall transfer level: Needs assistance Equipment used: Rolling walker (2 wheeled) Transfers: Sit to/from Stand Sit to Stand: Min guard         General transfer comment: verbal cues for hand placement on RW once in standing    Balance Overall balance assessment: History of Falls;Needs assistance Sitting-balance support: No upper extremity supported;Feet supported Sitting balance-Leahy Scale: Fair     Standing balance support: No upper extremity supported;During functional activity Standing balance-Leahy Scale: Fair                              ADL Overall ADL's : Needs assistance/impaired Eating/Feeding: Set up;Sitting   Grooming: Wash/dry face;Oral care;Set up;Standing;Min guard Grooming Details (indicate cue type and reason): pt able to wash face, brush teeth with min guard in standing at sink with ocassional UE support on sink, no LOB noted; stood for approx 2 minutes Upper Body Bathing: Sitting;Set up;Min guard   Lower Body Bathing: Minimal assistance;Cueing for safety;Sit to/from stand   Upper Body Dressing : Set up;Sitting;Supervision/safety   Lower Body Dressing: Minimal assistance;Set up;Sit to/from stand;Cueing for safety   Toilet Transfer: Min guard;RW;Cueing for safety;BSC Toilet Transfer Details (indicate cue type and reason): cueing for hand placement on RW to improve safe technique         Functional mobility during ADLs: Min  guard;Cueing for safety;Rolling walker General ADL Comments: generally min guard to min assist with cueing for safety for ADL     Vision Vision Assessment?: Yes Eye  Alignment: Within Functional Limits Ocular Range of Motion: Within Functional Limits Alignment/Gaze Preference: Within Defined Limits Tracking/Visual Pursuits: Other (comment) (requires verbal cues to attend, but able to track in all quads without difficulty) Visual Fields: No apparent deficits   Perception Perception Perception Tested?: Yes Comments: no apparent deficits   Praxis Praxis Praxis tested?: Within functional limits    Pertinent Vitals/Pain Pain Assessment: No/denies pain     Hand Dominance Right   Extremity/Trunk Assessment Upper Extremity Assessment Upper Extremity Assessment: Generalized weakness (grossly 3+/5, full ROM, poor grip strength)   Lower Extremity Assessment Lower Extremity Assessment: Defer to PT evaluation   Cervical / Trunk Assessment Cervical / Trunk Assessment: Normal   Communication Communication Communication: Other (comment);Expressive difficulties (cognitive deficits at baseline, mild expressive difficulties)   Cognition Arousal/Alertness: Awake/alert Behavior During Therapy: Impulsive (mildly impulsive with functional mobility) Overall Cognitive Status: History of cognitive impairments - at baseline                 General Comments: dementia at baseline, appears that cognitive decline has increased since fall on 2/6 (per chart review); pleasant, mildly confused at times   General Comments       Exercises       Shoulder Instructions      Home Living Family/patient expects to be discharged to:: Skilled nursing facility   Available Help at Discharge: Carmen (SNF at [facility] ) Type of Home: Independent living facility                           Additional Comments: Pt is an ILF resident at [facility]; daughter Education officer, community) considering transition to ALF after STR  Lives With: Alone    Prior Functioning/Environment Level of Independence: Independent with assistive device(s)        Comments:  Prior to admission, pt indep with ADL, medication mgt, and finances, using SPC for ambulation; 2 falls reported by dtr in past year        OT Problem List: Decreased strength;Decreased cognition;Decreased safety awareness;Decreased activity tolerance;Decreased knowledge of use of DME or AE   OT Treatment/Interventions:      OT Goals(Current goals can be found in the care plan section) Acute Rehab OT Goals Patient Stated Goal: get better OT Goal Formulation: With patient/family Time For Goal Achievement: 08/20/16 Potential to Achieve Goals: Good  OT Frequency:     Barriers to D/C:            Co-evaluation              End of Session Equipment Utilized During Treatment: Gait belt;Rolling walker  Activity Tolerance: Patient tolerated treatment well Patient left: in bed;with call bell/phone within reach;with bed alarm set;with family/visitor present   Time: [id number] OT Time Calculation (min): 36 min Charges:  OT General Charges $OT Visit: 1 Procedure OT Evaluation $OT Eval Low Complexity: 1 Procedure OT Treatments $Self Care/Home Management : 23-37 mins G-Codes:    Corky Sox, OTR/L 08/06/2016, 4:00 PM

## 2016-08-07 ENCOUNTER — Telehealth: Payer: Self-pay | Admitting: Internal Medicine

## 2016-08-07 LAB — BASIC METABOLIC PANEL
Anion gap: 5 (ref 5–15)
BUN: 22 mg/dL — ABNORMAL HIGH (ref 6–20)
CHLORIDE: 107 mmol/L (ref 101–111)
CO2: 23 mmol/L (ref 22–32)
Calcium: 8.5 mg/dL — ABNORMAL LOW (ref 8.9–10.3)
Creatinine, Ser: 0.75 mg/dL (ref 0.44–1.00)
GFR calc non Af Amer: >60 mL/min (ref >60)
Glucose, Bld: 105 mg/dL — ABNORMAL HIGH (ref 65–99)
POTASSIUM: 4 mmol/L (ref 3.5–5.1)
Sodium: 135 mmol/L (ref 135–145)

## 2016-08-07 MED ORDER — RISPERIDONE 0.25 MG PO TABS: 0.2500 mg | ORAL_TABLET | Freq: Two times a day (BID) | ORAL | 1 refills | Status: DC | PRN

## 2016-08-07 MED ORDER — ATORVASTATIN CALCIUM 40 MG PO TABS: 40.0000 mg | ORAL_TABLET | Freq: Every day | ORAL | 2 refills | Status: AC

## 2016-08-07 MED ORDER — RISPERIDONE 0.25 MG PO TABS
0.2500 mg | ORAL_TABLET | Freq: Once | ORAL | Status: AC
  Administered 2016-08-07: 0.25 mg via ORAL
  Filled 2016-08-07: qty 1

## 2016-08-07 MED ORDER — FELODIPINE ER 5 MG PO TB24
5.0000 mg | ORAL_TABLET | Freq: Every day | ORAL | Status: DC
  Administered 2016-08-07: 5 mg via ORAL
  Filled 2016-08-07: qty 1

## 2016-08-07 MED ORDER — FELODIPINE ER 5 MG PO TB24: 5.0000 mg | ORAL_TABLET | Freq: Every day | ORAL | 2 refills | Status: DC

## 2016-08-07 MED ORDER — LISINOPRIL 20 MG PO TABS: 20.0000 mg | ORAL_TABLET | Freq: Every day | ORAL | 2 refills | Status: DC

## 2016-08-07 MED ORDER — LISINOPRIL 20 MG PO TABS: 20.0000 mg | ORAL_TABLET | Freq: Every day | ORAL | Status: DC

## 2016-08-07 NOTE — Progress Notes (Signed)
[place] [facility] [place] NAME: Donna Rosario    MR#:  [id number]  DATE OF BIRTH:  1923/05/31  SUBJECTIVE:  CHIEF COMPLAINT:   Chief Complaint  Patient presents with  . Altered Mental Status   - Did not sleep last night. Appears to be delirious this morning -Blood pressure elevated early in the morning  REVIEW OF SYSTEMS:  Review of Systems  Constitutional: Negative for chills, fever and malaise/fatigue.  HENT: Positive for hearing loss. Negative for congestion, ear discharge and nosebleeds.   Eyes: Negative for blurred vision and double vision.  Respiratory: Negative for cough, shortness of breath and wheezing.   Cardiovascular: Negative for chest pain, palpitations and leg swelling.  Gastrointestinal: Negative for abdominal pain, constipation, diarrhea, nausea and vomiting.  Genitourinary: Negative for dysuria.  Musculoskeletal: Negative for myalgias and neck pain.  Neurological: Positive for speech change. Negative for dizziness, sensory change, focal weakness, seizures and headaches.  Psychiatric/Behavioral:       Confusion, delirious    DRUG ALLERGIES:   Allergies  Allergen Reactions  . Sulfonamide Derivatives     VITALS:  Blood pressure 137/71, pulse 65, temperature 98.2 F (36.8 C), temperature source Oral, resp. rate 18, height 5\' 3"  (1.6 m), weight 56.7 kg (125 lb), SpO2 95 %.  PHYSICAL EXAMINATION:  Physical Exam  GENERAL:  83 y.o.-year-old patient lying in the bed with no acute distress. EYES: Pupils equal, round, reactive to light and accommodation. No scleral icterus. Extraocular muscles intact.  HEENT: Head atraumatic, normocephalic. Oropharynx and nasopharynx clear.  NECK:  Supple, no jugular venous distention. No thyroid enlargement, no tenderness.  LUNGS: Normal breath sounds bilaterally, no wheezing, rales,rhonchi or crepitation. No use of accessory muscles of respiration. Decreased bibasilar breath  sounds CARDIOVASCULAR: S1, S2 normal. No rubs, or gallops. 2/6 systolic murmur is present ABDOMEN: Soft, nontender, nondistended. Bowel sounds present. No organomegaly or mass.  EXTREMITIES: No pedal edema, cyanosis, or clubbing.  NEUROLOGIC: Follows simple commands. Improved expressive aphasia noted. Strength is 5/5 all 4 extremities. Sensation intact. Gait not checked.  PSYCHIATRIC: The patient is alert and oriented 2. Pleasantly Confused and delirious this AM. Follow simple commands.  SKIN: No obvious rash, lesion, or ulcer.    LABORATORY PANEL:   CBC  Recent Labs Lab 08/05/16 0404  WBC 10.9  HGB 14.2  HCT 42.9  PLT 208   ------------------------------------------------------------------------------------------------------------------  Chemistries   Recent Labs Lab 08/05/16 0404 08/07/16 0452  NA 137 135  K 3.8 4.0  CL 109 107  CO2 21* 23  GLUCOSE 108* 105*  BUN 15 22*  CREATININE 0.71 0.75  CALCIUM 8.1* 8.5*  AST 29  --   ALT 15  --   ALKPHOS 53  --   BILITOT 1.5*  --    ------------------------------------------------------------------------------------------------------------------  Cardiac Enzymes  Recent Labs Lab 08/04/16 1104  TROPONINI <0.03   ------------------------------------------------------------------------------------------------------------------  RADIOLOGY:  Mr Brain Wo Contrast  Result Date: 08/05/2016 CLINICAL DATA:  Recent fall with trauma to the head. EXAM: MRI HEAD WITHOUT CONTRAST TECHNIQUE: Multiplanar, multiecho pulse sequences of the brain and surrounding structures were obtained without intravenous contrast. COMPARISON:  CT studies 08/03/2016 and 08/04/2016. 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 No large confluent infarction. No other acute infarction. Elsewhere, the brain shows chronic small-vessel  ischemic changes of the hemispheric white matter. No sign of hemorrhage based on these limited  sequences. No hydrocephalus. No extra-axial collection. Vascular: Not accurately evaluated. Skull and upper cervical spine: Negative Sinuses/Orbits: Not accurately evaluated. 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 No evidence of swelling or hemorrhage. Electronically Signed   By: Nelson Chimes M.D.   On: 08/05/2016 14:20   US Carotid Bilateral  Result Date: 08/06/2016 CLINICAL DATA:  CVA. EXAM: BILATERAL CAROTID DUPLEX ULTRASOUND TECHNIQUE: Pearline Cables scale imaging, color Doppler and duplex ultrasound were performed of bilateral carotid and vertebral arteries in the neck. COMPARISON:  MRI 08/05/2016 . FINDINGS: Criteria: Quantification of carotid stenosis is based on velocity parameters that correlate the residual internal carotid diameter with NASCET-based stenosis levels, using the diameter of the distal internal carotid lumen as the denominator for stenosis measurement. The following velocity measurements were obtained: RIGHT ICA:  42/5 cm/sec CCA:  123456 cm/sec SYSTOLIC ICA/CCA RATIO:  0.7 DIASTOLIC ICA/CCA RATIO:  1.1 ECA:  76 cm/sec LEFT ICA:  52/9 cm/sec CCA:  99991111 cm/sec SYSTOLIC ICA/CCA RATIO:  0.9 DIASTOLIC ICA/CCA RATIO:  1.1 ECA:  109 cm/sec RIGHT CAROTID ARTERY: Mild atherosclerotic plaque right carotid bifurcation. No flow limiting stenosis. RIGHT VERTEBRAL ARTERY:  Patent with antegrade flow. LEFT CAROTID ARTERY: Mild atherosclerotic vascular plaque left carotid bifurcation. No flow limiting stenosis. LEFT VERTEBRAL ARTERY:  Patent antegrade flow. Incidental note made of a 2.2 cm nodule with calcification in the left lobe of the thyroid. Dedicated thyroid ultrasound suggested for further evaluation. IMPRESSION: 1. Mild bilateral carotid bifurcation  atherosclerotic vascular plaque. No flow limiting stenosis. Degree of stenosis less than 50% bilaterally. 2. 2.2 cm calcified nodule left thyroid lobe. Dedicated thyroid ultrasound suggest for further evaluation. Electronically Signed   By: Marcello Moores  Register   On: 08/06/2016 10:34    EKG:   Orders placed or performed during the hospital encounter of 08/04/16  . ED EKG  . ED EKG    ASSESSMENT AND PLAN:   83 year old female with past medical history significant for chronic atrial fibrillation on Protonix, hypertension, hyperlipidemia and minimal cognitive deficit at baseline presents to hospital secondary to change in mental status.  #1 acute encephalopathy- secondary to shower of embolic infarcts - MRI with left parietal infarct, MRA with no significant stenosis, carotid dopplers with no significant stenosis and ECHO normal - known h/o afib- pradaxa restarted. Added statin - EEG with no epileptiform activity. Diffuse slowing noted. -At baseline patient lives alone, still handles her financial matters. Urine analysis negative for infection, chest x-ray is negative for infection. -No fevers noted. Discontinued antibiotics vancomycin and cefepime. -Neurology consulted.   #2 chronic atrial fibrillation-on digoxin and metoprolol. Restarted pradaxa  #3 hypertension-on metoprolol and felodipine BP elevated usually early AM. Start nightly lisinopril  #4 Hypothyroidism- on armour thyroid  #5 Delirium- acute, likely didn't sleep at all last night. 1 dose of risperidone and monitor  #5 DVT prophylaxis- lovenox  PT/OT consults are appreciated, recommended SNF If delirium resolves- discharge today   All the records are reviewed and case discussed with Care Management/Social Workerr. Management plans discussed with the patient, family and they are in agreement.  CODE STATUS: DNR  TOTAL TIME TAKING CARE OF THIS PATIENT: 37 minutes.   POSSIBLE D/C TODAY OR TOMORROW, DEPENDING ON CLINICAL  CONDITION.   Gladstone Lighter M.D on 08/07/2016 at 9:57 AM  Between 7am to 6pm - Pager - [phone]  After 6pm go to [email or web address] - [place]  [place]  [phone]  CC: Primary care physician; Crecencio Mc, MD

## 2016-08-07 NOTE — Consult Note (Signed)
Confusion improved. Pt is following commands. Don't see any focal abnormalities.     Past Medical History:  Diagnosis Date  . Arthritis   . Atrial fibrillation [facility]) Sept. 2011   [facility] Admission for RVR  . Chronic low back pain    due to lumbosacral degenerative joint disease  . Disturbance of skin sensation   . Hyperlipidemia   . Hypertension   . Mitral valve disorders(424.0)   . Osteoporosis   . Sciatica   . Thyroid disease    Hypothyroidism  . Vascular dementia, uncomplicated     Past Surgical History:  Procedure Laterality Date  . JOINT REPLACEMENT     Right Hip Oct 2011,  Left Knee 2008 ([place])  . KNEE SURGERY     Left knee  . THYROIDECTOMY    . TOTAL HIP ARTHROPLASTY  2011    Family History  Problem Relation Age of Onset  . Other Mother     CHF  . Osteoporosis Mother   . Diabetes Mother   . Other Father     unknown  . Osteoporosis Father   . Heart disease Father    Social History:  reports that she has never smoked. She has never used smokeless tobacco. She reports that she drinks about 3.0 oz of alcohol per week . She reports that she does not use drugs.  Allergies:  Allergies  Allergen Reactions  . Sulfonamide Derivatives     Medications:  I have reviewed the patient's current medications. Prior to Admission:  Prescriptions Prior to Admission  Medication Sig Dispense Refill Last Dose  . alendronate (FOSAMAX) 70 MG tablet Take 1 tablet (70 mg total) by mouth once a week. Take with a full glass of water on an empty stomach. 12 tablet 0 08/03/2016 at Unknown time  . digoxin (LANOXIN) 0.125 MG tablet TAKE 1 TABLET EVERY DAY 90 tablet 0 08/03/2016 at 0800  . ergocalciferol (DRISDOL) 50000 UNITS capsule Take 1 capsule (50,000 Units total) by mouth once a week. 4 capsule 2 Unknown at Unknown  . felodipine (PLENDIL) 2.5 MG 24 hr tablet TAKE 1 TABLET BY MOUTH EVERY DAY 90 tablet 3 08/03/2016 at 0800  . metoprolol (LOPRESSOR) 50 MG tablet Take 1 tablet (50 mg  total) by mouth 2 (two) times daily. 180 tablet 3 08/03/2016 at 2000  . acyclovir (ZOVIRAX) 400 MG tablet Take 1 tablet (400 mg total) by mouth 5 (five) times daily. (Patient not taking: Reported on 08/04/2016) 35 tablet 0 Completed Course at Unknown time  . ARMOUR THYROID 60 MG tablet TAKE 1 TABLET (60 MG TOTAL) BY MOUTH DAILY. (Patient not taking: Reported on 08/04/2016) 90 tablet 3 Not Taking at Unknown time  . Ascorbic Acid (VITAMIN C) 500 MG tablet Take 500 mg by mouth daily.     Unknown at Unknown  . benzocaine (ANBESOL JR) 10 % mucosal gel Apply to lips as needed for painful blisters 5.3 g 0 PRN at PRN  . Calcium Carbonate-Vit D-Min (CALCIUM 1200 PO) Take 1 capsule by mouth daily.     Unknown at Unknown  . dabigatran (PRADAXA) 150 MG CAPS capsule Take 1 capsule (150 mg total) by mouth 2 (two) times daily. (Patient not taking: Reported on 08/04/2016) 60 capsule 3 Not Taking at Unknown time  . felodipine (PLENDIL) 2.5 MG 24 hr tablet TAKE 1 TABLET BY MOUTH EVERY DAY 90 tablet 3 Taking  . fish oil-omega-3 fatty acids 1000 MG capsule Take 1 g by mouth daily.  Unknown at Unknown  . L-Lysine 500 MG TABS Take 1 tablet by mouth daily.     Unknown at Unknown  . lidocaine (XYLOCAINE) 2 % jelly Apply 1 application topically as needed. (Patient not taking: Reported on 08/04/2016) 30 mL 0 Completed Course at Unknown time  . potassium chloride SA (KLOR-CON M20) 20 MEQ tablet Take 1 tablet (20 mEq total) by mouth daily. (Patient not taking: Reported on 08/04/2016) 90 tablet 3 Not Taking at Unknown time  . Selenium 200 MCG TABS Take 1 tablet by mouth daily.     Unknown at Unknown  . traZODone (DESYREL) 50 MG tablet TAKE 1.5 TABLETS (75 MG TOTAL) BY MOUTH AT BEDTIME. (Patient not taking: Reported on 08/04/2016) 135 tablet 1 Not Taking at Unknown time  . triamcinolone ointment (KENALOG) 0.5 % APPLY TOPICALLY 2 TIMES DAILY TO leg rash  UNTIL RESOLVED (Patient not taking: Reported on 08/04/2016) 30 g 1 Completed Course  at Unknown time  . triamcinolone ointment (KENALOG) 0.5 % APPLY TOPICALLY 2 TIMES DAILY TO FACIAL RASH UNTIL RESOLVED (Patient not taking: Reported on 08/04/2016) 30 g 1 Completed Course at Unknown time   Scheduled: . acyclovir  400 mg Oral 5 X Daily  . atorvastatin  40 mg Oral q1800  . dabigatran  150 mg Oral Q12H  . digoxin  125 mcg Oral Daily  . felodipine  5 mg Oral Daily  . lisinopril  20 mg Oral QHS  . metoprolol  50 mg Oral BID  . thyroid  60 mg Oral QAC breakfast  . triamcinolone ointment  1 application Topical TID    ROS: History obtained from the patient  General ROS: negative for - chills, fatigue, fever, night sweats, weight gain or weight loss Psychological ROS: negative for - behavioral disorder, hallucinations, memory difficulties, mood swings or suicidal ideation Ophthalmic ROS: negative for - blurry vision, double vision, eye pain or loss of vision ENT ROS: negative for - epistaxis, nasal discharge, oral lesions, sore throat, tinnitus or vertigo Allergy and Immunology ROS: negative for - hives or itchy/watery eyes Hematological and Lymphatic ROS: negative for - bleeding problems, bruising or swollen lymph nodes Endocrine ROS: negative for - galactorrhea, hair pattern changes, polydipsia/polyuria or temperature intolerance Respiratory ROS: negative for - cough, hemoptysis, shortness of breath or wheezing Cardiovascular ROS: negative for - chest pain, dyspnea on exertion, edema or irregular heartbeat Gastrointestinal ROS: negative for - abdominal pain, diarrhea, hematemesis, nausea/vomiting or stool incontinence Genito-Urinary ROS: negative for - dysuria, hematuria, incontinence or urinary frequency/urgency Musculoskeletal ROS: negative for - joint swelling or muscular weakness Neurological ROS: as noted in HPI Dermatological ROS: negative for rash and skin lesion changes  Physical Examination: Blood pressure 137/71, pulse 65, temperature 98.2 F (36.8 C), temperature  source Oral, resp. rate 18, height 5\' 3"  (1.6 m), weight 56.7 kg (125 lb), SpO2 95 %.  HEENT-  Normocephalic, no lesions, without obvious abnormality.  Normal external eye and conjunctiva.  Normal TM's bilaterally.  Normal auditory canals and external ears. Normal external nose, mucus membranes and septum.  Normal pharynx. Cardiovascular- S1, S2 normal, pulses palpable throughout   Lungs- chest clear, no wheezing, rales, normal symmetric air entry Abdomen- soft, non-tender; bowel sounds normal; no masses,  no organomegaly Extremities- no edema Lymph-no adenopathy palpable Musculoskeletal-no joint tenderness, deformity or swelling Skin-warm and dry, no hyperpigmentation, vitiligo, or suspicious lesions  Neurological Examination   Mental Status: Sleeping when I enter the room but easily awakened.  Some confusion as to where she  is initially but easily reoriented.  Speech fluent but slurred.  Able to follow 3 step commands but requires extensive reinforcement and has more difficulty with the right. Cranial Nerves: II: Discs flat bilaterally; Does not appreciate stimuli in the right visual field.  Pupils equal, round, reactive to light and accommodation III,IV, VI: ptosis not present, extra-ocular motions intact bilaterally V,VII: smile symmetric, facial light touch sensation normal bilaterally VIII: hearing normal bilaterally IX,X: gag reflex present XI: bilateral shoulder shrug XII: midline tongue extension Motor: Right : Upper extremity   5-/5    Left:     Upper extremity   5/5  Lower extremity   5/5    Lower extremity   5/5 Tone and bulk:normal tone throughout; no atrophy noted Sensory: Pinprick and light touch decreased on the right upper and lower extremity Deep Tendon Reflexes: 1+ in the upper extremities and absent in the lower extremities Plantars: Right: mute   Left: mute Cerebellar: No dysmetria noted with finger-to-nose and heel-to-shin testing Gait: not tested due to safety  concerns   Laboratory Studies:  Basic Metabolic Panel:  Recent Labs Lab 08/03/16 1426 08/04/16 1104 08/05/16 0404 08/07/16 0452  NA 139 138 137 135  K 3.8 4.1 3.8 4.0  CL 108 106 109 107  CO2 24 23 21* 23  GLUCOSE 107* 120* 108* 105*  BUN 19 16 15  22*  CREATININE 0.86 0.82 0.71 0.75  CALCIUM 8.9 8.6* 8.1* 8.5*    Liver Function Tests:  Recent Labs Lab 08/03/16 1426 08/04/16 1104 08/05/16 0404  AST 28 32 29  ALT 17 16 15   ALKPHOS 59 60 53  BILITOT 1.0 1.6* 1.5*  PROT 6.9 6.9 6.5  ALBUMIN 3.8 3.8 3.5    Recent Labs Lab 08/04/16 1104  LIPASE 26    Recent Labs Lab 08/05/16 0929  AMMONIA 9    CBC:  Recent Labs Lab 08/03/16 1426 08/04/16 1104 08/05/16 0404  WBC 11.4* 11.4* 10.9  NEUTROABS  --  8.6*  --   HGB 15.0 14.7 14.2  HCT 46.4 44.9 42.9  MCV 88.3 89.7 89.3  PLT 247 218 208    Cardiac Enzymes:  Recent Labs Lab 08/04/16 1104  CKTOTAL 219  TROPONINI <0.03    BNP: Invalid input(s): POCBNP  CBG: No results for input(s): GLUCAP in the last 168 hours.  Microbiology: Results for orders placed or performed during the hospital encounter of 08/04/16  Urine culture     Status: None   Collection Time: 08/04/16 11:59 AM  Result Value Ref Range Status   Specimen Description URINE, CATHETERIZED  Final   Special Requests NONE  Final   Culture   Final    NO GROWTH Performed at [facility], [address] [place],  [address]    Report Status 08/06/2016 FINAL  Final  MRSA PCR Screening     Status: None   Collection Time: 08/04/16  7:08 PM  Result Value Ref Range Status   MRSA by PCR NEGATIVE NEGATIVE Final    Comment:        The GeneXpert MRSA Assay (FDA approved for NASAL specimens only), is one component of a comprehensive MRSA colonization surveillance program. It is not intended to diagnose MRSA infection nor to guide or monitor treatment for MRSA infections.     Coagulation Studies: No results for input(s):  LABPROT, INR in the last 72 hours.  Urinalysis:   Recent Labs Lab 08/03/16 1426 08/04/16 1159  COLORURINE STRAW* YELLOW*  LABSPEC 1.010  1.016  PHURINE 8.0 8.0  GLUCOSEU NEGATIVE NEGATIVE  HGBUR NEGATIVE NEGATIVE  BILIRUBINUR NEGATIVE NEGATIVE  KETONESUR NEGATIVE 5*  PROTEINUR NEGATIVE NEGATIVE  NITRITE NEGATIVE NEGATIVE  LEUKOCYTESUR TRACE* NEGATIVE    Lipid Panel:    Component Value Date/Time   CHOL 215 (H) 01/12/2014 1400   TRIG 165.0 (H) 01/12/2014 1400   HDL 54.70 01/12/2014 1400   CHOLHDL 4 01/12/2014 1400   VLDL 33.0 01/12/2014 1400   LDLCALC 127 (H) 01/12/2014 1400    HgbA1C: No results found for: HGBA1C  Urine Drug Screen:  No results found for: LABOPIA, COCAINSCRNUR, LABBENZ, AMPHETMU, THCU, LABBARB  Alcohol Level: No results for input(s): ETH in the last 168 hours.   Imaging: Mr Brain Wo Contrast  Result Date: 08/05/2016 CLINICAL DATA:  Recent fall with trauma to the head. EXAM: MRI HEAD WITHOUT CONTRAST TECHNIQUE: Multiplanar, multiecho pulse sequences of the brain and surrounding structures were obtained without intravenous contrast. COMPARISON:  CT studies 08/03/2016 and 08/04/2016. 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 No large confluent infarction. No other acute infarction. Elsewhere, the brain shows chronic small-vessel ischemic changes of the hemispheric white matter. No sign of hemorrhage based on these limited sequences. No hydrocephalus. No extra-axial collection. Vascular: Not accurately evaluated. Skull and upper cervical spine: Negative Sinuses/Orbits: Not accurately evaluated. 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 No evidence of swelling or hemorrhage. Electronically Signed    By: Nelson Chimes M.D.   On: 08/05/2016 14:20   US Carotid Bilateral  Result Date: 08/06/2016 CLINICAL DATA:  CVA. EXAM: BILATERAL CAROTID DUPLEX ULTRASOUND TECHNIQUE: Pearline Cables scale imaging, color Doppler and duplex ultrasound were performed of bilateral carotid and vertebral arteries in the neck. COMPARISON:  MRI 08/05/2016 . FINDINGS: Criteria: Quantification of carotid stenosis is based on velocity parameters that correlate the residual internal carotid diameter with NASCET-based stenosis levels, using the diameter of the distal internal carotid lumen as the denominator for stenosis measurement. The following velocity measurements were obtained: RIGHT ICA:  42/5 cm/sec CCA:  123456 cm/sec SYSTOLIC ICA/CCA RATIO:  0.7 DIASTOLIC ICA/CCA RATIO:  1.1 ECA:  76 cm/sec LEFT ICA:  52/9 cm/sec CCA:  99991111 cm/sec SYSTOLIC ICA/CCA RATIO:  0.9 DIASTOLIC ICA/CCA RATIO:  1.1 ECA:  109 cm/sec RIGHT CAROTID ARTERY: Mild atherosclerotic plaque right carotid bifurcation. No flow limiting stenosis. RIGHT VERTEBRAL ARTERY:  Patent with antegrade flow. LEFT CAROTID ARTERY: Mild atherosclerotic vascular plaque left carotid bifurcation. No flow limiting stenosis. LEFT VERTEBRAL ARTERY:  Patent antegrade flow. Incidental note made of a 2.2 cm nodule with calcification in the left lobe of the thyroid. Dedicated thyroid ultrasound suggested for further evaluation. IMPRESSION: 1. Mild bilateral carotid bifurcation atherosclerotic vascular plaque. No flow limiting stenosis. Degree of stenosis less than 50% bilaterally. 2. 2.2 cm calcified nodule left thyroid lobe. Dedicated thyroid ultrasound suggest for further evaluation. Electronically Signed   By: Marcello Moores  Register   On: 08/06/2016 10:34    Assessment: 83 y.o. female with a history of atrial fibrillation and dementia presenting with an altered mental status after a fall.  Although post-concussive syndrome is on the differential, patient with focal findings on neurological examination  suggesting that she may have had an ischemic or shower of ischemic events.  Patient unable to take po so is off Pradaxa.  Head  CT reviewed and shows no acute changes.  Seizure is on the differential as well.    Stroke Risk Factors - atrial fibrillation, hyperlipidemia and hypertension  MRI brain consistent with embolic strokes.   EEG done no epileptiform activity Anticoagulation restarted D/w daughter at bedside.  Pt will be living in assisted living with supervision D/c planning today      08/07/2016, 11:15 AM

## 2016-08-07 NOTE — Clinical Social Work Placement (Signed)
   CLINICAL SOCIAL WORK PLACEMENT  NOTE  Date:  08/07/2016  Patient Details  Name: Donna Rosario MRN: [id number] Date of Birth: 05-Feb-1923  Clinical Social Work is seeking post-discharge placement for this patient at the [place] level of care (*CSW will initial, date and re-position this form in  chart as items are completed):  Yes   Patient/family provided with [facility] Work Department's list of facilities offering this level of care within the geographic area requested by the patient (or if unable, by the patient's family).  Yes   Patient/family informed of their freedom to choose among providers that offer the needed level of care, that participate in Medicare, Medicaid or managed care program needed by the patient, have an available bed and are willing to accept the patient.  Yes   Patient/family informed of [facility]'s ownership interest in [facility] and [facility], as well as of the fact that they are under no obligation to receive care at these facilities.  PASRR submitted to EDS on       PASRR number received on       Existing PASRR number confirmed on 08/05/16     FL2 transmitted to all facilities in geographic area requested by pt/family on 08/05/16     FL2 transmitted to all facilities within larger geographic area on       Patient informed that his/her managed care company has contracts with or will negotiate with certain facilities, including the following:        Yes   Patient/family informed of bed offers received.  Patient chooses bed at  [facility])     Physician recommends and patient chooses bed at      Patient to be transferred to  [facility]) on 08/07/16.  Patient to be transferred to facility by  [facility])     Patient family notified on 08/07/16 of transfer.  Name of family member notified:   (Patient's daughter Donna Rosario was notified about patient's discharge to [facility] today. )      PHYSICIAN       Additional Comment:    _______________________________________________ Danie Chandler, Student-Social Work 08/07/2016, 11:05 AM

## 2016-08-07 NOTE — Telephone Encounter (Signed)
Donna Rosario from [facility] called and needs a HFU for 1 week with Dr. Derrel Nip for encephalopyosis. Pt is being discharged and will be going to [facility] advise, thank you!  Call pt @ [phone]

## 2016-08-07 NOTE — Care Management Important Message (Signed)
Important Message  Patient Details  Name: Donna Rosario MRN: [id number] Date of Birth: 05/05/1923   Medicare Important Message Given:  Yes    Jolly Mango, RN 08/07/2016, 10:40 AM

## 2016-08-07 NOTE — Progress Notes (Signed)
Pt being discharged to [facility] today. PIV removed. Discharge packet provided for pt, now new prescriptions needed. Daughter was given a copy of summary for her own record, as well as the DNR form. Report called to RN at [facility], all questions answered. She Is leaving with all her belongings. She will be transported via EMS.

## 2016-08-07 NOTE — Discharge Summary (Signed)
[place] [place] [facility] [place] NAME: Donna Rosario    MR#:  [id number]  DATE OF BIRTH:  1923-02-20  DATE OF ADMISSION:  08/04/2016   ADMITTING PHYSICIAN: Nicholes Mango, MD  DATE OF DISCHARGE: 08/07/16  PRIMARY CARE PHYSICIAN: Crecencio Mc, MD   ADMISSION DIAGNOSIS:   Encephalopathy [G93.40] Altered mental status, unspecified altered mental status type [R41.82]  DISCHARGE DIAGNOSIS:   Active Problems:   Encephalopathy acute   SECONDARY DIAGNOSIS:   Past Medical History:  Diagnosis Date  . Arthritis   . Atrial fibrillation [facility]) Sept. 2011   [facility] Admission for RVR  . Chronic low back pain    due to lumbosacral degenerative joint disease  . Disturbance of skin sensation   . Hyperlipidemia   . Hypertension   . Mitral valve disorders(424.0)   . Osteoporosis   . Sciatica   . Thyroid disease    Hypothyroidism  . Vascular dementia, uncomplicated     HOSPITAL COURSE:   83 year old female with past medical history significant for chronic atrial fibrillation on Protonix, hypertension, hyperlipidemia and minimal cognitive deficit at baseline presents to hospital secondary to change in mental status.  #1 acute encephalopathy- secondary to shower of embolic infarcts - MRI with left parietal infarct, MRA with no significant stenosis, carotid dopplers with no significant stenosis and ECHO normal - known h/o afib- pradaxa restarted. Added statin - EEG with no epileptiform activity. Diffuse slowing noted. -At baseline patient lives alone, still handles her financial matters. Urine analysis negative for infection, chest x-ray is negative for infection. -No fevers noted. Discontinued antibiotics vancomycin and cefepime. -Neurology consulted.    #2 chronic atrial fibrillation-on digoxin and metoprolol. Restarted pradaxa  #3 hypertension-on metoprolol and felodipine BP elevated usually early AM. Start nightly lisinopril  #4 Hypothyroidism-  on armour thyroid  #5 Delirium- acute, from not having sleep at all last night. Improved with a dose of risperidone. Continue prn risperidone. Likely developing early dementia- based on MRI and EEG findings  PT/OT consults are appreciated, recommended SNF Possible discharge today  DISCHARGE CONDITIONS:   Guarded  CONSULTS OBTAINED:   Treatment Team:  Alexis Goodell, MD  DRUG ALLERGIES:   Allergies  Allergen Reactions  . Sulfonamide Derivatives    DISCHARGE MEDICATIONS:   Allergies as of 08/07/2016      Reactions   Sulfonamide Derivatives       Medication List    STOP taking these medications   acyclovir 400 MG tablet Commonly known as:  ZOVIRAX   lidocaine 2 % jelly Commonly known as:  XYLOCAINE   potassium chloride SA 20 MEQ tablet Commonly known as:  KLOR-CON M20   triamcinolone ointment 0.5 % Commonly known as:  KENALOG     TAKE these medications   alendronate 70 MG tablet Commonly known as:  FOSAMAX Take 1 tablet (70 mg total) by mouth once a week. Take with a full glass of water on an empty stomach.   ARMOUR THYROID 60 MG tablet Generic drug:  thyroid TAKE 1 TABLET (60 MG TOTAL) BY MOUTH DAILY.   atorvastatin 40 MG tablet Commonly known as:  LIPITOR Take 1 tablet (40 mg total) by mouth daily at 6 PM.   benzocaine 10 % mucosal gel Commonly known as:  ANBESOL JR Apply to lips as needed for painful blisters   CALCIUM 1200 PO Take 1 capsule by mouth daily.   dabigatran 150 MG Caps capsule Commonly known as:  PRADAXA Take 1 capsule (  150 mg total) by mouth 2 (two) times daily.   digoxin 0.125 MG tablet Commonly known as:  LANOXIN TAKE 1 TABLET EVERY DAY   ergocalciferol 50000 units capsule Commonly known as:  DRISDOL Take 1 capsule (50,000 Units total) by mouth once a week.   felodipine 5 MG 24 hr tablet Commonly known as:  PLENDIL Take 1 tablet (5 mg total) by mouth daily. Start taking on:  08/08/2016 What changed:  See the new  instructions.   fish oil-omega-3 fatty acids 1000 MG capsule Take 1 g by mouth daily.   L-Lysine 500 MG Tabs Take 1 tablet by mouth daily.   lisinopril 20 MG tablet Commonly known as:  PRINIVIL,ZESTRIL Take 1 tablet (20 mg total) by mouth at bedtime.   metoprolol 50 MG tablet Commonly known as:  LOPRESSOR Take 1 tablet (50 mg total) by mouth 2 (two) times daily.   risperiDONE 0.25 MG tablet Commonly known as:  RISPERDAL Take 1 tablet (0.25 mg total) by mouth 2 (two) times daily as needed (confusion).   Selenium 200 MCG Tabs Take 1 tablet by mouth daily.   traZODone 50 MG tablet Commonly known as:  DESYREL TAKE 1.5 TABLETS (75 MG TOTAL) BY MOUTH AT BEDTIME.   vitamin C 500 MG tablet Commonly known as:  ASCORBIC ACID Take 500 mg by mouth daily.        DISCHARGE INSTRUCTIONS:   1. PCP f/u in 2 weeks 2. Neurology f/u in 1 week  DIET:   Cardiac diet  ACTIVITY:   Activity as tolerated  OXYGEN:   Home Oxygen: No.  Oxygen Delivery: room air  DISCHARGE LOCATION:   nursing home   If you experience worsening of your admission symptoms, develop shortness of breath, life threatening emergency, suicidal or homicidal thoughts you must seek medical attention immediately by calling 911 or calling your MD immediately  if symptoms less severe.  You Must read complete instructions/literature along with all the possible adverse reactions/side effects for all the Medicines you take and that have been prescribed to you. Take any new Medicines after you have completely understood and accpet all the possible adverse reactions/side effects.   Please note  You were cared for by a hospitalist during your hospital stay. If you have any questions about your discharge medications or the care you received while you were in the hospital after you are discharged, you can call the unit and asked to speak with the hospitalist on call if the hospitalist that took care of you is not  available. Once you are discharged, your primary care physician will handle any further medical issues. Please note that NO REFILLS for any discharge medications will be authorized once you are discharged, as it is imperative that you return to your primary care physician (or establish a relationship with a primary care physician if you do not have one) for your aftercare needs so that they can reassess your need for medications and monitor your lab values.    On the day of Discharge:  VITAL SIGNS:   Blood pressure 137/71, pulse 65, temperature 98.2 F (36.8 C), temperature source Oral, resp. rate 18, height 5\' 3"  (1.6 m), weight 56.7 kg (125 lb), SpO2 95 %.  PHYSICAL EXAMINATION:    GENERAL:  83 y.o.-year-old patient lying in the bed with no acute distress. EYES: Pupils equal, round, reactive to light and accommodation. No scleral icterus. Extraocular muscles intact.  HEENT: Head atraumatic, normocephalic. Oropharynx and nasopharynx clear.  NECK:  Supple,  no jugular venous distention. No thyroid enlargement, no tenderness.  LUNGS: Normal breath sounds bilaterally, no wheezing, rales,rhonchi or crepitation. No use of accessory muscles of respiration. Decreased bibasilar breath sounds CARDIOVASCULAR: S1, S2 normal. No rubs, or gallops. 2/6 systolic murmur is present ABDOMEN: Soft, nontender, nondistended. Bowel sounds present. No organomegaly or mass.  EXTREMITIES: No pedal edema, cyanosis, or clubbing.  NEUROLOGIC: Follows simple commands. Improved expressive aphasia noted. Strength is 5/5 all 4 extremities. Sensation intact. Gait not checked.  PSYCHIATRIC: The patient is alert and oriented 3. Pleasantly Confused intermittently. Follow simple commands.  SKIN: No obvious rash, lesion, or ulcer.   DATA REVIEW:   CBC  Recent Labs Lab 08/05/16 0404  WBC 10.9  HGB 14.2  HCT 42.9  PLT 208    Chemistries   Recent Labs Lab 08/05/16 0404 08/07/16 0452  NA 137 135  K 3.8 4.0  CL  109 107  CO2 21* 23  GLUCOSE 108* 105*  BUN 15 22*  CREATININE 0.71 0.75  CALCIUM 8.1* 8.5*  AST 29  --   ALT 15  --   ALKPHOS 53  --   BILITOT 1.5*  --      Microbiology Results  Results for orders placed or performed during the hospital encounter of 08/04/16  Urine culture     Status: None   Collection Time: 08/04/16 11:59 AM  Result Value Ref Range Status   Specimen Description URINE, CATHETERIZED  Final   Special Requests NONE  Final   Culture   Final    NO GROWTH Performed at [facility], [place] [address], [place], [place] [address]    Report Status 08/06/2016 FINAL  Final  MRSA PCR Screening     Status: None   Collection Time: 08/04/16  7:08 PM  Result Value Ref Range Status   MRSA by PCR NEGATIVE NEGATIVE Final    Comment:        The GeneXpert MRSA Assay (FDA approved for NASAL specimens only), is one component of a comprehensive MRSA colonization surveillance program. It is not intended to diagnose MRSA infection nor to guide or monitor treatment for MRSA infections.     RADIOLOGY:  No results found.   Management plans discussed with the patient, family and they are in agreement.  CODE STATUS:     Code Status Orders        Start     Ordered   08/04/16 1814  Do not attempt resuscitation (DNR)  Continuous    Question Answer Comment  In the event of cardiac or respiratory ARREST Do not call a "code blue"   In the event of cardiac or respiratory ARREST Do not perform Intubation, CPR, defibrillation or ACLS   In the event of cardiac or respiratory ARREST Use medication by any route, position, wound care, and other measures to relive pain and suffering. May use oxygen, suction and manual treatment of airway obstruction as needed for comfort.   Comments RN may pronounce      08/04/16 1813    Code Status History    Date Active Date Inactive Code Status Order ID Comments User Context   03/27/2011  5:54 PM 05/08/2016  8:52 PM DNR [id number]  Crecencio Mc, MD Outpatient    Advance Directive Documentation   Flowsheet Row Most Recent Value  Type of Advance Directive  Out of facility DNR (pink MOST or yellow form)  Pre-existing out of facility DNR order (yellow form or pink MOST form)  Yellow form placed in chart (order not valid for inpatient use)  "MOST" Form in Place?  No data      TOTAL TIME TAKING CARE OF THIS PATIENT: 37 minutes.    Gladstone Lighter M.D on 08/07/2016 at 10:22 AM  Between 7am to 6pm - Pager - [phone]  After 6pm go to [email or web address] - Proofreader  [facility]  Office  [phone]  CC: Primary care physician; Crecencio Mc, MD   Note: This dictation was prepared with Dragon dictation along with smaller phrase technology. Any transcriptional errors that result from this process are unintentional.

## 2016-08-07 NOTE — Progress Notes (Signed)
Patient is medically stable for discharge today to [facility]. Per Seth Bake, admissions coordinator at [facility], patient can come to room 230. Clinical Education officer, museum (CSW) sent discharge orders in the [place]. Social work Theatre manager met with patient and patient's daughter Meredith Mody at bedside explaining that patient will discharge today to [facility]. Patient's daughter verbally agreed she understood. RN will call report and arrange EMS for transport. Please re-consult if future social work need arise. Social work Architectural technologist off.   Danie Chandler, Social Work Intern  [phone]

## 2016-08-08 NOTE — Telephone Encounter (Signed)
Patient discharged to Skill nursing at [facility]  Dr. Silvio Pate will follow TCM to be completed on discharge fron SNF ,[place] to notify office, if discharged.

## 2016-08-09 DIAGNOSIS — F0150 Vascular dementia without behavioral disturbance: Secondary | ICD-10-CM | POA: Diagnosis not present

## 2016-08-09 DIAGNOSIS — I10 Essential (primary) hypertension: Secondary | ICD-10-CM

## 2016-08-09 DIAGNOSIS — I482 Chronic atrial fibrillation: Secondary | ICD-10-CM | POA: Diagnosis not present

## 2016-08-09 DIAGNOSIS — J0110 Acute frontal sinusitis, unspecified: Secondary | ICD-10-CM

## 2016-08-09 DIAGNOSIS — E785 Hyperlipidemia, unspecified: Secondary | ICD-10-CM | POA: Diagnosis not present

## 2016-08-13 ENCOUNTER — Telehealth: Payer: Self-pay

## 2016-08-13 NOTE — Telephone Encounter (Signed)
No message in my book this morning--so problem probably resolved

## 2016-08-13 NOTE — Telephone Encounter (Signed)
Nurse at [facility] paged On Call: Dr. Deborra Medina:   [employer] NOTE:  Client [facility] [place] [facility] Physician Viviana Simpler - MD Contact Type Call Who Is Calling Physician / Provider / Hospital Call Type Provider Call [facility] Page Now Reason for Call Request to speak to Physician Initial Comment Caller states he is an RN at [place] on call. Additional Comment Patient Name Donna Rosario Patient DOB 1922-07-02 Requesting Provider Marcello Moores (RN) Physician Number [phone] Facility Name [facility] Paging DoctorName Phone DateTime Result/Outcome Message Type Notes Arnette Norris - MD [id number] 08/11/2016 8:33:06 PM Paged On Call to Other Provider Doctor Paged Please call Marcello Moores (RN) with [place] at [phone] regarding a page. Arnette Norris - MD 08/11/2016 8:33:14 PM Paged On Call to Another Provider Message Result Call Closed By: Honor Loh Transaction Date/Time: 08/11/2016 8:20:49 PM (ET)

## 2016-08-14 DIAGNOSIS — S60511A Abrasion of right hand, initial encounter: Secondary | ICD-10-CM | POA: Diagnosis not present

## 2016-08-29 ENCOUNTER — Telehealth: Payer: Self-pay | Admitting: Internal Medicine

## 2016-08-29 NOTE — Telephone Encounter (Signed)
Per patient daughter , Dr. Silvio Pate will be following patient due to patient changing providers per [facility] whom notified this office. FYI

## 2016-08-29 NOTE — Telephone Encounter (Signed)
?   Is this TCM appointment

## 2016-08-29 NOTE — Telephone Encounter (Signed)
Verlon Au from [facility] called and needs to scheduled a patient a 2 week post rehab visit for pt. Please advise, thank you!  Call Patti @ [phone]

## 2016-09-24 ENCOUNTER — Other Ambulatory Visit: Payer: Self-pay | Admitting: Internal Medicine

## 2016-09-24 NOTE — Telephone Encounter (Signed)
Please advise, I believe this is your patient. thanks

## 2016-09-25 NOTE — Telephone Encounter (Signed)
Lab Results  Component Value Date   DIGOXIN 0.8 08/04/2016    Refilled.

## 2016-09-25 NOTE — Telephone Encounter (Signed)
I think I only saw her while she was in rehab at [facility]

## 2016-12-12 ENCOUNTER — Non-Acute Institutional Stay: Payer: Medicare Other | Admitting: Internal Medicine

## 2016-12-12 ENCOUNTER — Encounter: Payer: Self-pay | Admitting: Internal Medicine

## 2016-12-12 VITALS — BP 140/80 | HR 72

## 2016-12-12 DIAGNOSIS — I5032 Chronic diastolic (congestive) heart failure: Secondary | ICD-10-CM

## 2016-12-12 DIAGNOSIS — D839 Common variable immunodeficiency, unspecified: Secondary | ICD-10-CM | POA: Diagnosis not present

## 2016-12-12 DIAGNOSIS — R609 Edema, unspecified: Secondary | ICD-10-CM

## 2016-12-12 NOTE — Progress Notes (Signed)
Subjective:    Patient ID: Donna Rosario, female    DOB: 1922-09-02, 83 y.o.   MRN: [id number]  HPI  Asked to see resident RN reports BLE edema, weeping of the right lower leg Resident denies pain, redness or warmth Hx of CHF, diastolic and CVI Currently not on diuretics She has been elevating her legs without much relief She denies shortness of breath  Review of Systems  Past Medical History:  Diagnosis Date  . Arthritis   . Atrial fibrillation [facility]) Sept. 2011   [facility] Admission for RVR  . Chronic low back pain    due to lumbosacral degenerative joint disease  . Disturbance of skin sensation   . Hyperlipidemia   . Hypertension   . Mitral valve disorders(424.0)   . Osteoporosis   . Sciatica   . Thyroid disease    Hypothyroidism  . Vascular dementia, uncomplicated     Current Outpatient Prescriptions  Medication Sig Dispense Refill  . alendronate (FOSAMAX) 70 MG tablet Take 1 tablet (70 mg total) by mouth once a week. Take with a full glass of water on an empty stomach. 12 tablet 0  . ARMOUR THYROID 60 MG tablet TAKE 1 TABLET (60 MG TOTAL) BY MOUTH DAILY. (Patient not taking: Reported on 08/04/2016) 90 tablet 3  . Ascorbic Acid (VITAMIN C) 500 MG tablet Take 500 mg by mouth daily.      Marland Kitchen atorvastatin (LIPITOR) 40 MG tablet Take 1 tablet (40 mg total) by mouth daily at 6 PM. 30 tablet 2  . benzocaine (ANBESOL JR) 10 % mucosal gel Apply to lips as needed for painful blisters 5.3 g 0  . Calcium Carbonate-Vit D-Min (CALCIUM 1200 PO) Take 1 capsule by mouth daily.      . dabigatran (PRADAXA) 150 MG CAPS capsule Take 1 capsule (150 mg total) by mouth 2 (two) times daily. (Patient not taking: Reported on 08/04/2016) 60 capsule 3  . digoxin (LANOXIN) 0.125 MG tablet TAKE 1 TABLET EVERY DAY 90 tablet 0  . ergocalciferol (DRISDOL) 50000 UNITS capsule Take 1 capsule (50,000 Units total) by mouth once a week. 4 capsule 2  . felodipine (PLENDIL) 5 MG 24 hr tablet Take 1 tablet (5 mg  total) by mouth daily. 30 tablet 2  . fish oil-omega-3 fatty acids 1000 MG capsule Take 1 g by mouth daily.      Marland Kitchen L-Lysine 500 MG TABS Take 1 tablet by mouth daily.      Marland Kitchen lisinopril (PRINIVIL,ZESTRIL) 20 MG tablet Take 1 tablet (20 mg total) by mouth at bedtime. 30 tablet 2  . metoprolol (LOPRESSOR) 50 MG tablet Take 1 tablet (50 mg total) by mouth 2 (two) times daily. 180 tablet 3  . risperiDONE (RISPERDAL) 0.25 MG tablet Take 1 tablet (0.25 mg total) by mouth 2 (two) times daily as needed (confusion). 30 tablet 1  . Selenium 200 MCG TABS Take 1 tablet by mouth daily.      . traZODone (DESYREL) 50 MG tablet TAKE 1.5 TABLETS (75 MG TOTAL) BY MOUTH AT BEDTIME. (Patient not taking: Reported on 08/04/2016) 135 tablet 1   No current facility-administered medications for this visit.     Allergies  Allergen Reactions  . Sulfonamide Derivatives     Family History  Problem Relation Age of Onset  . Other Mother        CHF  . Osteoporosis Mother   . Diabetes Mother   . Other Father        unknown  .  Osteoporosis Father   . Heart disease Father     Social History   Social History  . Marital status: Married    Spouse name: N/A  . Number of children: N/A  . Years of education: N/A   Occupational History  . Not on file.   Social History Main Topics  . Smoking status: Never Smoker  . Smokeless tobacco: Never Used  . Alcohol use 3.0 oz/week    5 Glasses of wine per week     Comment: occasional  . Drug use: No  . Sexual activity: No   Other Topics Concern  . Not on file   Social History Narrative  . No narrative on file     Constitutional: Denies fever, malaise, fatigue, headache or abrupt weight changes.  Respiratory: Denies difficulty breathing, shortness of breath, cough or sputum production.   Cardiovascular: Pt reports swelling of BLE. Denies chest pain, chest tightness, palpitations or swelling in the hands.  Skin: Denies redness, rashes, lesions or ulcercations.     No other specific complaints in a complete review of systems (except as listed in HPI above).     Objective:   Physical Exam    BP 140/80   Pulse 72  Wt Readings from Last 3 Encounters:  08/04/16 125 lb (56.7 kg)  08/03/16 124 lb (56.2 kg)  06/06/16 121 lb 1.9 oz (54.9 kg)    General: Appears her stated age, in NAD. Skin: Vascular changes noted of BLE, no obvious open area or ulceration. Cardiovascular: Normal rate and rhythm. S1,S2 noted.  No murmur, rubs or gallops noted. 1+ pitting BLE edema noted. Pulmonary/Chest: Normal effort and positive vesicular breath sounds. No respiratory distress. No wheezes, rales or ronchi noted.  Musculoskeletal: Gait slow but steady.  BMET    Component Value Date/Time   NA 135 08/07/2016 0452   NA 141 02/24/2015 1041   NA 142 06/08/2014 1940   K 4.0 08/07/2016 0452   K 3.9 06/08/2014 1940   CL 107 08/07/2016 0452   CL 110 (H) 06/08/2014 1940   CO2 23 08/07/2016 0452   CO2 26 06/08/2014 1940   GLUCOSE 105 (H) 08/07/2016 0452   GLUCOSE 105 (H) 06/08/2014 1940   BUN 22 (H) 08/07/2016 0452   BUN 23 02/24/2015 1041   BUN 22 (H) 06/08/2014 1940   CREATININE 0.75 08/07/2016 0452   CREATININE 0.95 06/08/2014 1940   CREATININE 0.95 01/30/2013 1628   CALCIUM 8.5 (L) 08/07/2016 0452   CALCIUM 9.0 06/08/2014 1940   GFRNONAA >60 08/07/2016 0452   GFRNONAA 59 (L) 06/08/2014 1940   GFRAA >60 08/07/2016 0452   GFRAA >60 06/08/2014 1940    Lipid Panel     Component Value Date/Time   CHOL 215 (H) 01/12/2014 1400   TRIG 165.0 (H) 01/12/2014 1400   HDL 54.70 01/12/2014 1400   CHOLHDL 4 01/12/2014 1400   VLDL 33.0 01/12/2014 1400   LDLCALC 127 (H) 01/12/2014 1400    CBC    Component Value Date/Time   WBC 10.9 08/05/2016 0404   RBC 4.81 08/05/2016 0404   HGB 14.2 08/05/2016 0404   HGB 14.0 06/08/2014 1940   HCT 42.9 08/05/2016 0404   HCT 43.1 06/08/2014 1940   PLT 208 08/05/2016 0404   PLT 235 06/08/2014 1940   MCV 89.3  08/05/2016 0404   MCV 90 06/08/2014 1940   MCH 29.5 08/05/2016 0404   MCHC 33.1 08/05/2016 0404   RDW 15.0 (H) 08/05/2016 0404   RDW 14.4  06/08/2014 1940   LYMPHSABS 1.6 08/04/2016 1104   MONOABS 1.0 (H) 08/04/2016 1104   EOSABS 0.0 08/04/2016 1104   BASOSABS 0.1 08/04/2016 1104    Hgb A1C No results found for: HGBA1C          Assessment & Plan:   Weeping Edema BLE:  Lasix 20 mg daily x 5 days Encouraged elevation Start wearing TED hose  Will reassess as needed Webb Silversmith, NP

## 2016-12-12 NOTE — Patient Instructions (Signed)
Edema Edema is when you have too much fluid in your body or under your skin. Edema may make your legs, feet, and ankles swell up. Swelling is also common in looser tissues, like around your eyes. This is a common condition. It gets more common as you get older. There are many possible causes of edema. Eating too much salt (sodium) and being on your feet or sitting for a long time can cause edema in your legs, feet, and ankles. Hot weather may make edema worse. Edema is usually painless. Your skin may look swollen or shiny. Follow these instructions at home:  Keep the swollen body part raised (elevated) above the level of your heart when you are sitting or lying down.  Do not sit still or stand for a long time.  Do not wear tight clothes. Do not wear garters on your upper legs.  Exercise your legs. This can help the swelling go down.  Wear elastic bandages or support stockings as told by your doctor.  Eat a low-salt (low-sodium) diet to reduce fluid as told by your doctor.  Depending on the cause of your swelling, you may need to limit how much fluid you drink (fluid restriction).  Take over-the-counter and prescription medicines only as told by your doctor. Contact a doctor if:  Treatment is not working.  You have heart, liver, or kidney disease and have symptoms of edema.  You have sudden and unexplained weight gain. Get help right away if:  You have shortness of breath or chest pain.  You cannot breathe when you lie down.  You have pain, redness, or warmth in the swollen areas.  You have heart, liver, or kidney disease and get edema all of a sudden.  You have a fever and your symptoms get worse all of a sudden. Summary  Edema is when you have too much fluid in your body or under your skin.  Edema may make your legs, feet, and ankles swell up. Swelling is also common in looser tissues, like around your eyes.  Raise (elevate) the swollen body part above the level of your  heart when you are sitting or lying down.  Follow your doctor's instructions about diet and how much fluid you can drink (fluid restriction). This information is not intended to replace advice given to you by your health care provider. Make sure you discuss any questions you have with your health care provider. Document Released: 11/28/2007 Document Revised: 06/29/2016 Document Reviewed: 06/29/2016 Elsevier Interactive Patient Education  2017 [employer]  

## 2016-12-19 ENCOUNTER — Non-Acute Institutional Stay: Payer: Medicare Other | Admitting: Internal Medicine

## 2016-12-19 VITALS — BP 118/60 | HR 64 | Resp 20

## 2016-12-19 DIAGNOSIS — M7989 Other specified soft tissue disorders: Secondary | ICD-10-CM

## 2016-12-19 DIAGNOSIS — M79661 Pain in right lower leg: Secondary | ICD-10-CM

## 2016-12-20 ENCOUNTER — Encounter: Payer: Self-pay | Admitting: Internal Medicine

## 2016-12-20 NOTE — Patient Instructions (Signed)
Edema Edema is when you have too much fluid in your body or under your skin. Edema may make your legs, feet, and ankles swell up. Swelling is also common in looser tissues, like around your eyes. This is a common condition. It gets more common as you get older. There are many possible causes of edema. Eating too much salt (sodium) and being on your feet or sitting for a long time can cause edema in your legs, feet, and ankles. Hot weather may make edema worse. Edema is usually painless. Your skin may look swollen or shiny. Follow these instructions at home:  Keep the swollen body part raised (elevated) above the level of your heart when you are sitting or lying down.  Do not sit still or stand for a long time.  Do not wear tight clothes. Do not wear garters on your upper legs.  Exercise your legs. This can help the swelling go down.  Wear elastic bandages or support stockings as told by your doctor.  Eat a low-salt (low-sodium) diet to reduce fluid as told by your doctor.  Depending on the cause of your swelling, you may need to limit how much fluid you drink (fluid restriction).  Take over-the-counter and prescription medicines only as told by your doctor. Contact a doctor if:  Treatment is not working.  You have heart, liver, or kidney disease and have symptoms of edema.  You have sudden and unexplained weight gain. Get help right away if:  You have shortness of breath or chest pain.  You cannot breathe when you lie down.  You have pain, redness, or warmth in the swollen areas.  You have heart, liver, or kidney disease and get edema all of a sudden.  You have a fever and your symptoms get worse all of a sudden. Summary  Edema is when you have too much fluid in your body or under your skin.  Edema may make your legs, feet, and ankles swell up. Swelling is also common in looser tissues, like around your eyes.  Raise (elevate) the swollen body part above the level of your  heart when you are sitting or lying down.  Follow your doctor's instructions about diet and how much fluid you can drink (fluid restriction). This information is not intended to replace advice given to you by your health care provider. Make sure you discuss any questions you have with your health care provider. Document Released: 11/28/2007 Document Revised: 06/29/2016 Document Reviewed: 06/29/2016 Elsevier Interactive Patient Education  2017 [employer]  

## 2016-12-20 NOTE — Progress Notes (Signed)
Subjective:    Patient ID: Donna Rosario, female    DOB: [birth year], 83 y.o.   MRN: [id number]  HPI  Asked to follow up with resident in ALF Ongoing swelling of BLE, R>L Persistent weeping of RLE Some improvement with Lasix x 5 days, elevation and TED hose No SOB  Review of Systems      Past Medical History:  Diagnosis Date  . Arthritis   . Atrial fibrillation [facility]) Sept. 2011   [facility] Admission for RVR  . Chronic low back pain    due to lumbosacral degenerative joint disease  . Disturbance of skin sensation   . Hyperlipidemia   . Hypertension   . Mitral valve disorders(424.0)   . Osteoporosis   . Sciatica   . Thyroid disease    Hypothyroidism  . Vascular dementia, uncomplicated     Current Outpatient Prescriptions  Medication Sig Dispense Refill  . alendronate (FOSAMAX) 70 MG tablet Take 1 tablet (70 mg total) by mouth once a week. Take with a full glass of water on an empty stomach. 12 tablet 0  . ARMOUR THYROID 60 MG tablet TAKE 1 TABLET (60 MG TOTAL) BY MOUTH DAILY. (Patient not taking: Reported on 08/04/2016) 90 tablet 3  . Ascorbic Acid (VITAMIN C) 500 MG tablet Take 500 mg by mouth daily.      Marland Kitchen atorvastatin (LIPITOR) 40 MG tablet Take 1 tablet (40 mg total) by mouth daily at 6 PM. 30 tablet 2  . benzocaine (ANBESOL JR) 10 % mucosal gel Apply to lips as needed for painful blisters 5.3 g 0  . Calcium Carbonate-Vit D-Min (CALCIUM 1200 PO) Take 1 capsule by mouth daily.      . dabigatran (PRADAXA) 150 MG CAPS capsule Take 1 capsule (150 mg total) by mouth 2 (two) times daily. (Patient not taking: Reported on 08/04/2016) 60 capsule 3  . digoxin (LANOXIN) 0.125 MG tablet TAKE 1 TABLET EVERY DAY 90 tablet 0  . ergocalciferol (DRISDOL) 50000 UNITS capsule Take 1 capsule (50,000 Units total) by mouth once a week. 4 capsule 2  . felodipine (PLENDIL) 5 MG 24 hr tablet Take 1 tablet (5 mg total) by mouth daily. 30 tablet 2  . fish oil-omega-3 fatty acids 1000 MG capsule Take 1  g by mouth daily.      Marland Kitchen L-Lysine 500 MG TABS Take 1 tablet by mouth daily.      Marland Kitchen lisinopril (PRINIVIL,ZESTRIL) 20 MG tablet Take 1 tablet (20 mg total) by mouth at bedtime. 30 tablet 2  . metoprolol (LOPRESSOR) 50 MG tablet Take 1 tablet (50 mg total) by mouth 2 (two) times daily. 180 tablet 3  . risperiDONE (RISPERDAL) 0.25 MG tablet Take 1 tablet (0.25 mg total) by mouth 2 (two) times daily as needed (confusion). 30 tablet 1  . Selenium 200 MCG TABS Take 1 tablet by mouth daily.      . traZODone (DESYREL) 50 MG tablet TAKE 1.5 TABLETS (75 MG TOTAL) BY MOUTH AT BEDTIME. (Patient not taking: Reported on 08/04/2016) 135 tablet 1   No current facility-administered medications for this visit.     Allergies  Allergen Reactions  . Sulfonamide Derivatives     Family History  Problem Relation Age of Onset  . Other Mother        CHF  . Osteoporosis Mother   . Diabetes Mother   . Other Father        unknown  . Osteoporosis Father   . Heart disease Father  Social History   Social History  . Marital status: Married    Spouse name: N/A  . Number of children: N/A  . Years of education: N/A   Occupational History  . Not on file.   Social History Main Topics  . Smoking status: Never Smoker  . Smokeless tobacco: Never Used  . Alcohol use 3.0 oz/week    5 Glasses of wine per week     Comment: occasional  . Drug use: No  . Sexual activity: No   Other Topics Concern  . Not on file   Social History Narrative  . No narrative on file     Constitutional: Denies fever, malaise, fatigue, headache or abrupt weight changes.  Respiratory: Denies difficulty breathing, shortness of breath, cough or sputum production.   Cardiovascular: Pt reports swelling of BLE, R>L. Denies chest pain, chest tightness, palpitations or swelling in the handst.  Skin: Pt reports weeping of RLE.     No other specific complaints in a complete review of systems (except as listed in HPI  above).  Objective:   Physical Exam   BP 118/60   Pulse 64   Resp 20  Wt Readings from Last 3 Encounters:  08/04/16 125 lb (56.7 kg)  08/03/16 124 lb (56.2 kg)  06/06/16 121 lb 1.9 oz (54.9 kg)    General: Appears her stated age,  in NAD. Skin: Warm, dry and intact. Dressing noted to RLE, no evidence of drainage noted. Cardiovascular: Trace edema of LLE. 2+ pitting edema of RLE. Musculoskeletal: Gait slow but steady, she was not using a walker during this assessment.    BMET    Component Value Date/Time   NA 135 08/07/2016 0452   NA 141 02/24/2015 1041   NA 142 06/08/2014 1940   K 4.0 08/07/2016 0452   K 3.9 06/08/2014 1940   CL 107 08/07/2016 0452   CL 110 (H) 06/08/2014 1940   CO2 23 08/07/2016 0452   CO2 26 06/08/2014 1940   GLUCOSE 105 (H) 08/07/2016 0452   GLUCOSE 105 (H) 06/08/2014 1940   BUN 22 (H) 08/07/2016 0452   BUN 23 02/24/2015 1041   BUN 22 (H) 06/08/2014 1940   CREATININE 0.75 08/07/2016 0452   CREATININE 0.95 06/08/2014 1940   CREATININE 0.95 01/30/2013 1628   CALCIUM 8.5 (L) 08/07/2016 0452   CALCIUM 9.0 06/08/2014 1940   GFRNONAA >60 08/07/2016 0452   GFRNONAA 59 (L) 06/08/2014 1940   GFRAA >60 08/07/2016 0452   GFRAA >60 06/08/2014 1940    Lipid Panel     Component Value Date/Time   CHOL 215 (H) 01/12/2014 1400   TRIG 165.0 (H) 01/12/2014 1400   HDL 54.70 01/12/2014 1400   CHOLHDL 4 01/12/2014 1400   VLDL 33.0 01/12/2014 1400   LDLCALC 127 (H) 01/12/2014 1400    CBC    Component Value Date/Time   WBC 10.9 08/05/2016 0404   RBC 4.81 08/05/2016 0404   HGB 14.2 08/05/2016 0404   HGB 14.0 06/08/2014 1940   HCT 42.9 08/05/2016 0404   HCT 43.1 06/08/2014 1940   PLT 208 08/05/2016 0404   PLT 235 06/08/2014 1940   MCV 89.3 08/05/2016 0404   MCV 90 06/08/2014 1940   MCH 29.5 08/05/2016 0404   MCHC 33.1 08/05/2016 0404   RDW 15.0 (H) 08/05/2016 0404   RDW 14.4 06/08/2014 1940   LYMPHSABS 1.6 08/04/2016 1104   MONOABS 1.0 (H)  08/04/2016 1104   EOSABS 0.0 08/04/2016 1104   BASOSABS  0.1 08/04/2016 1104    Hgb A1C No results found for: HGBA1C         Assessment & Plan:   Swelling of RLE:  Venous doppler of RLE If negative, will start daily Lasix Continue dressing changes to RLE daily Continue TED hose and elevation  Will follow up with RN after results of venous doppler Webb Silversmith, NP

## 2017-01-02 ENCOUNTER — Non-Acute Institutional Stay: Payer: Medicare Other | Admitting: Internal Medicine

## 2017-01-02 VITALS — BP 118/60 | HR 64 | Resp 20

## 2017-01-02 DIAGNOSIS — R600 Localized edema: Secondary | ICD-10-CM | POA: Diagnosis not present

## 2017-01-02 DIAGNOSIS — B351 Tinea unguium: Secondary | ICD-10-CM

## 2017-01-03 ENCOUNTER — Encounter: Payer: Self-pay | Admitting: Internal Medicine

## 2017-01-03 NOTE — Progress Notes (Signed)
Subjective:    Patient ID: Donna Rosario, female    DOB: 03-Dec-1922, 83 y.o.   MRN: [id number]  HPI  Asked to see resident in ALF. RN reports RLE swelling has improved. No longer weeping but still swollen.  Resident wants to know what else can be done about the swelling She is on Lasix daily and wearing TED hose Also requesting nail trim Nails thick, causing pain when wearing shoes.  Review of Systems  Past Medical History:  Diagnosis Date  . Arthritis   . Atrial fibrillation [facility]) Sept. 2011   [facility] Admission for RVR  . Chronic low back pain    due to lumbosacral degenerative joint disease  . Disturbance of skin sensation   . Hyperlipidemia   . Hypertension   . Mitral valve disorders(424.0)   . Osteoporosis   . Sciatica   . Thyroid disease    Hypothyroidism  . Vascular dementia, uncomplicated     Current Outpatient Prescriptions  Medication Sig Dispense Refill  . alendronate (FOSAMAX) 70 MG tablet Take 1 tablet (70 mg total) by mouth once a week. Take with a full glass of water on an empty stomach. 12 tablet 0  . ARMOUR THYROID 60 MG tablet TAKE 1 TABLET (60 MG TOTAL) BY MOUTH DAILY. (Patient not taking: Reported on 08/04/2016) 90 tablet 3  . Ascorbic Acid (VITAMIN C) 500 MG tablet Take 500 mg by mouth daily.      Marland Kitchen atorvastatin (LIPITOR) 40 MG tablet Take 1 tablet (40 mg total) by mouth daily at 6 PM. 30 tablet 2  . benzocaine (ANBESOL JR) 10 % mucosal gel Apply to lips as needed for painful blisters 5.3 g 0  . Calcium Carbonate-Vit D-Min (CALCIUM 1200 PO) Take 1 capsule by mouth daily.      . dabigatran (PRADAXA) 150 MG CAPS capsule Take 1 capsule (150 mg total) by mouth 2 (two) times daily. (Patient not taking: Reported on 08/04/2016) 60 capsule 3  . digoxin (LANOXIN) 0.125 MG tablet TAKE 1 TABLET EVERY DAY 90 tablet 0  . ergocalciferol (DRISDOL) 50000 UNITS capsule Take 1 capsule (50,000 Units total) by mouth once a week. 4 capsule 2  . felodipine (PLENDIL) 5 MG 24 hr  tablet Take 1 tablet (5 mg total) by mouth daily. 30 tablet 2  . fish oil-omega-3 fatty acids 1000 MG capsule Take 1 g by mouth daily.      Marland Kitchen L-Lysine 500 MG TABS Take 1 tablet by mouth daily.      Marland Kitchen lisinopril (PRINIVIL,ZESTRIL) 20 MG tablet Take 1 tablet (20 mg total) by mouth at bedtime. 30 tablet 2  . metoprolol (LOPRESSOR) 50 MG tablet Take 1 tablet (50 mg total) by mouth 2 (two) times daily. 180 tablet 3  . risperiDONE (RISPERDAL) 0.25 MG tablet Take 1 tablet (0.25 mg total) by mouth 2 (two) times daily as needed (confusion). 30 tablet 1  . Selenium 200 MCG TABS Take 1 tablet by mouth daily.      . traZODone (DESYREL) 50 MG tablet TAKE 1.5 TABLETS (75 MG TOTAL) BY MOUTH AT BEDTIME. (Patient not taking: Reported on 08/04/2016) 135 tablet 1   No current facility-administered medications for this visit.     Allergies  Allergen Reactions  . Sulfonamide Derivatives     Family History  Problem Relation Age of Onset  . Other Mother        CHF  . Osteoporosis Mother   . Diabetes Mother   . Other Father  unknown  . Osteoporosis Father   . Heart disease Father     Social History   Social History  . Marital status: Married    Spouse name: N/A  . Number of children: N/A  . Years of education: N/A   Occupational History  . Not on file.   Social History Main Topics  . Smoking status: Never Smoker  . Smokeless tobacco: Never Used  . Alcohol use 3.0 oz/week    5 Glasses of wine per week     Comment: occasional  . Drug use: No  . Sexual activity: No   Other Topics Concern  . Not on file   Social History Narrative  . No narrative on file     Constitutional: Denies fever, malaise, fatigue, headache or abrupt weight changes.  Cardiovascular: Pt reports swelling of RLE. Denies chest pain, chest tightness, palpitations or swelling in the hands.   Skin: Pt reports thick, long discolored toenails. Denies redness, rashes, lesions or ulcercations.    No other specific  complaints in a complete review of systems (except as listed in HPI above).     Objective:   Physical Exam  BP 118/60   Pulse 64   Resp 20  Wt Readings from Last 3 Encounters:  08/04/16 125 lb (56.7 kg)  08/03/16 124 lb (56.2 kg)  06/06/16 121 lb 1.9 oz (54.9 kg)    General: Appears her stated age, in NAD. Skin: Mycotic toenail noted bilaterally. Cardiovascular: 1+ pitting edema RLE. No redness or warmth noted.   BMET    Component Value Date/Time   NA 135 08/07/2016 0452   NA 141 02/24/2015 1041   NA 142 06/08/2014 1940   K 4.0 08/07/2016 0452   K 3.9 06/08/2014 1940   CL 107 08/07/2016 0452   CL 110 (H) 06/08/2014 1940   CO2 23 08/07/2016 0452   CO2 26 06/08/2014 1940   GLUCOSE 105 (H) 08/07/2016 0452   GLUCOSE 105 (H) 06/08/2014 1940   BUN 22 (H) 08/07/2016 0452   BUN 23 02/24/2015 1041   BUN 22 (H) 06/08/2014 1940   CREATININE 0.75 08/07/2016 0452   CREATININE 0.95 06/08/2014 1940   CREATININE 0.95 01/30/2013 1628   CALCIUM 8.5 (L) 08/07/2016 0452   CALCIUM 9.0 06/08/2014 1940   GFRNONAA >60 08/07/2016 0452   GFRNONAA 59 (L) 06/08/2014 1940   GFRAA >60 08/07/2016 0452   GFRAA >60 06/08/2014 1940    Lipid Panel     Component Value Date/Time   CHOL 215 (H) 01/12/2014 1400   TRIG 165.0 (H) 01/12/2014 1400   HDL 54.70 01/12/2014 1400   CHOLHDL 4 01/12/2014 1400   VLDL 33.0 01/12/2014 1400   LDLCALC 127 (H) 01/12/2014 1400    CBC    Component Value Date/Time   WBC 10.9 08/05/2016 0404   RBC 4.81 08/05/2016 0404   HGB 14.2 08/05/2016 0404   HGB 14.0 06/08/2014 1940   HCT 42.9 08/05/2016 0404   HCT 43.1 06/08/2014 1940   PLT 208 08/05/2016 0404   PLT 235 06/08/2014 1940   MCV 89.3 08/05/2016 0404   MCV 90 06/08/2014 1940   MCH 29.5 08/05/2016 0404   MCHC 33.1 08/05/2016 0404   RDW 15.0 (H) 08/05/2016 0404   RDW 14.4 06/08/2014 1940   LYMPHSABS 1.6 08/04/2016 1104   MONOABS 1.0 (H) 08/04/2016 1104   EOSABS 0.0 08/04/2016 1104   BASOSABS 0.1  08/04/2016 1104    Hgb A1C No results found for: HGBA1C  Assessment & Plan:   Mycotic Toenails:  Trimmed by provider using dremmel tool  Edema:  Encouraged elevation and TED hose BMET today If creat and potassium normal, will increase Lasix to 40 mg daily  Will reassess as needed Webb Silversmith, NP

## 2017-01-03 NOTE — Patient Instructions (Signed)

## 2017-01-10 ENCOUNTER — Encounter: Payer: Self-pay | Admitting: Internal Medicine

## 2017-01-10 ENCOUNTER — Non-Acute Institutional Stay: Payer: Medicare Other | Admitting: Internal Medicine

## 2017-01-10 VITALS — BP 130/64 | HR 76 | Temp 98.1°F | Resp 20 | Wt 126.2 lb

## 2017-01-10 DIAGNOSIS — I482 Chronic atrial fibrillation, unspecified: Secondary | ICD-10-CM

## 2017-01-10 DIAGNOSIS — I872 Venous insufficiency (chronic) (peripheral): Secondary | ICD-10-CM

## 2017-01-10 DIAGNOSIS — F0150 Vascular dementia without behavioral disturbance: Secondary | ICD-10-CM

## 2017-01-10 DIAGNOSIS — I5032 Chronic diastolic (congestive) heart failure: Secondary | ICD-10-CM | POA: Diagnosis not present

## 2017-01-10 MED ORDER — APIXABAN 5 MG PO TABS: 5.0000 mg | ORAL_TABLET | Freq: Two times a day (BID) | ORAL | 11 refills | Status: AC

## 2017-01-10 NOTE — Assessment & Plan Note (Signed)
Will see if eliquis is covered better Rate is fine

## 2017-01-10 NOTE — Assessment & Plan Note (Signed)
Probably mostly the cause of her edema

## 2017-01-10 NOTE — Assessment & Plan Note (Signed)
Probably vascular---?worse since embolic stroke in February?

## 2017-01-10 NOTE — Assessment & Plan Note (Signed)
Mild edema still Adjusting furosemide Some weeping in legs---local Rx No SOB, etc

## 2017-01-10 NOTE — Progress Notes (Signed)
Subjective:    Patient ID: Donna Rosario, female    DOB: 1922/12/04, 83 y.o.   MRN: [id number]  HPI Here for follow up after hospitalization for embolic stroke and rehab stay Moved to [place] and now ha asked for me to provide care Visit in her apartment  No clear residual from the  [facility] with rollator (not always in apartment) Aide in morning to assist with shower and dressing Bathroom independently--- no incontinence  Has the atrial fibrillation--occasionally has mild palpitations No chest pain No SOB Some ongoing edema---despite the furosemide Has some weeping in right leg  Current Outpatient Prescriptions on File Prior to Visit  Medication Sig Dispense Refill  . alendronate (FOSAMAX) 70 MG tablet Take 1 tablet (70 mg total) by mouth once a week. Take with a full glass of water on an empty stomach. 12 tablet 0  . ARMOUR THYROID 60 MG tablet TAKE 1 TABLET (60 MG TOTAL) BY MOUTH DAILY. 90 tablet 3  . Ascorbic Acid (VITAMIN C) 500 MG tablet Take 500 mg by mouth daily.      Marland Kitchen atorvastatin (LIPITOR) 40 MG tablet Take 1 tablet (40 mg total) by mouth daily at 6 PM. 30 tablet 2  . benzocaine (ANBESOL JR) 10 % mucosal gel Apply to lips as needed for painful blisters 5.3 g 0  . Calcium Carbonate-Vit D-Min (CALCIUM 1200 PO) Take 1 capsule by mouth daily.      . dabigatran (PRADAXA) 150 MG CAPS capsule Take 1 capsule (150 mg total) by mouth 2 (two) times daily. 60 capsule 3  . digoxin (LANOXIN) 0.125 MG tablet TAKE 1 TABLET EVERY DAY 90 tablet 0  . ergocalciferol (DRISDOL) 50000 UNITS capsule Take 1 capsule (50,000 Units total) by mouth once a week. 4 capsule 2  . felodipine (PLENDIL) 5 MG 24 hr tablet Take 1 tablet (5 mg total) by mouth daily. 30 tablet 2  . fish oil-omega-3 fatty acids 1000 MG capsule Take 1 g by mouth daily.      Marland Kitchen L-Lysine 500 MG TABS Take 1 tablet by mouth daily.      Marland Kitchen lisinopril (PRINIVIL,ZESTRIL) 20 MG tablet Take 1 tablet (20 mg total) by mouth at bedtime. 30 tablet 2    . metoprolol (LOPRESSOR) 50 MG tablet Take 1 tablet (50 mg total) by mouth 2 (two) times daily. 180 tablet 3  . Selenium 200 MCG TABS Take 1 tablet by mouth daily.       No current facility-administered medications on file prior to visit.     Allergies  Allergen Reactions  . Sulfonamide Derivatives     Past Medical History:  Diagnosis Date  . Arthritis   . Atrial fibrillation [facility]) Sept. 2011   [facility] Admission for RVR  . Chronic low back pain    due to lumbosacral degenerative joint disease  . Disturbance of skin sensation   . Embolic stroke ([place]) 41/2878  . Hyperlipidemia   . Hypertension   . Mitral valve disorders(424.0)   . Osteoporosis   . Sciatica   . Thyroid disease    Hypothyroidism  . Vascular dementia, uncomplicated     Past Surgical History:  Procedure Laterality Date  . JOINT REPLACEMENT     Right Hip Oct 2011,  Left Knee 2008 ([place])  . KNEE SURGERY     Left knee  . THYROIDECTOMY    . TOTAL HIP ARTHROPLASTY  2011    Family History  Problem Relation Age of Onset  . Other Mother  CHF  . Osteoporosis Mother   . Diabetes Mother   . Other Father        unknown  . Osteoporosis Father   . Heart disease Father     Social History   Social History  . Marital status: Widowed    Spouse name: N/A  . Number of children: 2  . Years of education: N/A   Occupational History  . Professional artist--taught art    Social History Main Topics  . Smoking status: Never Smoker  . Smokeless tobacco: Never Used  . Alcohol use 3.0 oz/week    5 Glasses of wine per week     Comment: occasional  . Drug use: No  . Sexual activity: No   Other Topics Concern  . Not on file   Social History Narrative   Has living will   Son or daughter should make health care decisions   Will accept resuscitation   Review of Systems No apparent memory problems Appetite is good Weight is stable Sleeps okay most of the --sleeps flat without PND Bowels are fine     Objective:   Physical Exam  Constitutional: She appears well-nourished. No distress.  Neck: No thyromegaly present.  Cardiovascular: Normal rate.   Irregular Gr 3/6 systolic murmur mostly at apex  Pulmonary/Chest: Effort normal and breath sounds normal. No respiratory distress. She has no wheezes. She has no rales.  Musculoskeletal:  1+ edema  Lymphadenopathy:    She has no cervical adenopathy.  Neurological:  July, '18 Doesn't remember [facility] "I live here and really like it"  Psychiatric: Her behavior is normal.          Assessment & Plan:

## 2017-02-03 NOTE — Progress Notes (Deleted)
Cardiology Office Note  Date:  02/03/2017   ID:  Donna Rosario, DOB 1923-05-31, MRN [id number]  PCP:  Donna Mc, MD   No chief complaint on file.   HPI:  Donna Rosario is a 83 year old woman with history of   sciatic and back pain, "crushed right hip", s/p right hip replacement,  hospital in early September 2011 with atrial fibrillation, started on warfarin and rate control, underlying mild dementia and confusion with polypharmacy, taken off warfarin secondary to inability to manage this herself,  previously started on Pradaxa b.i.d.  We did have 2  nursing agencies try to establish a relationship with her for polypharmacy. She  discharged them.  Mild chronic lower extremity edema. Significant confusion in the past  with her medications. She presents today for follow-up of her atrial fibrillation  In follow-up today, she reports that she is doing relatively well She does have some Leg weakness She has had falls, details unclear Gets dizzy at times, when she does too much reading, has to look up Also has dizziness when she is standing and she looks up for too long Considers herself a Swimmer , goes swimming 1 x per week Reports doing exercise 5 days per week, details unclear Currently lives at twin [place]  requesting pradaxa samples, as on her last clinic visit. Reports it is expensive Reports she has no family in town to help take care of her  EKG on today's visit shows atrial fibrillation with rate 76 bpm no significant ST or T-wave changes  Other past medical history In the past, she drank a significant amount of soda/Pepsi.  Previous echocardiogram shows normal systolic function, ejection fraction 55%, mild LVH with diastolic dysfunction, mildly dilated left atrium, mild to moderate MR, moderate TR with elevated right ventricular systolic pressures consistent with mild pulmonary hypertension  PMH:   has a past medical history of Arthritis; Atrial fibrillation [facility]) (Sept.  2011); Chronic low back pain; Disturbance of skin sensation; Embolic stroke ([place]) (70/1779); Hyperlipidemia; Hypertension; Mitral valve disorders(424.0); Osteoporosis; Sciatica; Thyroid disease; and Vascular dementia, uncomplicated.  PSH:    Past Surgical History:  Procedure Laterality Date  . JOINT REPLACEMENT     Right Hip Oct 2011,  Left Knee 2008 ([facility])  . KNEE SURGERY     Left knee  . THYROIDECTOMY    . TOTAL HIP ARTHROPLASTY  2011    Current Outpatient Prescriptions  Medication Sig Dispense Refill  . alendronate (FOSAMAX) 70 MG tablet Take 1 tablet (70 mg total) by mouth once a week. Take with a full glass of water on an empty stomach. 12 tablet 0  . apixaban (ELIQUIS) 5 MG TABS tablet Take 1 tablet (5 mg total) by mouth 2 (two) times daily. 60 tablet 11  . ARMOUR THYROID 60 MG tablet TAKE 1 TABLET (60 MG TOTAL) BY MOUTH DAILY. 90 tablet 3  . Ascorbic Acid (VITAMIN C) 500 MG tablet Take 500 mg by mouth daily.      Marland Kitchen atorvastatin (LIPITOR) 40 MG tablet Take 1 tablet (40 mg total) by mouth daily at 6 PM. 30 tablet 2  . benzocaine (ANBESOL JR) 10 % mucosal gel Apply to lips as needed for painful blisters 5.3 g 0  . Calcium Carbonate-Vit D-Min (CALCIUM 1200 PO) Take 1 capsule by mouth daily.      . digoxin (LANOXIN) 0.125 MG tablet TAKE 1 TABLET EVERY DAY 90 tablet 0  . ergocalciferol (DRISDOL) 50000 UNITS capsule Take 1 capsule (50,000 Units total) by mouth  once a week. 4 capsule 2  . felodipine (PLENDIL) 5 MG 24 hr tablet Take 1 tablet (5 mg total) by mouth daily. 30 tablet 2  . fish oil-omega-3 fatty acids 1000 MG capsule Take 1 g by mouth daily.      . furosemide (LASIX) 40 MG tablet Take 40 mg by mouth daily.     Marland Kitchen L-Lysine 500 MG TABS Take 1 tablet by mouth daily.      Marland Kitchen lisinopril (PRINIVIL,ZESTRIL) 20 MG tablet Take 1 tablet (20 mg total) by mouth at bedtime. 30 tablet 2  . metoprolol (LOPRESSOR) 50 MG tablet Take 1 tablet (50 mg total) by mouth 2 (two) times daily. 180  tablet 3  . Selenium 200 MCG TABS Take 1 tablet by mouth daily.       No current facility-administered medications for this visit.      Allergies:   Sulfonamide derivatives   Social History:  The patient  reports that she has never smoked. She has never used smokeless tobacco. She reports that she drinks about 3.0 oz of alcohol per week . She reports that she does not use drugs.   Family History:   family history includes Diabetes in her mother; Heart disease in her father; Osteoporosis in her father and mother; Other in her father and mother.    Review of Systems: Review of Systems  Constitutional: Negative.   Respiratory: Negative.   Cardiovascular: Positive for leg swelling.  Gastrointestinal: Negative.   Musculoskeletal: Negative.   Neurological: Positive for dizziness.  Psychiatric/Behavioral: Negative.   All other systems reviewed and are negative.    PHYSICAL EXAM: VS:  There were no vitals taken for this visit. , BMI There is no height or weight on file to calculate BMI. GEN: Well nourished, well developed, in no acute distress  HEENT: normal  Neck: no JVD, carotid bruits, or masses Cardiac: irreg irreg,  2+ RSB SEM, no rubs, or gallops, minimal B/L pitting  edema  Respiratory:  clear to auscultation bilaterally, normal work of breathing GI: soft, nontender, nondistended, + BS MS: no deformity or atrophy  Skin: warm and dry, no rash Neuro:  Strength and sensation are intact Psych: euthymic mood, full affect    Recent Labs: 08/05/2016: ALT 15; Hemoglobin 14.2; Platelets 208; TSH 2.556 08/07/2016: BUN 22; Creatinine, Ser 0.75; Potassium 4.0; Sodium 135    Lipid Panel Lab Results  Component Value Date   CHOL 215 (H) 01/12/2014   HDL 54.70 01/12/2014   LDLCALC 127 (H) 01/12/2014   TRIG 165.0 (H) 01/12/2014      Wt Readings from Last 3 Encounters:  01/10/17 126 lb 3.2 oz (57.2 kg)  08/04/16 125 lb (56.7 kg)  08/03/16 124 lb (56.2 kg)       ASSESSMENT  AND PLAN:  Chronic atrial fibrillation ([place]) - Plan: EKG 12-Lead Heart rate relatively well controlled, tolerating anticoagulation No changes to her medications  Chronic diastolic CHF (congestive heart failure) ([place]) - Plan: EKG 12-Lead Appears relatively euvolemic, minimal leg edema on today's visit Possibly even exacerbated by felodipine no symptoms mild Currently not on a diuretic  Dizziness She reports having significant dizziness, less likely postural, More often when she looks up Suspect elated to gait instability She has had recent falls Less likely orthostasis as often happen when she is reading and she looks up quickly She is a poor historian  HYPERTENSION, BENIGN Blood pressure is well controlled on today's visit. No changes made to the medications.  Dementia, without  behavioral disturbance Relatively stable, lives alone, no family involved in her care he would seem I do remain concerned about medication compliance, polypharmacy   Total encounter time more than 25 minutes  Greater than 50% was spent in counseling and coordination of care with the patient   Disposition:   F/U  6 months   No orders of the defined types were placed in this encounter.    Signed, Esmond Plants, M.D., Ph.D. 02/03/2017  [place], [place]

## 2017-02-05 ENCOUNTER — Ambulatory Visit: Payer: Medicare Other | Admitting: Cardiovascular Disease

## 2017-02-06 ENCOUNTER — Non-Acute Institutional Stay: Payer: Medicare Other | Admitting: Internal Medicine

## 2017-02-06 ENCOUNTER — Encounter: Payer: Self-pay | Admitting: Internal Medicine

## 2017-02-06 VITALS — BP 116/70 | HR 72 | Resp 16 | Wt 128.0 lb

## 2017-02-06 DIAGNOSIS — R21 Rash and other nonspecific skin eruption: Secondary | ICD-10-CM | POA: Diagnosis not present

## 2017-02-06 DIAGNOSIS — R600 Localized edema: Secondary | ICD-10-CM | POA: Diagnosis not present

## 2017-02-06 DIAGNOSIS — S81801D Unspecified open wound, right lower leg, subsequent encounter: Secondary | ICD-10-CM

## 2017-02-06 NOTE — Progress Notes (Signed)
Subjective:    Patient ID: Donna Rosario, female    DOB: [birth year], 83 y.o.   MRN: [id number]  HPI  Asked to see resident in ALF Resident reports rash on chest, back and upper thighs Resident reports it has been there for months RN putting Hydrocortisone cream on it with some relief.  RN also wants me to assess weeping edema of RLE She has failed Calcium Algonate and Hydrogel.  Review of Systems  Past Medical History:  Diagnosis Date  . Arthritis   . Atrial fibrillation [facility]) Sept. 2011   [facility] Admission for RVR  . Chronic low back pain    due to lumbosacral degenerative joint disease  . Disturbance of skin sensation   . Embolic stroke ([place]) 84/1324  . Hyperlipidemia   . Hypertension   . Mitral valve disorders(424.0)   . Osteoporosis   . Sciatica   . Thyroid disease    Hypothyroidism  . Vascular dementia, uncomplicated     Current Outpatient Prescriptions  Medication Sig Dispense Refill  . alendronate (FOSAMAX) 70 MG tablet Take 1 tablet (70 mg total) by mouth once a week. Take with a full glass of water on an empty stomach. 12 tablet 0  . apixaban (ELIQUIS) 5 MG TABS tablet Take 1 tablet (5 mg total) by mouth 2 (two) times daily. 60 tablet 11  . ARMOUR THYROID 60 MG tablet TAKE 1 TABLET (60 MG TOTAL) BY MOUTH DAILY. 90 tablet 3  . Ascorbic Acid (VITAMIN C) 500 MG tablet Take 500 mg by mouth daily.      Marland Kitchen atorvastatin (LIPITOR) 40 MG tablet Take 1 tablet (40 mg total) by mouth daily at 6 PM. 30 tablet 2  . benzocaine (ANBESOL JR) 10 % mucosal gel Apply to lips as needed for painful blisters 5.3 g 0  . Calcium Carbonate-Vit D-Min (CALCIUM 1200 PO) Take 1 capsule by mouth daily.      . digoxin (LANOXIN) 0.125 MG tablet TAKE 1 TABLET EVERY DAY 90 tablet 0  . ergocalciferol (DRISDOL) 50000 UNITS capsule Take 1 capsule (50,000 Units total) by mouth once a week. 4 capsule 2  . felodipine (PLENDIL) 5 MG 24 hr tablet Take 1 tablet (5 mg total) by mouth daily. 30 tablet 2  .  fish oil-omega-3 fatty acids 1000 MG capsule Take 1 g by mouth daily.      . furosemide (LASIX) 40 MG tablet Take 40 mg by mouth daily.     Marland Kitchen L-Lysine 500 MG TABS Take 1 tablet by mouth daily.      Marland Kitchen lisinopril (PRINIVIL,ZESTRIL) 20 MG tablet Take 1 tablet (20 mg total) by mouth at bedtime. 30 tablet 2  . metoprolol (LOPRESSOR) 50 MG tablet Take 1 tablet (50 mg total) by mouth 2 (two) times daily. 180 tablet 3  . Selenium 200 MCG TABS Take 1 tablet by mouth daily.       No current facility-administered medications for this visit.     Allergies  Allergen Reactions  . Sulfonamide Derivatives     Family History  Problem Relation Age of Onset  . Other Mother        CHF  . Osteoporosis Mother   . Diabetes Mother   . Other Father        unknown  . Osteoporosis Father   . Heart disease Father     Social History   Social History  . Marital status: Widowed    Spouse name: N/A  . Number of children:  2  . Years of education: N/A   Occupational History  . Professional artist--taught art    Social History Main Topics  . Smoking status: Never Smoker  . Smokeless tobacco: Never Used  . Alcohol use 3.0 oz/week    5 Glasses of wine per week     Comment: occasional  . Drug use: No  . Sexual activity: No   Other Topics Concern  . Not on file   Social History Narrative   Has living will   Son or daughter should make health care decisions   Will accept resuscitation     Constitutional: Denies fever, malaise, fatigue, headache or abrupt weight changes.  Skin: Pt reports rash and weeping edema of RLE. Denies lesions.    No other specific complaints in a complete review of systems (except as listed in HPI above).     Objective:   Physical Exam   BP 116/70   Pulse 72   Resp 16   Wt 128 lb (58.1 kg)   BMI 22.67 kg/m  Wt Readings from Last 3 Encounters:  02/06/17 128 lb (58.1 kg)  01/10/17 126 lb 3.2 oz (57.2 kg)  08/04/16 125 lb (56.7 kg)    General: Appears her  stated age, in NAD. Skin: Scattered maculopapular lesions noted on chest, back and upper thighs. Excoriation noted. She has a few 0.75 cm blisters on upper right thigh. RLE swelling with weeping noted from medial aspect of lower leg. Skin has macerated away. Serous drainage noted. No s.s of cellulitis.  BMET    Component Value Date/Time   NA 135 08/07/2016 0452   NA 141 02/24/2015 1041   NA 142 06/08/2014 1940   K 4.0 08/07/2016 0452   K 3.9 06/08/2014 1940   CL 107 08/07/2016 0452   CL 110 (H) 06/08/2014 1940   CO2 23 08/07/2016 0452   CO2 26 06/08/2014 1940   GLUCOSE 105 (H) 08/07/2016 0452   GLUCOSE 105 (H) 06/08/2014 1940   BUN 22 (H) 08/07/2016 0452   BUN 23 02/24/2015 1041   BUN 22 (H) 06/08/2014 1940   CREATININE 0.75 08/07/2016 0452   CREATININE 0.95 06/08/2014 1940   CREATININE 0.95 01/30/2013 1628   CALCIUM 8.5 (L) 08/07/2016 0452   CALCIUM 9.0 06/08/2014 1940   GFRNONAA >60 08/07/2016 0452   GFRNONAA 59 (L) 06/08/2014 1940   GFRAA >60 08/07/2016 0452   GFRAA >60 06/08/2014 1940    Lipid Panel     Component Value Date/Time   CHOL 215 (H) 01/12/2014 1400   TRIG 165.0 (H) 01/12/2014 1400   HDL 54.70 01/12/2014 1400   CHOLHDL 4 01/12/2014 1400   VLDL 33.0 01/12/2014 1400   LDLCALC 127 (H) 01/12/2014 1400    CBC    Component Value Date/Time   WBC 10.9 08/05/2016 0404   RBC 4.81 08/05/2016 0404   HGB 14.2 08/05/2016 0404   HGB 14.0 06/08/2014 1940   HCT 42.9 08/05/2016 0404   HCT 43.1 06/08/2014 1940   PLT 208 08/05/2016 0404   PLT 235 06/08/2014 1940   MCV 89.3 08/05/2016 0404   MCV 90 06/08/2014 1940   MCH 29.5 08/05/2016 0404   MCHC 33.1 08/05/2016 0404   RDW 15.0 (H) 08/05/2016 0404   RDW 14.4 06/08/2014 1940   LYMPHSABS 1.6 08/04/2016 1104   MONOABS 1.0 (H) 08/04/2016 1104   EOSABS 0.0 08/04/2016 1104   BASOSABS 0.1 08/04/2016 1104    Hgb A1C No results found for: HGBA1C  Assessment & Plan:   Rash:  Continue Hydrocortisone  cream Start Claritin 10 mg daily Derm referral if worse  Edema, Open Wound RLE:  Stop Hydrogel and go back to Calcium Algon[facility] Wound clinic referral  Will reassess as needed Webb Silversmith, NP

## 2017-02-06 NOTE — Patient Instructions (Signed)
Edema Edema is when you have too much fluid in your body or under your skin. Edema may make your legs, feet, and ankles swell up. Swelling is also common in looser tissues, like around your eyes. This is a common condition. It gets more common as you get older. There are many possible causes of edema. Eating too much salt (sodium) and being on your feet or sitting for a long time can cause edema in your legs, feet, and ankles. Hot weather may make edema worse. Edema is usually painless. Your skin may look swollen or shiny. Follow these instructions at home:  Keep the swollen body part raised (elevated) above the level of your heart when you are sitting or lying down.  Do not sit still or stand for a long time.  Do not wear tight clothes. Do not wear garters on your upper legs.  Exercise your legs. This can help the swelling go down.  Wear elastic bandages or support stockings as told by your doctor.  Eat a low-salt (low-sodium) diet to reduce fluid as told by your doctor.  Depending on the cause of your swelling, you may need to limit how much fluid you drink (fluid restriction).  Take over-the-counter and prescription medicines only as told by your doctor. Contact a doctor if:  Treatment is not working.  You have heart, liver, or kidney disease and have symptoms of edema.  You have sudden and unexplained weight gain. Get help right away if:  You have shortness of breath or chest pain.  You cannot breathe when you lie down.  You have pain, redness, or warmth in the swollen areas.  You have heart, liver, or kidney disease and get edema all of a sudden.  You have a fever and your symptoms get worse all of a sudden. Summary  Edema is when you have too much fluid in your body or under your skin.  Edema may make your legs, feet, and ankles swell up. Swelling is also common in looser tissues, like around your eyes.  Raise (elevate) the swollen body part above the level of your  heart when you are sitting or lying down.  Follow your doctor's instructions about diet and how much fluid you can drink (fluid restriction). This information is not intended to replace advice given to you by your health care provider. Make sure you discuss any questions you have with your health care provider. Document Released: 11/28/2007 Document Revised: 06/29/2016 Document Reviewed: 06/29/2016 Elsevier Interactive Patient Education  2017 [employer]  

## 2017-02-10 NOTE — Progress Notes (Deleted)
Cardiology Office Note  Date:  02/10/2017   ID:  Donna Rosario, DOB 13-Jul-1922, MRN [id number]  PCP:  Donna Mc, MD   No chief complaint on file.   HPI:  Donna Rosario is a 83 year old woman with history of   sciatic and back pain, "crushed right hip",  s/p right hip replacement,   admitted to the hospital in early September 2011 with atrial fibrillation, started on warfarin and rate control, underlying mild dementia and confusion with polypharmacy,  taken off warfarin secondary to inability to manage this herself,  previously started on Pradaxa b.i.d.  We did have 2  nursing agencies try to establish a relationship with her for polypharmacy. She  discharged them.  Mild chronic lower extremity edema. Significant confusion in the past  with her medications. She presents today for follow-up of her atrial fibrillation  In follow-up today, she reports that she is doing relatively well She does have some Leg weakness She has had falls, details unclear Gets dizzy at times, when she does too much reading, has to look up Also has dizziness when she is standing and she looks up for too long Considers herself a Swimmer , goes swimming 1 x per week Reports doing exercise 5 days per week, details unclear Currently lives at twin [place]  requesting pradaxa samples, as on her last clinic visit. Reports it is expensive Reports she has no family in town to help take care of her  EKG on today's visit shows atrial fibrillation with rate 76 bpm no significant ST or T-wave changes  Other past medical history In the past, she drank a significant amount of soda/Pepsi.  Previous echocardiogram shows normal systolic function, ejection fraction 55%, mild LVH with diastolic dysfunction, mildly dilated left atrium, mild to moderate MR, moderate TR with elevated right ventricular systolic pressures consistent with mild pulmonary hypertension  PMH:   has a past medical history of Arthritis; Atrial  fibrillation Donna Rosario) (Sept. 2011); Chronic low back pain; Disturbance of skin sensation; Embolic stroke ([place]) (67/8938); Hyperlipidemia; Hypertension; Mitral valve disorders(424.0); Osteoporosis; Sciatica; Thyroid disease; and Vascular dementia, uncomplicated.  PSH:    Past Surgical History:  Procedure Laterality Date  . JOINT REPLACEMENT     Right Hip Oct 2011,  Left Knee 2008 ([facility])  . KNEE SURGERY     Left knee  . THYROIDECTOMY    . TOTAL HIP ARTHROPLASTY  2011    Current Outpatient Prescriptions  Medication Sig Dispense Refill  . alendronate (FOSAMAX) 70 MG tablet Take 1 tablet (70 mg total) by mouth once a week. Take with a full glass of water on an empty stomach. 12 tablet 0  . apixaban (ELIQUIS) 5 MG TABS tablet Take 1 tablet (5 mg total) by mouth 2 (two) times daily. 60 tablet 11  . ARMOUR THYROID 60 MG tablet TAKE 1 TABLET (60 MG TOTAL) BY MOUTH DAILY. 90 tablet 3  . Ascorbic Acid (VITAMIN C) 500 MG tablet Take 500 mg by mouth daily.      Marland Kitchen atorvastatin (LIPITOR) 40 MG tablet Take 1 tablet (40 mg total) by mouth daily at 6 PM. 30 tablet 2  . benzocaine (ANBESOL JR) 10 % mucosal gel Apply to lips as needed for painful blisters 5.3 g 0  . Calcium Carbonate-Vit D-Min (CALCIUM 1200 PO) Take 1 capsule by mouth daily.      . digoxin (LANOXIN) 0.125 MG tablet TAKE 1 TABLET EVERY DAY 90 tablet 0  . ergocalciferol (DRISDOL) 50000 UNITS capsule Take 1  capsule (50,000 Units total) by mouth once a week. 4 capsule 2  . felodipine (PLENDIL) 5 MG 24 hr tablet Take 1 tablet (5 mg total) by mouth daily. 30 tablet 2  . fish oil-omega-3 fatty acids 1000 MG capsule Take 1 g by mouth daily.      . furosemide (LASIX) 40 MG tablet Take 40 mg by mouth daily.     Marland Kitchen L-Lysine 500 MG TABS Take 1 tablet by mouth daily.      Marland Kitchen lisinopril (PRINIVIL,ZESTRIL) 20 MG tablet Take 1 tablet (20 mg total) by mouth at bedtime. 30 tablet 2  . metoprolol (LOPRESSOR) 50 MG tablet Take 1 tablet (50 mg total) by mouth 2  (two) times daily. 180 tablet 3  . Selenium 200 MCG TABS Take 1 tablet by mouth daily.       No current facility-administered medications for this visit.      Allergies:   Sulfonamide derivatives   Social History:  The patient  reports that she has never smoked. She has never used smokeless tobacco. She reports that she drinks about 3.0 oz of alcohol per week . She reports that she does not use drugs.   Family History:   family history includes Diabetes in her mother; Heart disease in her father; Osteoporosis in her father and mother; Other in her father and mother.    Review of Systems: Review of Systems  Constitutional: Negative.   Respiratory: Negative.   Cardiovascular: Positive for leg swelling.  Gastrointestinal: Negative.   Musculoskeletal: Negative.   Neurological: Positive for dizziness.  Psychiatric/Behavioral: Negative.   All other systems reviewed and are negative.    PHYSICAL EXAM: VS:  There were no vitals taken for this visit. , BMI There is no height or weight on file to calculate BMI. GEN: Well nourished, well developed, in no acute distress  HEENT: normal  Neck: no JVD, carotid bruits, or masses Cardiac: irreg irreg,  2+ RSB SEM, no rubs, or gallops, minimal B/L pitting  edema  Respiratory:  clear to auscultation bilaterally, normal work of breathing GI: soft, nontender, nondistended, + BS MS: no deformity or atrophy  Skin: warm and dry, no rash Neuro:  Strength and sensation are intact Psych: euthymic mood, full affect    Recent Labs: 08/05/2016: ALT 15; Hemoglobin 14.2; Platelets 208; TSH 2.556 08/07/2016: BUN 22; Creatinine, Ser 0.75; Potassium 4.0; Sodium 135    Lipid Panel Lab Results  Component Value Date   CHOL 215 (H) 01/12/2014   HDL 54.70 01/12/2014   LDLCALC 127 (H) 01/12/2014   TRIG 165.0 (H) 01/12/2014      Wt Readings from Last 3 Encounters:  02/06/17 128 lb (58.1 kg)  01/10/17 126 lb 3.2 oz (57.2 kg)  08/04/16 125 lb (56.7 kg)        ASSESSMENT AND PLAN:  Chronic atrial fibrillation ([place]) - Plan: EKG 12-Lead Heart rate relatively well controlled, tolerating anticoagulation No changes to her medications  Chronic diastolic CHF (congestive heart failure) ([place]) - Plan: EKG 12-Lead Appears relatively euvolemic, minimal leg edema on today's visit Possibly even exacerbated by felodipine no symptoms mild Currently not on a diuretic  Dizziness She reports having significant dizziness, less likely postural, More often when she looks up Suspect elated to gait instability She has had recent falls Less likely orthostasis as often happen when she is reading and she looks up quickly She is a poor historian  HYPERTENSION, BENIGN Blood pressure is well controlled on today's visit. No changes made  to the medications.  Dementia, without behavioral disturbance Relatively stable, lives alone, no family involved in her care he would seem I do remain concerned about medication compliance, polypharmacy   Total encounter time more than 25 minutes  Greater than 50% was spent in counseling and coordination of care with the patient   Disposition:   F/U  6 months   No orders of the defined types were placed in this encounter.    Signed, Esmond Plants, M.D., Ph.D. 02/10/2017  [place], [place]

## 2017-02-11 ENCOUNTER — Ambulatory Visit: Payer: Medicare Other | Admitting: Cardiovascular Disease

## 2017-02-14 ENCOUNTER — Encounter: Payer: Medicare Other | Attending: Surgery | Admitting: Surgery

## 2017-02-14 DIAGNOSIS — M1990 Unspecified osteoarthritis, unspecified site: Secondary | ICD-10-CM | POA: Insufficient documentation

## 2017-02-14 DIAGNOSIS — Z7901 Long term (current) use of anticoagulants: Secondary | ICD-10-CM | POA: Diagnosis not present

## 2017-02-14 DIAGNOSIS — L97212 Non-pressure chronic ulcer of right calf with fat layer exposed: Secondary | ICD-10-CM | POA: Insufficient documentation

## 2017-02-14 DIAGNOSIS — Z882 Allergy status to sulfonamides status: Secondary | ICD-10-CM | POA: Insufficient documentation

## 2017-02-14 DIAGNOSIS — I110 Hypertensive heart disease with heart failure: Secondary | ICD-10-CM | POA: Insufficient documentation

## 2017-02-14 DIAGNOSIS — I4891 Unspecified atrial fibrillation: Secondary | ICD-10-CM | POA: Diagnosis not present

## 2017-02-14 DIAGNOSIS — I5032 Chronic diastolic (congestive) heart failure: Secondary | ICD-10-CM | POA: Diagnosis not present

## 2017-02-14 DIAGNOSIS — Z8673 Personal history of transient ischemic attack (TIA), and cerebral infarction without residual deficits: Secondary | ICD-10-CM | POA: Diagnosis not present

## 2017-02-14 DIAGNOSIS — E039 Hypothyroidism, unspecified: Secondary | ICD-10-CM | POA: Diagnosis not present

## 2017-02-14 DIAGNOSIS — I890 Lymphedema, not elsewhere classified: Secondary | ICD-10-CM | POA: Insufficient documentation

## 2017-02-14 DIAGNOSIS — Z8249 Family history of ischemic heart disease and other diseases of the circulatory system: Secondary | ICD-10-CM | POA: Diagnosis not present

## 2017-02-14 DIAGNOSIS — Z79899 Other long term (current) drug therapy: Secondary | ICD-10-CM | POA: Diagnosis not present

## 2017-02-14 DIAGNOSIS — M545 Low back pain: Secondary | ICD-10-CM | POA: Diagnosis not present

## 2017-02-14 DIAGNOSIS — M810 Age-related osteoporosis without current pathological fracture: Secondary | ICD-10-CM | POA: Diagnosis not present

## 2017-02-14 DIAGNOSIS — F0150 Vascular dementia without behavioral disturbance: Secondary | ICD-10-CM | POA: Diagnosis not present

## 2017-02-14 DIAGNOSIS — Z96652 Presence of left artificial knee joint: Secondary | ICD-10-CM | POA: Insufficient documentation

## 2017-02-14 NOTE — Progress Notes (Addendum)
LORIS, SEELYE ([id number]) Visit Report for 02/14/2017 Allergy List Details Patient Name: Donna Rosario, Donna Rosario Date of Service: 02/14/2017 12:45 PM Medical Record Number: [id number] Patient Account Number: [id number] Date of Birth/Sex: [birth year] (83 y.o. Female) Treating RN: Cornell Barman Primary Care Analeya Luallen: Viviana Simpler Other Clinician: Referring Alan Drummer: Viviana Simpler Treating Elea Holtzclaw/Extender: Frann Rider in Treatment: 0 Allergies Active Allergies Sulfa (Sulfonamide Antibiotics) Allergy Notes Electronic Signature(s) Signed: 02/14/2017 7:53:58 PM By: Gretta Cool, BSN, RN, CWS, Kim RN, BSN Previous Signature: 02/14/2017 9:18:28 AM Version By: Gretta Cool, BSN, RN, CWS, Kim RN, BSN Entered By: Gretta Cool, BSN, RN, CWS, Kim on 02/14/2017 13:17:04 Donna Rosario ([id number]) -------------------------------------------------------------------------------- Arrival Information Details Patient Name: Donna Rosario Date of Service: 02/14/2017 12:45 PM Medical Record Number: [id number] Patient Account Number: [id number] Date of Birth/Sex: [birth year] (83 y.o. Female) Treating RN: Cornell Barman Primary Care Kalisa Girtman: Viviana Simpler Other Clinician: Referring Chihiro Frey: Viviana Simpler Treating Ceaira Ernster/Extender: Frann Rider in Treatment: 0 Visit Information Patient Arrived: Wheel Chair Arrival Time: 12:57 Accompanied By: caregiver Transfer Assistance: None Patient Identification Verified: Yes Secondary Verification Process Yes Completed: Patient Requires Transmission- No Based Precautions: Patient Has Alerts: Yes Patient Alerts: Patient on Blood Thinner Eliquis Not Diabetic Electronic Signature(s) Signed: 02/14/2017 7:53:58 PM By: Gretta Cool, BSN, RN, CWS, Kim RN, BSN Entered By: Gretta Cool, BSN, RN, CWS, Kim on 02/14/2017 12:58:38 Donna Rosario ([id number]) -------------------------------------------------------------------------------- Clinic Level of Care Assessment Details Patient Name: Donna Rosario Date of Service: 02/14/2017 12:45 PM Medical Record Number: [id number] Patient Account Number: [id number] Date of Birth/Sex: 12-18-1922 (83 y.o. Female) Treating RN: Cornell Barman Primary Care Gurbani Figge: Viviana Simpler Other Clinician: Referring Rocklyn Mayberry: Viviana Simpler Treating Hazel Wrinkle/Extender: Frann Rider in Treatment: 0 Clinic Level of Care Assessment Items TOOL 1 Quantity Score []  - Use when EandM and Procedure is performed on INITIAL visit 0 ASSESSMENTS - Nursing Assessment / Reassessment X - General Physical Exam (combine w/ comprehensive assessment (listed just 1 20 below) when performed on new pt. evals) X - Comprehensive Assessment (HX, ROS, Risk Assessments, Wounds Hx, etc.) 1 25 ASSESSMENTS - Wound and Skin Assessment / Reassessment []  - Dermatologic / Skin Assessment (not related to wound area) 0 ASSESSMENTS - Ostomy and/or Continence Assessment and Care []  - Incontinence Assessment and Management 0 []  - Ostomy Care Assessment and Management (repouching, etc.) 0 PROCESS - Coordination of Care X - Simple Patient / Family Education for ongoing care 1 15 []  - Complex (extensive) Patient / Family Education for ongoing care 0 X - Staff obtains Consents, Records, Test Results / Process Orders 1 10 []  - Staff telephones HHA, Nursing Homes / Clarify orders / etc 0 []  - Routine Transfer to another Facility (non-emergent condition) 0 []  - Routine Hospital Admission (non-emergent condition) 0 []  - New Admissions / Biomedical engineer / Ordering NPWT, Apligraf, etc. 0 []  - Emergency Hospital Admission (emergent condition) 0 PROCESS - Special Needs []  - Pediatric / Minor Patient Management 0 []  - Isolation Patient Management 0 [name], [name] ([id number]) []  - Hearing / Language / Visual special needs 0 []  - Assessment of Community assistance (transportation, D/C planning, etc.) 0 []  - Additional assistance / Altered mentation 0 []  - Support Surface(s) Assessment  (bed, cushion, seat, etc.) 0 INTERVENTIONS - Miscellaneous []  - External ear exam 0 []  - Patient Transfer (multiple staff / Civil Service fast streamer / Similar devices) 0 []  - Simple Staple / Suture removal (25 or less) 0 []  - Complex Staple / Suture removal (26 or more) 0 []  - Hypo/Hyperglycemic Management (do not check if billed  separately) 0 X - Ankle / Brachial Index (ABI) - do not check if billed separately 1 15 Has the patient been seen at the hospital within the last three years: Yes Total Score: 85 Level Of Care: New/Established - Level 3 Electronic Signature(s) Signed: 02/14/2017 7:53:58 PM By: Gretta Cool, BSN, RN, CWS, Kim RN, BSN Entered By: Gretta Cool, BSN, RN, CWS, Kim on 02/14/2017 13:33:27 Donna Rosario ([id number]) -------------------------------------------------------------------------------- Encounter Discharge Information Details Patient Name: Donna Rosario Date of Service: 02/14/2017 12:45 PM Medical Record Number: [id number] Patient Account Number: [id number] Date of Birth/Sex: 1922/06/26 (83 y.o. Female) Treating RN: Cornell Barman Primary Care Tarrance Januszewski: Viviana Simpler Other Clinician: Referring Ritika Hellickson: Viviana Simpler Treating Bryanda Mikel/Extender: Frann Rider in Treatment: 0 Encounter Discharge Information Items Discharge Pain Level: 0 Discharge Condition: Stable Ambulatory Status: Walker Discharge Destination: Home Transportation: Private Auto Accompanied By: caregiver Schedule Follow-up Appointment: Yes Medication Reconciliation completed Yes and provided to Patient/Care Shyla Gayheart: Provided on Clinical Summary of Care: 02/14/2017 Form Type Recipient Paper Patient DD Electronic Signature(s) Signed: 02/14/2017 7:53:58 PM By: Gretta Cool, BSN, RN, CWS, Kim RN, BSN Entered By: Gretta Cool, BSN, RN, CWS, Kim on 02/14/2017 13:45:33 Donna Rosario ([id number]) -------------------------------------------------------------------------------- Lower Extremity Assessment Details Patient Name:  Donna Rosario Date of Service: 02/14/2017 12:45 PM Medical Record Number: [id number] Patient Account Number: [id number] Date of Birth/Sex: 1922-10-17 (83 y.o. Female) Treating RN: Cornell Barman Primary Care Shadman Tozzi: Viviana Simpler Other Clinician: Referring Nhat Hearne: Viviana Simpler Treating Garielle Mroz/Extender: Frann Rider in Treatment: 0 Vascular Assessment Pulses: Dorsalis Pedis Palpable: [Left:Yes] Doppler Audible: [Left:Yes] Posterior Tibial Palpable: [Left:Yes] Doppler Audible: [Left:Yes] Extremity colors, hair growth, and conditions: Extremity Color: [Left:Red] Hair Growth on Extremity: [Left:No] Temperature of Extremity: [Left:Warm] Capillary Refill: [Left:< 3 seconds] Dependent Rubor: [Left:No] Blanched when Elevated: [Left:No] Lipodermatosclerosis: [Left:No] Blood Pressure: Brachial: [Left:140] Dorsalis Pedis: 160 [Left:Dorsalis Pedis:] Ankle: Posterior Tibial: [Left:Posterior Tibial: 1.14] Toe Nail Assessment Left: Right: Thick: Yes Discolored: Yes Deformed: Yes Improper Length and Hygiene: No Electronic Signature(s) Signed: 02/14/2017 7:53:58 PM By: Gretta Cool, BSN, RN, CWS, Kim RN, BSN Entered By: Gretta Cool, BSN, RN, CWS, Kim on 02/14/2017 13:16:59 Donna Rosario ([id number]) -------------------------------------------------------------------------------- Multi Wound Chart Details Patient Name: Donna Rosario Date of Service: 02/14/2017 12:45 PM Medical Record Number: [id number] Patient Account Number: [id number] Date of Birth/Sex: [birth year] (83 y.o. Female) Treating RN: Cornell Barman Primary Care Kimbley Sprague: Viviana Simpler Other Clinician: Referring Jonaven Hilgers: Viviana Simpler Treating Bettie Swavely/Extender: Frann Rider in Treatment: 0 Vital Signs Height(in): 63 Pulse(bpm): 55 Weight(lbs): Blood Pressure 135/60 (mmHg): Body Mass Index(BMI): Temperature(F): 97.8 Respiratory Rate 16 (breaths/min): Photos: [N/A:N/A] Wound Location: Left Lower Leg - N/A  N/A Circumfernential Wounding Event: Gradually Appeared N/A N/A Primary Etiology: Lymphedema N/A N/A Comorbid History: Arrhythmia, Hypertension, N/A N/A Osteoarthritis, Dementia Date Acquired: 01/14/2017 N/A N/A Weeks of Treatment: 0 N/A N/A Wound Status: Open N/A N/A Clustered Wound: Yes N/A N/A Measurements L x W x D 17x28x0.1 N/A N/A (cm) Area (cm) : 373.85 N/A N/A Volume (cm) : 37.385 N/A N/A Classification: Full Thickness Without N/A N/A Exposed Support Structures Exudate Amount: Large N/A N/A Exudate Type: Serous N/A N/A Exudate Color: amber N/A N/A Wound Margin: Indistinct, nonvisible N/A N/A Granulation Amount: Large (67-100%) N/A N/A Necrotic Amount: None Present (0%) N/A N/A Exposed Structures: N/A N/A Donna Rosario ([id number]) Fascia: No Fat Layer (Subcutaneous Tissue) Exposed: No Tendon: No Muscle: No Joint: No Bone: No Epithelialization: Large (67-100%) N/A N/A Periwound Skin Texture: Excoriation: Yes N/A N/A Induration: No Callus: No Crepitus: No Rash: No Scarring: No Periwound Skin Maceration: No N/A N/A  Moisture: Dry/Scaly: No Periwound Skin Color: Atrophie Blanche: No N/A N/A Cyanosis: No Ecchymosis: No Erythema: No Hemosiderin Staining: No Mottled: No Pallor: No Rubor: No Tenderness on No N/A N/A Palpation: Wound Preparation: Ulcer Cleansing: N/A N/A Rinsed/Irrigated with Saline Topical Anesthetic Applied: None Treatment Notes Wound #1 (Left, Circumferential Lower Leg) 1. Cleansed with: Clean wound with Normal Saline 3. Peri-wound Care: Barrier cream 4. Dressing Applied: Aquacel Ag 5. Secondary Dressing Applied ABD Pad 7. Secured with 3 Layer Compression System - Right Lower Extremity Electronic Signature(s) CHRISTNE, PLATTS ([id number]) Signed: 02/14/2017 3:24:30 PM By: Christin Fudge MD, FACS Entered By: Christin Fudge on 02/14/2017 13:57:23 Donna Rosario  ([id number]) -------------------------------------------------------------------------------- Multi-Disciplinary Care Plan Details Patient Name: Donna Rosario Date of Service: 02/14/2017 12:45 PM Medical Record Number: [id number] Patient Account Number: [id number] Date of Birth/Sex: May 09, 1923 (83 y.o. Female) Treating RN: Cornell Barman Primary Care Kymberli Wiegand: Viviana Simpler Other Clinician: Referring Wilson Dusenbery: Viviana Simpler Treating Mikias Lanz/Extender: Frann Rider in Treatment: 0 Active Inactive ` Orientation to the Wound Care Program Nursing Diagnoses: Knowledge deficit related to the wound healing center program Goals: Patient/caregiver will verbalize understanding of the [place] Program Date Initiated: 02/14/2017 Target Resolution Date: 02/25/2017 Goal Status: Active Interventions: Provide education on orientation to the wound center Notes: ` Wound/Skin Impairment Nursing Diagnoses: Knowledge deficit related to ulceration/compromised skin integrity Goals: Ulcer/skin breakdown will heal within 14 weeks Date Initiated: 02/14/2017 Target Resolution Date: 02/25/2017 Goal Status: Active Interventions: Assess ulceration(s) every visit Treatment Activities: Skin care regimen initiated : 02/14/2017 Topical wound management initiated : 02/14/2017 Notes: Electronic Signature(s) ILINE, BUCHINGER ([id number]) Signed: 02/14/2017 7:53:58 PM By: Gretta Cool, BSN, RN, CWS, Kim RN, BSN Entered By: Gretta Cool, BSN, RN, CWS, Kim on 02/14/2017 13:26:39 Donna Rosario ([id number]) -------------------------------------------------------------------------------- Pain Assessment Details Patient Name: Donna Rosario Date of Service: 02/14/2017 12:45 PM Medical Record Number: [id number] Patient Account Number: [id number] Date of Birth/Sex: [birth year] (83 y.o. Female) Treating RN: Cornell Barman Primary Care Taejah Ohalloran: Viviana Simpler Other Clinician: Referring Vassie Kugel: Viviana Simpler Treating  Isaura Schiller/Extender: Frann Rider in Treatment: 0 Active Problems Location of Pain Severity and Description of Pain Patient Has Paino No Site Locations With Dressing Change: No Pain Management and Medication Current Pain Management: Goals for Pain Management Topical or injectable lidocaine is offered to patient for acute pain when surgical debridement is performed. If needed, Patient is instructed to use over the counter pain medication for the following 24-48 hours after debridement. Wound care MDs do not prescribed pain medications. Patient has chronic pain or uncontrolled pain. Patient has been instructed to make an appointment with their Primary Care Physician for pain management. Electronic Signature(s) Signed: 02/14/2017 7:53:58 PM By: Gretta Cool, BSN, RN, CWS, Kim RN, BSN Entered By: Gretta Cool, BSN, RN, CWS, Kim on 02/14/2017 12:59:11 Donna Rosario ([id number]) -------------------------------------------------------------------------------- Patient/Caregiver Education Details Patient Name: Donna Rosario Date of Service: 02/14/2017 12:45 PM Medical Record Number: [id number] Patient Account Number: [id number] Date of Birth/Gender: [birth year] (83 y.o. Female) Treating RN: Cornell Barman Primary Care Physician: Viviana Simpler Other Clinician: Referring Physician: Viviana Simpler Treating Physician/Extender: Frann Rider in Treatment: 0 Education Assessment Education Provided To: Patient Education Topics Provided Wound/Skin Impairment: Handouts: Caring for Your Ulcer, Other: notes sent with patient Methods: Demonstration Responses: State content correctly Electronic Signature(s) Signed: 02/14/2017 7:53:58 PM By: Gretta Cool, BSN, RN, CWS, Kim RN, BSN Entered By: Gretta Cool, BSN, RN, CWS, Kim on 02/14/2017 13:45:51 Donna Rosario ([id number]) -------------------------------------------------------------------------------- Wound Assessment Details Patient Name: Donna Rosario Date of  Service: 02/14/2017 12:45 PM Medical Record Number: [id number] Patient  Account Number: [id number] Date of Birth/Sex: 1923/04/29 (83 y.o. Female) Treating RN: Cornell Barman Primary Care Debbra Digiulio: Viviana Simpler Other Clinician: Referring Zebedee Segundo: Viviana Simpler Treating Tytiana Coles/Extender: Frann Rider in Treatment: 0 Wound Status Wound Number: 1 Primary Lymphedema Etiology: Wound Location: Left Lower Leg - Circumfernential Wound Status: Open Wounding Event: Gradually Appeared Comorbid Arrhythmia, Hypertension, History: Osteoarthritis, Dementia Date Acquired: 01/14/2017 Weeks Of Treatment: 0 Clustered Wound: Yes Photos Wound Measurements Length: (cm) 17 Width: (cm) 28 Depth: (cm) 0.1 Area: (cm) 373.85 Volume: (cm) 37.385 % Reduction in Area: % Reduction in Volume: Epithelialization: Large (67-100%) Tunneling: No Undermining: No Wound Description Full Thickness Without Exposed Classification: Support Structures Wound Margin: Indistinct, nonvisible Exudate Large Amount: Exudate Type: Serous Exudate Color: amber Foul Odor After Cleansing: No Slough/Fibrino No Wound Bed Granulation Amount: Large (67-100%) Exposed Structure Necrotic Amount: None Present (0%) Fascia Exposed: No Fat Layer (Subcutaneous Tissue) Exposed: No Tendon Exposed: No Muscle Exposed: No Dase, Meghan ([id number]) Joint Exposed: No Bone Exposed: No Periwound Skin Texture Texture Color No Abnormalities Noted: No No Abnormalities Noted: No Callus: No Atrophie Blanche: No Crepitus: No Cyanosis: No Excoriation: Yes Ecchymosis: No Induration: No Erythema: No Rash: No Hemosiderin Staining: No Scarring: No Mottled: No Pallor: No Moisture Rubor: No No Abnormalities Noted: No Dry / Scaly: No Maceration: No Wound Preparation Ulcer Cleansing: Rinsed/Irrigated with Saline Topical Anesthetic Applied: None Treatment Notes Wound #1 (Left, Circumferential Lower Leg) 1. Cleansed  with: Clean wound with Normal Saline 3. Peri-wound Care: Barrier cream 4. Dressing Applied: Aquacel Ag 5. Secondary Dressing Applied ABD Pad 7. Secured with 3 Layer Compression System - Right Lower Extremity Electronic Signature(s) Signed: 02/14/2017 7:53:58 PM By: Gretta Cool, BSN, RN, CWS, Kim RN, BSN Entered By: Gretta Cool, BSN, RN, CWS, Kim on 02/14/2017 13:09:02 Donna Rosario ([id number]) -------------------------------------------------------------------------------- Vitals Details Patient Name: Donna Rosario Date of Service: 02/14/2017 12:45 PM Medical Record Number: [id number] Patient Account Number: [id number] Date of Birth/Sex: 02-02-1923 (83 y.o. Female) Treating RN: Cornell Barman Primary Care Chandi Nicklin: Viviana Simpler Other Clinician: Referring Cassandra Mcmanaman: Viviana Simpler Treating Keymiah Lyles/Extender: Frann Rider in Treatment: 0 Vital Signs Time Taken: 13:04 Temperature (F): 97.8 Height (in): 63 Pulse (bpm): 55 Respiratory Rate (breaths/min): 16 Blood Pressure (mmHg): 135/60 Reference Range: 80 - 120 mg / dl Electronic Signature(s) Signed: 02/14/2017 7:53:58 PM By: Gretta Cool, BSN, RN, CWS, Kim RN, BSN Entered By: Gretta Cool, BSN, RN, CWS, Kim on 02/14/2017 13:04:56

## 2017-02-16 NOTE — Progress Notes (Signed)
Donna, Rosario ([id number]) Visit Report for 02/14/2017 Chief Complaint Document Details Patient Name: Donna, Rosario Date of Service: 02/14/2017 12:45 PM Medical Record Number: [id number] Patient Account Number: [id number] Date of Birth/Sex: [birth year] (83 y.o. Female) Treating RN: Cornell Barman Primary Care Provider: Viviana Simpler Other Clinician: Referring Provider: Viviana Simpler Treating Provider/Extender: Frann Rider in Treatment: 0 Information Obtained from: Patient Chief Complaint Patient presents to the wound care center for a consult due non healing wound to her right lower extremity which she's had for about 6 weeks Electronic Signature(s) Signed: 02/14/2017 3:24:30 PM By: Christin Fudge MD, FACS Entered By: Christin Fudge on 02/14/2017 13:58:13 Donna Rosario ([id number]) -------------------------------------------------------------------------------- HPI Details Patient Name: Donna Rosario Date of Service: 02/14/2017 12:45 PM Medical Record Number: [id number] Patient Account Number: [id number] Date of Birth/Sex: 22-Jan-1923 (83 y.o. Female) Treating RN: Cornell Barman Primary Care Provider: Viviana Simpler Other Clinician: Referring Provider: Viviana Simpler Treating Provider/Extender: Frann Rider in Treatment: 0 History of Present Illness Location: swelling and ulceration on the right lower extremity Quality: Patient reports experiencing a dull pain to affected area(s). Severity: Patient states wound are getting worse. Duration: Patient has had the wound for < 8 weeks prior to presenting for treatment Timing: Pain in wound is Intermittent (comes and goes Context: The wound would happen gradually Modifying Factors: Other treatment(s) tried include:various types of wraps have been applied Associated Signs and Symptoms: Patient reports having increase swelling. HPI Description: 83 year old patient with a personal history of TIA, vascular dementia, delirium,  atrial fibrillation, essential hypertension, hypothyroidism, nonrheumatic mitral valve disorder and osteoporosis was sent to see [place] with weeping edema of the right lower extremity which has failed local care. She also has a past surgical history of right hip joint replacement and a left knee replacement in 2008. She is also had a thyroidectomy and a total hip arthroplasty. she has never been a smoker. Her PCP treated her with furosemide for her chronic diastolic CHF, Eliquis for her atrial fibrillation,and though the diagnosis of venous insufficiency was made I believe there are no pertinent workup available. Her power of attorney of family members not here at the bedside so we do not know details of how aggressive they want to be with therapy Electronic Signature(s) Signed: 02/14/2017 3:24:30 PM By: Christin Fudge MD, FACS Entered By: Christin Fudge on 02/14/2017 14:03:57 Donna Rosario ([id number]) -------------------------------------------------------------------------------- Physical Exam Details Patient Name: Donna Rosario Date of Service: 02/14/2017 12:45 PM Medical Record Number: [id number] Patient Account Number: [id number] Date of Birth/Sex: Dec 18, 1922 (83 y.o. Female) Treating RN: Cornell Barman Primary Care Provider: Viviana Simpler Other Clinician: Referring Provider: Viviana Simpler Treating Provider/Extender: Frann Rider in Treatment: 0 Constitutional . Pulse regular. Respirations normal and unlabored. Afebrile. . Eyes Nonicteric. Reactive to light. Ears, Nose, Mouth, and Throat Lips, teeth, and gums WNL.Marland Kitchen Moist mucosa without lesions. Neck supple and nontender. No palpable supraclavicular or cervical adenopathy. Normal sized without goiter. Respiratory WNL. No retractions.. Cardiovascular Pedal Pulses WNL. ABI on the right was 1.14. No clubbing, cyanosis there is evidence of bilateral stage II lymphedema right worse than the left. Gastrointestinal (GI) Abdomen  without masses or tenderness.. No liver or spleen enlargement or tenderness.. Lymphatic No adneopathy. No adenopathy. No adenopathy. Musculoskeletal Adexa without tenderness or enlargement.. Digits and nails w/o clubbing, cyanosis, infection, petechiae, ischemia, or inflammatory conditions.. Integumentary (Hair, Skin) No suspicious lesions. No crepitus or fluctuance. No peri-wound warmth or erythema. No masses.Marland Kitchen Psychiatric Judgement and insight Intact.. No evidence of depression, anxiety, or agitation.. Notes The lymphedema  is more on the right as compared to the left. she has a lot of excoriation of the right lower extremity around the calf and shin but there is no evidence of cellulitis. No sharp debridement was required today. Electronic Signature(s) Signed: 02/14/2017 3:24:30 PM By: Christin Fudge MD, FACS Entered By: Christin Fudge on 02/14/2017 14:05:01 Donna Rosario ([id number]) -------------------------------------------------------------------------------- Physician Orders Details Patient Name: Donna Rosario Date of Service: 02/14/2017 12:45 PM Medical Record Number: [id number] Patient Account Number: [id number] Date of Birth/Sex: 1923-04-19 (83 y.o. Female) Treating RN: Cornell Barman Primary Care Provider: Viviana Simpler Other Clinician: Referring Provider: Viviana Simpler Treating Provider/Extender: Frann Rider in Treatment: 0 Verbal / Phone Orders: No Diagnosis Coding Wound Cleansing o May Shower, gently pat wound dry prior to applying new dressing. o May shower with protection. - can be removed before nurse is scheduled to change dressing. Skin Barriers/Peri-Wound Care Wound #1 Left,Circumferential Lower Leg o Barrier cream Primary Wound Dressing Wound #1 Left,Circumferential Lower Leg o Aquacel Ag Secondary Dressing Wound #1 Left,Circumferential Lower Leg o ABD pad Dressing Change Frequency o Change Dressing Monday, Wednesday, Friday Follow-up  Appointments Wound #1 Left,Circumferential Lower Leg o Return Appointment in 1 week. Edema Control Wound #1 Left,Circumferential Lower Leg o 3 Layer Compression System - Right Lower Extremity o Elevate legs to the level of the heart and pump ankles as often as possible - Do not sit with legs dangling down, please elevate. Electronic Signature(s) Signed: 02/14/2017 3:24:30 PM By: Christin Fudge MD, FACS Signed: 02/14/2017 7:53:58 PM By: Gretta Cool BSN, RN, CWS, Kim RN, BSN Entered By: Gretta Cool, BSN, RN, CWS, Kim on 02/14/2017 13:32:37 ELESHA, THEDFORD ([id number]) -------------------------------------------------------------------------------- Problem List Details Patient Name: Donna Rosario Date of Service: 02/14/2017 12:45 PM Medical Record Number: [id number] Patient Account Number: [id number] Date of Birth/Sex: Jul 10, 1922 (83 y.o. Female) Treating RN: Cornell Barman Primary Care Provider: Viviana Simpler Other Clinician: Referring Provider: Viviana Simpler Treating Provider/Extender: Frann Rider in Treatment: 0 Active Problems ICD-10 Encounter Code Description Active Date Diagnosis I89.0 Lymphedema, not elsewhere classified 02/14/2017 Yes L97.212 Non-pressure chronic ulcer of right calf with fat layer 02/14/2017 Yes exposed F01.50 Vascular dementia without behavioral disturbance 02/14/2017 Yes Inactive Problems Resolved Problems Electronic Signature(s) Signed: 02/14/2017 3:24:30 PM By: Christin Fudge MD, FACS Entered By: Christin Fudge on 02/14/2017 13:57:13 Donna Rosario ([id number]) -------------------------------------------------------------------------------- Progress Note Details Patient Name: Donna Rosario Date of Service: 02/14/2017 12:45 PM Medical Record Number: [id number] Patient Account Number: [id number] Date of Birth/Sex: Jan 23, 1923 (83 y.o. Female) Treating RN: Cornell Barman Primary Care Provider: Viviana Simpler Other Clinician: Referring Provider: Viviana Simpler Treating Provider/Extender: Frann Rider in Treatment: 0 Subjective Chief Complaint Information obtained from Patient Patient presents to the wound care center for a consult due non healing wound to her right lower extremity which she's had for about 6 weeks History of Present Illness (HPI) The following HPI elements were documented for the patient's wound: Location: swelling and ulceration on the right lower extremity Quality: Patient reports experiencing a dull pain to affected area(s). Severity: Patient states wound are getting worse. Duration: Patient has had the wound for < 8 weeks prior to presenting for treatment Timing: Pain in wound is Intermittent (comes and goes Context: The wound would happen gradually Modifying Factors: Other treatment(s) tried include:various types of wraps have been applied Associated Signs and Symptoms: Patient reports having increase swelling. 83 year old patient with a personal history of TIA, vascular dementia, delirium, atrial fibrillation, essential hypertension, hypothyroidism, nonrheumatic mitral valve disorder and osteoporosis was sent to see  [place] with weeping edema of the right lower extremity which has failed local care. She also has a past surgical history of right hip joint replacement and a left knee replacement in 2008. She is also had a thyroidectomy and a total hip arthroplasty. she has never been a smoker. Her PCP treated her with furosemide for her chronic diastolic CHF, Eliquis for her atrial fibrillation,and though the diagnosis of venous insufficiency was made I believe there are no pertinent workup available. Her power of attorney of family members not here at the bedside so we do not know details of how aggressive they want to be with therapy Wound History Patient presents with 1 open wound that has been present for approximately 6 weeks. Patient has been treating wound in the following manner: wrapping and aqUACEL.  Laboratory tests have not been performed in the last month. Patient reportedly has not tested positive for an antibiotic resistant organism. Patient reportedly has not tested positive for osteomyelitis. Patient reportedly has not had testing performed to evaluate circulation in the legs. Patient History Allergies Sulfa (Sulfonamide Antibiotics) LECRETIA, BUCZEK ([id number]) Family History Heart Disease - Mother, Father, Hypertension - Mother, Father, No family history of Cancer, Diabetes, Kidney Disease, Lung Disease, Seizures, Stroke, Thyroid Problems, Tuberculosis. Social History Never smoker, Marital Status - Widowed, Alcohol Use - Daily - wine, Drug Use - No History. Medical History Cardiovascular Patient has history of Arrhythmia - A-fib, Hypertension Endocrine Denies history of Type I Diabetes, Type II Diabetes Musculoskeletal Patient has history of Osteoarthritis Neurologic Patient has history of Dementia Denies history of Neuropathy, Quadriplegia, Paraplegia, Seizure Disorder Oncologic Denies history of Received Chemotherapy, Received Radiation Psychiatric Denies history of Anorexia/bulimia, Confinement Anxiety Medical And Surgical History Notes Constitutional Symptoms (General Health) Embolitic Stroke; Hyperlipidemia; HTN; Mitral Valve Disorder; Osteoporoses; Hypothyroidism; vascular dementia Musculoskeletal Chronic Low Back Pain Review of Systems (ROS) Oncologic The patient has no complaints or symptoms. Medications atorvastatin oral 1 1 tablet oral nightly Eliquis oral 1 1 tablet oral two times daily Vitamin D3 oral 1 1 capsule oral the 20th of every month Tums E-X 300 mg (750 mg) chewable tablet oral 2 2 tablet,chewable oral daily Zyrtec 10 mg tablet oral 1 1 tablet oral nightly for two weeks lisinopril 20 mg tablet oral 1 1 tablet oral nightly Lopressor 50 mg tablet oral 1 1 tablet oral two times daily alendronate 70 mg tablet oral 1 1 tablet oral weekly on  Sunday Briarcliff, Latriece ([id number]) felodipine ER 5 mg tablet,extended release 24 hr oral 1 1 tablet extended release 24 hr oral daily digoxin 125 mcg tablet oral 1 1 tablet oral daily Fish Oil 1,000 mg (120 mg-180 mg) capsule oral 1 1 capsule oral daily Lasix 40 mg tablet oral 1 1 tablet oral daily selenium 200 mcg tablet oral 1 1 tablet oral L-Lysine 500 mg tablet oral 1 1 tablet oral daily Armour Thyroid 60 mg tablet oral 1 1 tablet oral daily Vitamin C 500 mg tablet oral 1 1 tablet oral daily Objective Constitutional Pulse regular. Respirations normal and unlabored. Afebrile. Vitals Time Taken: 1:04 PM, Height: 63 in, Temperature: 97.8 F, Pulse: 55 bpm, Respiratory Rate: 16 breaths/min, Blood Pressure: 135/60 mmHg. Eyes Nonicteric. Reactive to light. Ears, Nose, Mouth, and Throat Lips, teeth, and gums WNL.Marland Kitchen Moist mucosa without lesions. Neck supple and nontender. No palpable supraclavicular or cervical adenopathy. Normal sized without goiter. Respiratory WNL. No retractions.. Cardiovascular Pedal Pulses WNL. ABI on the right was 1.14. No clubbing, cyanosis there is evidence of bilateral  stage II lymphedema right worse than the left. Gastrointestinal (GI) Abdomen without masses or tenderness.. No liver or spleen enlargement or tenderness.. Lymphatic No adneopathy. No adenopathy. No adenopathy. Musculoskeletal Adexa without tenderness or enlargement.. Digits and nails w/o clubbing, cyanosis, infection, petechiae, ischemia, or inflammatory conditions.Marland Kitchen Psychiatric Judgement and insight Intact.. No evidence of depression, anxiety, or agitation.Constance Goltz, Tamela Oddi ([id number]) General Notes: The lymphedema is more on the right as compared to the left. she has a lot of excoriation of the right lower extremity around the calf and shin but there is no evidence of cellulitis. No sharp debridement was required today. Integumentary (Hair, Skin) No suspicious lesions. No crepitus or  fluctuance. No peri-wound warmth or erythema. No masses.. Wound #1 status is Open. Original cause of wound was Gradually Appeared. The wound is located on the Left,Circumferential Lower Leg. The wound measures 17cm length x 28cm width x 0.1cm depth; 373.85cm^2 area and 37.385cm^3 volume. There is no tunneling or undermining noted. There is a large amount of serous drainage noted. The wound margin is indistinct and nonvisible. There is large (67-100%) granulation within the wound bed. There is no necrotic tissue within the wound bed. The periwound skin appearance exhibited: Excoriation. The periwound skin appearance did not exhibit: Callus, Crepitus, Induration, Rash, Scarring, Dry/Scaly, Maceration, Atrophie Blanche, Cyanosis, Ecchymosis, Hemosiderin Staining, Mottled, Pallor, Rubor, Erythema. Assessment Active Problems ICD-10 I89.0 - Lymphedema, not elsewhere classified L97.212 - Non-pressure chronic ulcer of right calf with fat layer exposed F01.50 - Vascular dementia without behavioral disturbance this 83 year old patient who is pleasantly demented and has no family member with her today has a caregiver who was not her nurse and does not know much about her history. We will try and get in touch with her family and see who has the power of attorney and how they want to proceed with further investigations. In the meanwhile I have recommended: 1. silver alginate and a 3 layer Profore wrap 2. elevation and exercise has been discussed with her 3. Ask her nursing staff to get in touch with her is regarding her contact, who has the power of attorney. 4. regular visits to the wound center Plan Wound Cleansing: ABBEGAIL, MATUSKA ([id number]) May Shower, gently pat wound dry prior to applying new dressing. May shower with protection. - can be removed before nurse is scheduled to change dressing. Skin Barriers/Peri-Wound Care: Wound #1 Left,Circumferential Lower Leg: Barrier cream Primary Wound  Dressing: Wound #1 Left,Circumferential Lower Leg: Aquacel Ag Secondary Dressing: Wound #1 Left,Circumferential Lower Leg: ABD pad Dressing Change Frequency: Change Dressing Monday, Wednesday, Friday Follow-up Appointments: Wound #1 Left,Circumferential Lower Leg: Return Appointment in 1 week. Edema Control: Wound #1 Left,Circumferential Lower Leg: 3 Layer Compression System - Right Lower Extremity Elevate legs to the level of the heart and pump ankles as often as possible - Do not sit with legs dangling down, please elevate. this 83 year old patient who is pleasantly demented and has no family member with her today has a caregiver who was not her nurse and does not know much about her history. We will try and get in touch with her family and see who has the power of attorney and how they want to proceed with further investigations. In the meanwhile I have recommended: 1. silver alginate and a 3 layer Profore wrap 2. elevation and exercise has been discussed with her 3. Ask her nursing staff to get in touch with her is regarding her contact, who has the power of attorney. 4. regular visits to the  wound center Electronic Signature(s) Signed: 02/14/2017 3:24:30 PM By: Christin Fudge MD, FACS Entered By: Christin Fudge on 02/14/2017 14:06:53 Donna Rosario ([id number]) -------------------------------------------------------------------------------- ROS/PFSH Details Patient Name: Donna Rosario Date of Service: 02/14/2017 12:45 PM Medical Record Number: [id number] Patient Account Number: [id number] Date of Birth/Sex: Mar 08, 1923 (83 y.o. Female) Treating RN: Cornell Barman Primary Care Provider: Viviana Simpler Other Clinician: Referring Provider: Viviana Simpler Treating Provider/Extender: Frann Rider in Treatment: 0 Wound History Do you currently have one or more open woundso Yes How many open wounds do you currently haveo 1 Approximately how long have you had your woundso 6  weeks How have you been treating your wound(s) until nowo wrapping and aqUACEL Has your wound(s) ever healed and then re-openedo No Have you had any lab work done in the past montho No Have you tested positive for an antibiotic resistant organism (MRSA, No VRE)o Have you tested positive for osteomyelitis (bone infection)o No Have you had any tests for circulation on your legso No Constitutional Symptoms (General Health) Medical History: Past Medical History Notes: Embolitic Stroke; Hyperlipidemia; HTN; Mitral Valve Disorder; Osteoporoses; Hypothyroidism; vascular dementia Cardiovascular Medical History: Positive for: Arrhythmia - A-fib; Hypertension Endocrine Medical History: Negative for: Type I Diabetes; Type II Diabetes Musculoskeletal Medical History: Positive for: Osteoarthritis Past Medical History Notes: Chronic Low Back Pain Neurologic Medical History: Positive for: Dementia Negative for: Neuropathy; Quadriplegia; Paraplegia; Seizure Disorder MAKAILEY, HODGKIN ([id number]) Oncologic Complaints and Symptoms: No Complaints or Symptoms Medical History: Negative for: Received Chemotherapy; Received Radiation Psychiatric Medical History: Negative for: Anorexia/bulimia; Confinement Anxiety Immunizations Pneumococcal Vaccine: Received Pneumococcal Vaccination: Yes Family and Social History Cancer: No; Diabetes: No; Heart Disease: Yes - Mother, Father; Hypertension: Yes - Mother, Father; Kidney Disease: No; Lung Disease: No; Seizures: No; Stroke: No; Thyroid Problems: No; Tuberculosis: No; Never smoker; Marital Status - Widowed; Alcohol Use: Daily - wine; Drug Use: No History; Financial Concerns: No; Food, Clothing or Shelter Needs: No; Support System Lacking: No; Transportation Concerns: No; Advanced Directives: No; Patient does not want information on Advanced Directives; Do not resuscitate: No; Living Will: Yes (Not Provided) Physician Affirmation I have reviewed and  agree with the above information. Electronic Signature(s) Signed: 02/14/2017 3:24:30 PM By: Christin Fudge MD, FACS Signed: 02/14/2017 7:53:58 PM By: Gretta Cool BSN, RN, CWS, Kim RN, BSN Previous Signature: 02/14/2017 9:18:28 AM Version By: Gretta Cool, BSN, RN, CWS, Kim RN, BSN Entered By: Gretta Cool, BSN, RN, CWS, Kim on 02/14/2017 13:18:10 Donna Rosario ([id number]) -------------------------------------------------------------------------------- SuperBill Details Patient Name: Donna Rosario Date of Service: 02/14/2017 Medical Record Number: [id number] Patient Account Number: [id number] Date of Birth/Sex: 1923-05-01 (83 y.o. Female) Treating RN: Cornell Barman Primary Care Provider: Viviana Simpler Other Clinician: Referring Provider: Viviana Simpler Treating Provider/Extender: Frann Rider in Treatment: 0 Diagnosis Coding ICD-10 Codes Code Description I89.0 Lymphedema, not elsewhere classified L97.212 Non-pressure chronic ulcer of right calf with fat layer exposed F01.50 Vascular dementia without behavioral disturbance Facility Procedures CPT4 Code: [id number] Description: 99213 - WOUND CARE VISIT-LEV 3 EST PT Modifier: Quantity: 1 Physician Procedures CPT4 Code: [id number] Description: 18841 - WC PHYS LEVEL 4 - NEW PT ICD-10 Description Diagnosis I89.0 Lymphedema, not elsewhere classified L97.212 Non-pressure chronic ulcer of right calf with fat F01.50 Vascular dementia without behavioral disturbance Modifier: layer exposed Quantity: 1 Electronic Signature(s) Signed: 02/14/2017 3:24:30 PM By: Christin Fudge MD, FACS Previous Signature: 02/14/2017 2:07:17 PM Version By: Christin Fudge MD, FACS Entered By: Christin Fudge on 02/14/2017 14:07:40

## 2017-02-16 NOTE — Progress Notes (Signed)
LAKEISA, HENINGER ([id number]) Visit Report for 02/14/2017 Abuse/Suicide Risk Screen Details Patient Name: Donna Rosario, Donna Rosario Date of Service: 02/14/2017 12:45 PM Medical Record Number: [id number] Patient Account Number: [id number] Date of Birth/Sex: 1922-11-29 (83 y.o. Female) Treating RN: Cornell Barman Primary Care Izabellah Dadisman: Viviana Simpler Other Clinician: Referring Sherilee Smotherman: Viviana Simpler Treating Fatumata Kashani/Extender: Frann Rider in Treatment: 0 Abuse/Suicide Risk Screen Items Answer ABUSE/SUICIDE RISK SCREEN: Has anyone close to you tried to hurt or harm you recentlyo No Do you feel uncomfortable with anyone in your familyo No Has anyone forced you do things that you didnot want to doo No Do you have any thoughts of harming yourselfo No Patient displays signs or symptoms of abuse and/or neglect. No Electronic Signature(s) Signed: 02/14/2017 7:53:58 PM By: Gretta Cool, BSN, RN, CWS, Kim RN, BSN Entered By: Gretta Cool, BSN, RN, CWS, Kim on 02/14/2017 13:18:19 Donna Rosario ([id number]) -------------------------------------------------------------------------------- Activities of Daily Living Details Patient Name: Donna Rosario Date of Service: 02/14/2017 12:45 PM Medical Record Number: [id number] Patient Account Number: [id number] Date of Birth/Sex: Sep 23, 1922 (83 y.o. Female) Treating RN: Cornell Barman Primary Care Nicloe Frontera: Viviana Simpler Other Clinician: Referring Larenzo Caples: Viviana Simpler Treating Laurieanne Galloway/Extender: Frann Rider in Treatment: 0 Activities of Daily Living Items Answer Activities of Daily Living (Please select one for each item) Drive Automobile Not Able Take Medications Not Able Use Telephone Need Assistance Care for Appearance Need Assistance Use Toilet Need Assistance Bath / Shower Need Assistance Dress Self Completely Able Feed Self Completely Able Walk Completely Able Get In / Out Bed Completely Able Housework Not Able Prepare Meals Not Able Handle Money Not  Able Shop for Self Not Able Electronic Signature(s) Signed: 02/14/2017 7:53:58 PM By: Gretta Cool, BSN, RN, CWS, Kim RN, BSN Entered By: Gretta Cool, BSN, RN, CWS, Kim on 02/14/2017 13:18:56 Donna Rosario ([id number]) -------------------------------------------------------------------------------- Education Assessment Details Patient Name: Donna Rosario Date of Service: 02/14/2017 12:45 PM Medical Record Number: [id number] Patient Account Number: [id number] Date of Birth/Sex: [birth year] (83 y.o. Female) Treating RN: Cornell Barman Primary Care Audric Venn: Viviana Simpler Other Clinician: Referring Nijee Heatwole: Viviana Simpler Treating Marcelino Campos/Extender: Frann Rider in Treatment: 0 Primary Learner Assessed: Patient Learning Preferences/Education Level/Primary Language Learning Preference: Explanation, Demonstration Highest Education Level: College or Above Preferred Language: English Cognitive Barrier Assessment/Beliefs Language Barrier: No Translator Needed: No Memory Deficit: No Emotional Barrier: No Cultural/Religious Beliefs Affecting Medical No Care: Physical Barrier Assessment Impaired Vision: Yes Impaired Hearing: No Decreased Hand dexterity: No Knowledge/Comprehension Assessment Knowledge Level: Medium Comprehension Level: Medium Ability to understand written Medium instructions: Ability to understand verbal Medium instructions: Motivation Assessment Anxiety Level: Calm Cooperation: Cooperative Education Importance: Acknowledges Need Interest in Health Problems: Asks Questions Perception: Coherent Willingness to Engage in Self- High Management Activities: Readiness to Engage in Self- High Management Activities: Electronic Signature(s) MATIE, DIMAANO ([id number]) Signed: 02/14/2017 7:53:58 PM By: Gretta Cool, BSN, RN, CWS, Kim RN, BSN Entered By: Gretta Cool, BSN, RN, CWS, Kim on 02/14/2017 13:19:44 TIYA, SCHRUPP  ([id number]) -------------------------------------------------------------------------------- Fall Risk Assessment Details Patient Name: Donna Rosario Date of Service: 02/14/2017 12:45 PM Medical Record Number: [id number] Patient Account Number: [id number] Date of Birth/Sex: [birth year] (83 y.o. Female) Treating RN: Cornell Barman Primary Care Jaquelyn Sakamoto: Viviana Simpler Other Clinician: Referring Kashis Penley: Viviana Simpler Treating Advaith Lamarque/Extender: Frann Rider in Treatment: 0 Fall Risk Assessment Items Have you had 2 or more falls in the last 12 monthso 0 No Have you had any fall that resulted in injury in the last 12 monthso 0 No FALL RISK ASSESSMENT: History of falling - immediate or within 3 months 0  No Secondary diagnosis 15 Yes Ambulatory aid None/bed rest/wheelchair/nurse 0 No Crutches/cane/walker 15 Yes Furniture 0 No IV Access/Saline Lock 0 No Gait/Training Normal/bed rest/immobile 0 Yes Weak 10 Yes Impaired 0 No Mental Status Oriented to own ability 0 No Electronic Signature(s) Signed: 02/14/2017 7:53:58 PM By: Gretta Cool, BSN, RN, CWS, Kim RN, BSN Entered By: Gretta Cool, BSN, RN, CWS, Kim on 02/14/2017 13:20:14 Donna Rosario ([id number]) -------------------------------------------------------------------------------- Nutrition Risk Assessment Details Patient Name: Donna Rosario Date of Service: 02/14/2017 12:45 PM Medical Record Number: [id number] Patient Account Number: [id number] Date of Birth/Sex: 27-Nov-1922 (83 y.o. Female) Treating RN: Cornell Barman Primary Care Carmin Dibartolo: Viviana Simpler Other Clinician: Referring Khaliq Turay: Viviana Simpler Treating Gillermo Poch/Extender: Frann Rider in Treatment: 0 Height (in): 63 Weight (lbs): Body Mass Index (BMI): Nutrition Risk Assessment Items NUTRITION RISK SCREEN: I have an illness or condition that made me change the kind and/or 0 No amount of food I eat I eat fewer than two meals per day 0 No I eat few fruits and  vegetables, or milk products 0 No I have three or more drinks of beer, liquor or wine almost every day 0 No I have tooth or mouth problems that make it hard for me to eat 0 No I don't always have enough money to buy the food I need 0 No I eat alone most of the time 0 No I take three or more different prescribed or over-the-counter drugs a 1 Yes day Without wanting to, I have lost or gained 10 pounds in the last six 0 No months I am not always physically able to shop, cook and/or feed myself 0 No Nutrition Protocols Good Risk Protocol 0 No interventions needed Moderate Risk Protocol Electronic Signature(s) Signed: 02/14/2017 7:53:58 PM By: Gretta Cool, BSN, RN, CWS, Kim RN, BSN Entered By: Gretta Cool, BSN, RN, CWS, Kim on 02/14/2017 13:20:35

## 2017-02-21 ENCOUNTER — Encounter: Payer: Medicare Other | Admitting: Surgery

## 2017-02-21 DIAGNOSIS — I890 Lymphedema, not elsewhere classified: Secondary | ICD-10-CM | POA: Diagnosis not present

## 2017-02-26 ENCOUNTER — Other Ambulatory Visit: Payer: Self-pay | Admitting: Surgery

## 2017-02-26 DIAGNOSIS — S81801A Unspecified open wound, right lower leg, initial encounter: Secondary | ICD-10-CM

## 2017-02-27 ENCOUNTER — Ambulatory Visit (INDEPENDENT_AMBULATORY_CARE_PROVIDER_SITE_OTHER): Payer: Medicare Other

## 2017-02-27 DIAGNOSIS — S81801A Unspecified open wound, right lower leg, initial encounter: Secondary | ICD-10-CM

## 2017-02-27 NOTE — Progress Notes (Signed)
[name], [name] ([id number]) Visit Report for 02/21/2017 Arrival Information Details Patient Name: Donna Rosario, Donna Rosario Date of Service: 02/21/2017 3:00 PM Medical Record Number: [id number] Patient Account Number: [id number] Date of Birth/Sex: [birth year] (83 y.o. Female) Treating RN: Cornell Barman Primary Care Amatullah Christy: Viviana Simpler Other Clinician: Referring Berdell Nevitt: Deborra Medina Treating Raygen Dahm/Extender: Frann Rider in Treatment: 1 Visit Information History Since Last Visit Added or deleted any medications: No Patient Arrived: Donna Rosario Any new allergies or adverse reactions: No Arrival Time: 15:09 Had a fall or experienced change in No Accompanied By: aide activities of daily living that may affect Transfer Assistance: None risk of falls: Patient Identification Verified: Yes Signs or symptoms of abuse/neglect since last No Secondary Verification Process Yes visito Completed: Hospitalized since last visit: No Patient Requires Transmission- No Has Dressing in Place as Prescribed: Yes Based Precautions: Pain Present Now: No Patient Has Alerts: Yes Patient Alerts: Patient on Blood Thinner Eliquis Not Diabetic Electronic Signature(s) Signed: 02/26/2017 5:20:16 PM By: Gretta Cool, BSN, RN, CWS, Kim RN, BSN Entered By: Gretta Cool, BSN, RN, CWS, Kim on 02/21/2017 15:09:46 Donna Rosario ([id number]) -------------------------------------------------------------------------------- Compression Therapy Details Patient Name: Donna Rosario Date of Service: 02/21/2017 3:00 PM Medical Record Number: [id number] Patient Account Number: [id number] Date of Birth/Sex: [birth year] (83 y.o. Female) Treating RN: Cornell Barman Primary Care Hayden Mabin: Viviana Simpler Other Clinician: Referring Paisli Silfies: Deborra Medina Treating Lynzee Lindquist/Extender: Frann Rider in Treatment: 1 Compression Therapy Performed for Wound Wound #1 Left,Circumferential Lower Leg Assessment: Performed By: Clinician Cornell Barman,  RN Compression Type: Three Layer Pre Treatment ABI: 1.1 Post Procedure Diagnosis Same as Pre-procedure Electronic Signature(s) Signed: 02/26/2017 5:20:16 PM By: Gretta Cool, BSN, RN, CWS, Kim RN, BSN Entered By: Gretta Cool, BSN, RN, CWS, Kim on 02/21/2017 15:53:37 Donna Rosario ([id number]) -------------------------------------------------------------------------------- Encounter Discharge Information Details Patient Name: Donna Rosario Date of Service: 02/21/2017 3:00 PM Medical Record Number: [id number] Patient Account Number: [id number] Date of Birth/Sex: 1922-12-24 (83 y.o. Female) Treating RN: Cornell Barman Primary Care Lashonne Shull: Viviana Simpler Other Clinician: Referring Kayshaun Polanco: Deborra Medina Treating Melville Engen/Extender: Frann Rider in Treatment: 1 Encounter Discharge Information Items Discharge Pain Level: 0 Discharge Condition: Stable Ambulatory Status: Walker Discharge Destination: Nursing Home Transportation: Other Accompanied By: aide Schedule Follow-up Appointment: Yes Medication Reconciliation completed Yes and provided to Patient/Care Candiace West: Provided on Clinical Summary of Care: 02/21/2017 Form Type Recipient Paper Patient DD Electronic Signature(s) Signed: 02/26/2017 5:20:16 PM By: Gretta Cool, BSN, RN, CWS, Kim RN, BSN Entered By: Gretta Cool, BSN, RN, CWS, Kim on 02/21/2017 15:55:19 Donna Rosario ([id number]) -------------------------------------------------------------------------------- Lower Extremity Assessment Details Patient Name: Donna Rosario Date of Service: 02/21/2017 3:00 PM Medical Record Number: [id number] Patient Account Number: [id number] Date of Birth/Sex: 05/14/1923 (83 y.o. Female) Treating RN: Cornell Barman Primary Care Kenric Ginger: Viviana Simpler Other Clinician: Referring Willet Schleifer: Deborra Medina Treating Christoffer Currier/Extender: Frann Rider in Treatment: 1 Edema Assessment Assessed: Donna Rosario: No] [Right: No] E[Left: dema] [Right: :] Calf Left:  Right: Point of Measurement: cm From Medial Instep cm 32.5 cm Ankle Left: Right: Point of Measurement: cm From Medial Instep cm 21.5 cm Vascular Assessment Pulses: Dorsalis Pedis Palpable: [Right:Yes] Posterior Tibial Extremity colors, hair growth, and conditions: Extremity Color: [Right:Red] Hair Growth on Extremity: [Right:No] Temperature of Extremity: [Right:Warm] Capillary Refill: [Right:< 3 seconds] Dependent Rubor: [Right:No] Blanched when Elevated: [Right:No] Lipodermatosclerosis: [Right:No] Toe Nail Assessment Left: Right: Thick: Yes Discolored: Yes Deformed: Yes Improper Length and Hygiene: Yes Electronic Signature(s) Signed: 02/26/2017 5:20:16 PM By: Gretta Cool, BSN, RN, CWS, Kim RN, BSN Donna Rosario, Donna Rosario ([id number]) Entered By: Gretta Cool, BSN, RN, CWS, Kim on  02/21/2017 15:20:36 Donna Rosario, Donna Rosario ([id number]) -------------------------------------------------------------------------------- Multi Wound Chart Details Patient Name: Donna Rosario, Donna Rosario Date of Service: 02/21/2017 3:00 PM Medical Record Number: [id number] Patient Account Number: [id number] Date of Birth/Sex: 1923-03-07 (83 y.o. Female) Treating RN: Cornell Barman Primary Care Caira Poche: Viviana Simpler Other Clinician: Referring Pistol Kessenich: Deborra Medina Treating Maxton Noreen/Extender: Frann Rider in Treatment: 1 Vital Signs Height(in): 63 Pulse(bpm): 64 Weight(lbs): Blood Pressure 125/68 (mmHg): Body Mass Index(BMI): Temperature(F): 98.1 Respiratory Rate 16 (breaths/min): Photos: [N/A:N/A] Wound Location: Left Lower Leg - N/A N/A Circumfernential Wounding Event: Gradually Appeared N/A N/A Primary Etiology: Lymphedema N/A N/A Comorbid History: Arrhythmia, Hypertension, N/A N/A Osteoarthritis, Dementia Date Acquired: 01/14/2017 N/A N/A Weeks of Treatment: 1 N/A N/A Wound Status: Open N/A N/A Clustered Wound: Yes N/A N/A Measurements L x W x D 17x16x0.1 N/A N/A (cm) Area (cm) : 213.628 N/A N/A Volume (cm) :  21.363 N/A N/A % Reduction in Area: 42.90% N/A N/A % Reduction in Volume: 42.90% N/A N/A Classification: Full Thickness Without N/A N/A Exposed Support Structures Exudate Amount: Large N/A N/A Exudate Type: Serous N/A N/A Exudate Color: amber N/A N/A Wound Margin: Indistinct, nonvisible N/A N/A Granulation Amount: Large (67-100%) N/A N/A Donna Rosario, Donna Rosario ([id number]) Necrotic Amount: None Present (0%) N/A N/A Exposed Structures: Fascia: No N/A N/A Fat Layer (Subcutaneous Tissue) Exposed: No Tendon: No Muscle: No Joint: No Bone: No Limited to Skin Breakdown Epithelialization: Large (67-100%) N/A N/A Periwound Skin Texture: Excoriation: Yes N/A N/A Induration: No Callus: No Crepitus: No Rash: No Scarring: No Periwound Skin Maceration: Yes N/A N/A Moisture: Dry/Scaly: No Periwound Skin Color: Atrophie Blanche: No N/A N/A Cyanosis: No Ecchymosis: No Erythema: No Hemosiderin Staining: No Mottled: No Pallor: No Rubor: No Tenderness on No N/A N/A Palpation: Wound Preparation: Ulcer Cleansing: N/A N/A Rinsed/Irrigated with Saline Topical Anesthetic Applied: None Treatment Notes Electronic Signature(s) Signed: 02/21/2017 4:28:32 PM By: Christin Fudge MD, FACS Entered By: Christin Fudge on 02/21/2017 15:29:21 Donna Rosario ([id number]) -------------------------------------------------------------------------------- Multi-Disciplinary Care Plan Details Patient Name: Donna Rosario Date of Service: 02/21/2017 3:00 PM Medical Record Number: [id number] Patient Account Number: [id number] Date of Birth/Sex: 05-30-1923 (83 y.o. Female) Treating RN: Cornell Barman Primary Care Philo Kurtz: Viviana Simpler Other Clinician: Referring Kymoni Lesperance: Deborra Medina Treating Elease Swarm/Extender: Frann Rider in Treatment: 1 Active Inactive ` Orientation to the Wound Care Program Nursing Diagnoses: Knowledge deficit related to the wound healing center program Goals: Patient/caregiver will  verbalize understanding of the [facility] Date Initiated: 02/14/2017 Target Resolution Date: 02/25/2017 Goal Status: Active Interventions: Provide education on orientation to the wound center Notes: ` Wound/Skin Impairment Nursing Diagnoses: Knowledge deficit related to ulceration/compromised skin integrity Goals: Ulcer/skin breakdown will heal within 14 weeks Date Initiated: 02/14/2017 Target Resolution Date: 02/25/2017 Goal Status: Active Interventions: Assess ulceration(s) every visit Treatment Activities: Skin care regimen initiated : 02/14/2017 Topical wound management initiated : 02/14/2017 Notes: Electronic Signature(s) Donna Rosario, Donna Rosario ([id number]) Signed: 02/26/2017 5:20:16 PM By: Gretta Cool, BSN, RN, CWS, Kim RN, BSN Entered By: Gretta Cool, BSN, RN, CWS, Kim on 02/21/2017 15:21:30 Donna Rosario ([id number]) -------------------------------------------------------------------------------- Pain Assessment Details Patient Name: Donna Rosario Date of Service: 02/21/2017 3:00 PM Medical Record Number: [id number] Patient Account Number: [id number] Date of Birth/Sex: 04/21/1923 (83 y.o. Female) Treating RN: Cornell Barman Primary Care Estus Krakowski: Viviana Simpler Other Clinician: Referring Lalita Ebel: Deborra Medina Treating Muaaz Brau/Extender: Frann Rider in Treatment: 1 Active Problems Location of Pain Severity and Description of Pain Patient Has Paino No Site Locations With Dressing Change: No Pain Management and Medication Current Pain Management: Goals for Pain Management  Topical or injectable lidocaine is offered to patient for acute pain when surgical debridement is performed. If needed, Patient is instructed to use over the counter pain medication for the following 24-48 hours after debridement. Wound care MDs do not prescribed pain medications. Patient has chronic pain or uncontrolled pain. Patient has been instructed to make an appointment with their Primary Care  Physician for pain management. Electronic Signature(s) Signed: 02/26/2017 5:20:16 PM By: Gretta Cool, BSN, RN, CWS, Kim RN, BSN Entered By: Gretta Cool, BSN, RN, CWS, Kim on 02/21/2017 15:09:56 Donna Rosario ([id number]) -------------------------------------------------------------------------------- Patient/Caregiver Education Details Patient Name: Donna Rosario Date of Service: 02/21/2017 3:00 PM Medical Record Number: [id number] Patient Account Number: [id number] Date of Birth/Gender: 04-02-1923 (83 y.o. Female) Treating RN: Cornell Barman Primary Care Physician: Viviana Simpler Other Clinician: Referring Physician: Deborra Medina Treating Physician/Extender: Frann Rider in Treatment: 1 Education Assessment Education Provided To: Patient Education Topics Provided Venous: Handouts: Controlling Swelling with Multilayered Compression Wraps Methods: Demonstration Responses: State content correctly Electronic Signature(s) Signed: 02/26/2017 5:20:16 PM By: Gretta Cool, BSN, RN, CWS, Kim RN, BSN Entered By: Gretta Cool, BSN, RN, CWS, Kim on 02/21/2017 15:55:45 Donna Rosario ([id number]) -------------------------------------------------------------------------------- Wound Assessment Details Patient Name: Donna Rosario Date of Service: 02/21/2017 3:00 PM Medical Record Number: [id number] Patient Account Number: [id number] Date of Birth/Sex: 1923-05-29 (83 y.o. Female) Treating RN: Cornell Barman Primary Care Berklee Battey: Viviana Simpler Other Clinician: Referring Darcus Edds: Deborra Medina Treating Valentin Benney/Extender: Frann Rider in Treatment: 1 Wound Status Wound Number: 1 Primary Lymphedema Etiology: Wound Location: Left Lower Leg - Circumfernential Wound Status: Open Wounding Event: Gradually Appeared Comorbid Arrhythmia, Hypertension, History: Osteoarthritis, Dementia Date Acquired: 01/14/2017 Weeks Of Treatment: 1 Clustered Wound: Yes Photos Wound Measurements Length: (cm) 17 Width: (cm) 16 Depth:  (cm) 0.1 Area: (cm) 213.628 Volume: (cm) 21.363 % Reduction in Area: 42.9% % Reduction in Volume: 42.9% Epithelialization: Large (67-100%) Tunneling: No Undermining: No Wound Description Full Thickness Without Exposed Classification: Support Structures Wound Margin: Indistinct, nonvisible Exudate Large Amount: Exudate Type: Serous Exudate Color: amber Foul Odor After Cleansing: No Slough/Fibrino No Wound Bed Granulation Amount: Large (67-100%) Exposed Structure Necrotic Amount: None Present (0%) Fascia Exposed: No Fat Layer (Subcutaneous Tissue) Exposed: No Tendon Exposed: No Muscle Exposed: No Donna Rosario, Donna Rosario ([id number]) Joint Exposed: No Bone Exposed: No Limited to Skin Breakdown Periwound Skin Texture Texture Color No Abnormalities Noted: No No Abnormalities Noted: No Callus: No Atrophie Blanche: No Crepitus: No Cyanosis: No Excoriation: Yes Ecchymosis: No Induration: No Erythema: No Rash: No Hemosiderin Staining: No Scarring: No Mottled: No Pallor: No Moisture Rubor: No No Abnormalities Noted: No Dry / Scaly: No Maceration: Yes Wound Preparation Ulcer Cleansing: Rinsed/Irrigated with Saline Topical Anesthetic Applied: None Treatment Notes Wound #1 (Left, Circumferential Lower Leg) 1. Cleansed with: Clean wound with Normal Saline Cleanse wound with antibacterial soap and water 3. Peri-wound Care: Barrier cream 4. Dressing Applied: Aquacel Ag 5. Secondary Dressing Applied ABD Pad 7. Secured with 3 Layer Compression System - Right Lower Extremity Electronic Signature(s) Signed: 02/26/2017 5:20:16 PM By: Gretta Cool, BSN, RN, CWS, Kim RN, BSN Entered By: Gretta Cool, BSN, RN, CWS, Kim on 02/21/2017 15:18:28 Donna Rosario ([id number]) -------------------------------------------------------------------------------- [place] Details Patient Name: Donna Rosario Date of Service: 02/21/2017 3:00 PM Medical Record Number: [id number] Patient Account Number:  [id number] Date of Birth/Sex: 1923-03-29 (83 y.o. Female) Treating RN: Cornell Barman Primary Care Dejour Vos: Viviana Simpler Other Clinician: Referring Zayda Angell: Deborra Medina Treating Cadey Bazile/Extender: Frann Rider in Treatment: 1 Vital Signs Time Taken: 15:10 Temperature (F): 98.1 Height (in): 63 Pulse (bpm): 64  Respiratory Rate (breaths/min): 16 Blood Pressure (mmHg): 125/68 Reference Range: 80 - 120 mg / dl Electronic Signature(s) Signed: 02/26/2017 5:20:16 PM By: Gretta Cool, BSN, RN, CWS, Kim RN, BSN Entered By: Gretta Cool, BSN, RN, CWS, Kim on 02/21/2017 15:10:25

## 2017-02-27 NOTE — Progress Notes (Signed)
Donna Rosario, Donna Rosario ([id number]) Visit Report for 02/21/2017 Chief Complaint Document Details Patient Name: Donna Rosario, Donna Rosario Date of Service: 02/21/2017 3:00 PM Medical Record Number: [id number] Patient Account Number: [id number] Date of Birth/Sex: [birth year] (83 y.o. Female) Treating RN: Cornell Barman Primary Care Provider: Viviana Simpler Other Clinician: Referring Provider: Deborra Medina Treating Provider/Extender: Frann Rider in Treatment: 1 Information Obtained from: Patient Chief Complaint Patient presents to the wound care center for a consult due non healing wound to her right lower extremity which she's had for about 6 weeks Electronic Signature(s) Signed: 02/21/2017 4:28:32 PM By: Christin Fudge MD, FACS Entered By: Christin Fudge on 02/21/2017 15:29:28 Donna Rosario ([id number]) -------------------------------------------------------------------------------- HPI Details Patient Name: Donna Rosario Date of Service: 02/21/2017 3:00 PM Medical Record Number: [id number] Patient Account Number: [id number] Date of Birth/Sex: 1923/04/01 (83 y.o. Female) Treating RN: Cornell Barman Primary Care Provider: Viviana Simpler Other Clinician: Referring Provider: Deborra Medina Treating Provider/Extender: Frann Rider in Treatment: 1 History of Present Illness Location: swelling and ulceration on the right lower extremity Quality: Patient reports experiencing a dull pain to affected area(s). Severity: Patient states wound are getting worse. Duration: Patient has had the wound for < 8 weeks prior to presenting for treatment Timing: Pain in wound is Intermittent (comes and goes Context: The wound would happen gradually Modifying Factors: Other treatment(s) tried include:various types of wraps have been applied Associated Signs and Symptoms: Patient reports having increase swelling. HPI Description: 83 year old patient with a personal history of TIA, vascular dementia, delirium,  atrial fibrillation, essential hypertension, hypothyroidism, nonrheumatic mitral valve disorder and osteoporosis was sent to see [place] with weeping edema of the right lower extremity which has failed local care. She also has a past surgical history of right hip joint replacement and a left knee replacement in 2008. She is also had a thyroidectomy and a total hip arthroplasty. she has never been a smoker. Her PCP treated her with furosemide for her chronic diastolic CHF, Eliquis for her atrial fibrillation,and though the diagnosis of venous insufficiency was made I believe there are no pertinent workup available. Her power of attorney of family members not here at the bedside so we do not know details of how aggressive they want to be with therapy. 02/21/2017 -- the patient's daughter, who has the power of attorney, spoke to my clinical nurse manager who discussed the question whether we should proceed with venous duplex reflux studies and the daughter wanted [place] to go ahead and do the requisite tests Electronic Signature(s) Signed: 02/21/2017 4:28:32 PM By: Christin Fudge MD, FACS Entered By: Christin Fudge on 02/21/2017 15:29:32 Donna Rosario ([id number]) -------------------------------------------------------------------------------- Physical Exam Details Patient Name: Donna Rosario Date of Service: 02/21/2017 3:00 PM Medical Record Number: [id number] Patient Account Number: [id number] Date of Birth/Sex: Jun 22, 1923 (83 y.o. Female) Treating RN: Cornell Barman Primary Care Provider: Viviana Simpler Other Clinician: Referring Provider: Deborra Medina Treating Provider/Extender: Frann Rider in Treatment: 1 Constitutional . Pulse regular. Respirations normal and unlabored. Afebrile. . Eyes Nonicteric. Reactive to light. Ears, Nose, Mouth, and Throat Lips, teeth, and gums WNL.Marland Kitchen Moist mucosa without lesions. Neck supple and nontender. No palpable supraclavicular or cervical adenopathy. Normal  sized without goiter. Respiratory WNL. No retractions.. Cardiovascular Pedal Pulses WNL. No clubbing, cyanosis or edema. Lymphatic No adneopathy. No adenopathy. No adenopathy. Musculoskeletal Adexa without tenderness or enlargement.. Digits and nails w/o clubbing, cyanosis, infection, petechiae, ischemia, or inflammatory conditions.. Integumentary (Hair, Skin) No suspicious lesions. No crepitus or fluctuance. No peri-wound warmth or erythema. No masses.Marland Kitchen Psychiatric Judgement and  insight Intact.. No evidence of depression, anxiety, or agitation.. Notes the lymphedema is a bit better and the excoriation is much less as compared to last week. There is no evidence of cellulitis. No debridement was required today. Electronic Signature(s) Signed: 02/21/2017 4:28:32 PM By: Christin Fudge MD, FACS Entered By: Christin Fudge on 02/21/2017 15:30:00 Donna Rosario ([id number]) -------------------------------------------------------------------------------- Physician Orders Details Patient Name: Donna Rosario Date of Service: 02/21/2017 3:00 PM Medical Record Number: [id number] Patient Account Number: [id number] Date of Birth/Sex: [birth year] (83 y.o. Female) Treating RN: Cornell Barman Primary Care Provider: Viviana Simpler Other Clinician: Referring Provider: Deborra Medina Treating Provider/Extender: Frann Rider in Treatment: 1 Verbal / Phone Orders: No Diagnosis Coding Wound Cleansing Wound #1 Left,Circumferential Lower Leg o May Shower, gently pat wound dry prior to applying new dressing. o May shower with protection. - can be removed before nurse is scheduled to change dressing. Skin Barriers/Peri-Wound Care Wound #1 Left,Circumferential Lower Leg o Barrier cream Primary Wound Dressing Wound #1 Left,Circumferential Lower Leg o Aquacel Ag Secondary Dressing Wound #1 Left,Circumferential Lower Leg o ABD pad Dressing Change Frequency Wound #1 Left,Circumferential Lower  Leg o Change Dressing Monday, Wednesday, Friday Follow-up Appointments Wound #1 Left,Circumferential Lower Leg o Return Appointment in 1 week. Edema Control Wound #1 Left,Circumferential Lower Leg o 3 Layer Compression System - Right Lower Extremity - Please wrap from base of toes to 3 finger widths below knee. o Elevate legs to the level of the heart and pump ankles as often as possible - Do not sit with legs dangling down, please elevate. Services and Therapies o DME provider, dressing supplies - Compression Stockings Donna Rosario, Donna Rosario ([id number]) o Venous Studies -Unilateral - Right Venous Reflex Electronic Signature(s) Signed: 02/21/2017 4:28:32 PM By: Christin Fudge MD, FACS Signed: 02/26/2017 5:20:16 PM By: Gretta Cool, BSN, RN, CWS, Kim RN, BSN Entered By: Gretta Cool, BSN, RN, CWS, Kim on 02/21/2017 15:41:02 Donna Rosario ([id number]) -------------------------------------------------------------------------------- Problem List Details Patient Name: Donna Rosario Date of Service: 02/21/2017 3:00 PM Medical Record Number: [id number] Patient Account Number: [id number] Date of Birth/Sex: Jul 15, 1922 (83 y.o. Female) Treating RN: Cornell Barman Primary Care Provider: Viviana Simpler Other Clinician: Referring Provider: Deborra Medina Treating Provider/Extender: Frann Rider in Treatment: 1 Active Problems ICD-10 Encounter Code Description Active Date Diagnosis I89.0 Lymphedema, not elsewhere classified 02/14/2017 Yes L97.212 Non-pressure chronic ulcer of right calf with fat layer 02/14/2017 Yes exposed F01.50 Vascular dementia without behavioral disturbance 02/14/2017 Yes Inactive Problems Resolved Problems Electronic Signature(s) Signed: 02/21/2017 4:28:32 PM By: Christin Fudge MD, FACS Entered By: Christin Fudge on 02/21/2017 15:29:17 Donna Rosario ([id number]) -------------------------------------------------------------------------------- Progress Note Details Patient Name:  Donna Rosario Date of Service: 02/21/2017 3:00 PM Medical Record Number: [id number] Patient Account Number: [id number] Date of Birth/Sex: 12/23/1922 (83 y.o. Female) Treating RN: Cornell Barman Primary Care Provider: Viviana Simpler Other Clinician: Referring Provider: Deborra Medina Treating Provider/Extender: Frann Rider in Treatment: 1 Subjective Chief Complaint Information obtained from Patient Patient presents to the wound care center for a consult due non healing wound to her right lower extremity which she's had for about 6 weeks History of Present Illness (HPI) The following HPI elements were documented for the patient's wound: Location: swelling and ulceration on the right lower extremity Quality: Patient reports experiencing a dull pain to affected area(s). Severity: Patient states wound are getting worse. Duration: Patient has had the wound for < 8 weeks prior to presenting for treatment Timing: Pain in wound is Intermittent (comes and goes Context: The wound would happen gradually Modifying Factors: Other  treatment(s) tried include:various types of wraps have been applied Associated Signs and Symptoms: Patient reports having increase swelling. 83 year old patient with a personal history of TIA, vascular dementia, delirium, atrial fibrillation, essential hypertension, hypothyroidism, nonrheumatic mitral valve disorder and osteoporosis was sent to see [place] with weeping edema of the right lower extremity which has failed local care. She also has a past surgical history of right hip joint replacement and a left knee replacement in 2008. She is also had a thyroidectomy and a total hip arthroplasty. she has never been a smoker. Her PCP treated her with furosemide for her chronic diastolic CHF, Eliquis for her atrial fibrillation,and though the diagnosis of venous insufficiency was made I believe there are no pertinent workup available. Her power of attorney of family members not  here at the bedside so we do not know details of how aggressive they want to be with therapy. 02/21/2017 -- the patient's daughter, who has the power of attorney, spoke to my clinical nurse manager who discussed the question whether we should proceed with venous duplex reflux studies and the daughter wanted [place] to go ahead and do the requisite tests Donna Rosario, Donna Rosario ([id number]) Objective Constitutional Pulse regular. Respirations normal and unlabored. Afebrile. Vitals Time Taken: 3:10 PM, Height: 63 in, Temperature: 98.1 F, Pulse: 64 bpm, Respiratory Rate: 16 breaths/min, Blood Pressure: 125/68 mmHg. Eyes Nonicteric. Reactive to light. Ears, Nose, Mouth, and Throat Lips, teeth, and gums WNL.Marland Kitchen Moist mucosa without lesions. Neck supple and nontender. No palpable supraclavicular or cervical adenopathy. Normal sized without goiter. Respiratory WNL. No retractions.. Cardiovascular Pedal Pulses WNL. No clubbing, cyanosis or edema. Lymphatic No adneopathy. No adenopathy. No adenopathy. Musculoskeletal Adexa without tenderness or enlargement.. Digits and nails w/o clubbing, cyanosis, infection, petechiae, ischemia, or inflammatory conditions.Marland Kitchen Psychiatric Judgement and insight Intact.. No evidence of depression, anxiety, or agitation.. General Notes: the lymphedema is a bit better and the excoriation is much less as compared to last week. There is no evidence of cellulitis. No debridement was required today. Integumentary (Hair, Skin) No suspicious lesions. No crepitus or fluctuance. No peri-wound warmth or erythema. No masses.. Wound #1 status is Open. Original cause of wound was Gradually Appeared. The wound is located on the Left,Circumferential Lower Leg. The wound measures 17cm length x 16cm width x 0.1cm depth; 213.628cm^2 area and 21.363cm^3 volume. The wound is limited to skin breakdown. There is no tunneling or undermining noted. There is a large amount of serous drainage noted. The  wound margin is indistinct and nonvisible. There is large (67-100%) granulation within the wound bed. There is no necrotic tissue within the wound bed. The periwound skin appearance exhibited: Excoriation, Maceration. The periwound skin appearance did not exhibit: Callus, Crepitus, Induration, Rash, Scarring, Dry/Scaly, Glori, Machnik, [place]iko ([id number]) Cyanosis, Ecchymosis, Hemosiderin Staining, Mottled, Pallor, Rubor, Erythema. Assessment Active Problems ICD-10 I89.0 - Lymphedema, not elsewhere classified L97.212 - Non-pressure chronic ulcer of right calf with fat layer exposed F01.50 - Vascular dementia without behavioral disturbance Procedures Wound #1 Pre-procedure diagnosis of Wound #1 is a Lymphedema located on the Left,Circumferential Lower Leg . There was a Three Layer Compression Therapy Procedure with a pre-treatment ABI of 1.1 by Cornell Barman, RN. Post procedure Diagnosis Wound #1: Same as Pre-Procedure Plan Wound Cleansing: Wound #1 Left,Circumferential Lower Leg: May Shower, gently pat wound dry prior to applying new dressing. May shower with protection. - can be removed before nurse is scheduled to change dressing. Skin Barriers/Peri-Wound Care: Wound #1 Left,Circumferential Lower Leg: Barrier cream Primary Wound Dressing:  Wound #1 Left,Circumferential Lower Leg: Aquacel Ag Secondary Dressing: Wound #1 Left,Circumferential Lower Leg: ABD pad Dressing Change Frequency: Wound #1 Left,Circumferential Lower Leg: Donna Rosario, Donna Rosario ([id number]) Change Dressing Monday, Wednesday, Friday Follow-up Appointments: Wound #1 Left,Circumferential Lower Leg: Return Appointment in 1 week. Edema Control: Wound #1 Left,Circumferential Lower Leg: 3 Layer Compression System - Right Lower Extremity - Please wrap from base of toes to 3 finger widths below knee. Elevate legs to the level of the heart and pump ankles as often as possible - Do not sit with legs dangling down,  please elevate. Services and Therapies ordered were: DME provider, dressing supplies - Compression Stockings, Venous Studies -Unilateral - Right Venous Reflex The patient's daughter, who has the power of attorney, wants [place] to proceed with further investigations. In the meanwhile I have recommended: 1. silver alginate and a 3 layer Profore wrap 2. elevation and exercise has been discussed with her 3. venous duplex reflux studies to be ordered both lower extremities 4. regular visits to the wound center Electronic Signature(s) Signed: 02/22/2017 3:31:36 PM By: Christin Fudge MD, FACS Previous Signature: 02/21/2017 4:28:32 PM Version By: Christin Fudge MD, FACS Entered By: Christin Fudge on 02/21/2017 16:30:26 Donna Rosario ([id number]) -------------------------------------------------------------------------------- SuperBill Details Patient Name: Donna Rosario Date of Service: 02/21/2017 Medical Record Number: [id number] Patient Account Number: [id number] Date of Birth/Sex: March 03, 1923 (83 y.o. Female) Treating RN: Cornell Barman Primary Care Provider: Viviana Simpler Other Clinician: Referring Provider: Deborra Medina Treating Provider/Extender: Frann Rider in Treatment: 1 Diagnosis Coding ICD-10 Codes Code Description I89.0 Lymphedema, not elsewhere classified L97.212 Non-pressure chronic ulcer of right calf with fat layer exposed F01.50 Vascular dementia without behavioral disturbance Facility Procedures CPT4: Description Modifier Quantity Code [id number] (Facility Use Only) [phone] - APPLY [facility] RT 1 LEG Physician Procedures CPT4 Code: [id number] Description: 78588 - WC PHYS LEVEL 3 - EST PT ICD-10 Description Diagnosis I89.0 Lymphedema, not elsewhere classified L97.212 Non-pressure chronic ulcer of right calf with fat F01.50 Vascular dementia without behavioral disturbance Modifier: layer exposed Quantity: 1 Electronic Signature(s) Signed: 02/21/2017 4:28:32 PM By: Christin Fudge MD, FACS Signed: 02/26/2017 5:20:16 PM By: Gretta Cool, BSN, RN, CWS, Kim RN, BSN Entered By: Gretta Cool, BSN, RN, CWS, Kim on 02/21/2017 15:54:02

## 2017-03-01 ENCOUNTER — Encounter: Payer: Medicare Other | Attending: Surgery | Admitting: Surgery

## 2017-03-01 DIAGNOSIS — I83013 Varicose veins of right lower extremity with ulcer of ankle: Secondary | ICD-10-CM | POA: Insufficient documentation

## 2017-03-01 DIAGNOSIS — M545 Low back pain: Secondary | ICD-10-CM | POA: Diagnosis not present

## 2017-03-01 DIAGNOSIS — I4891 Unspecified atrial fibrillation: Secondary | ICD-10-CM | POA: Insufficient documentation

## 2017-03-01 DIAGNOSIS — M1990 Unspecified osteoarthritis, unspecified site: Secondary | ICD-10-CM | POA: Diagnosis not present

## 2017-03-01 DIAGNOSIS — Z7901 Long term (current) use of anticoagulants: Secondary | ICD-10-CM | POA: Diagnosis not present

## 2017-03-01 DIAGNOSIS — I890 Lymphedema, not elsewhere classified: Secondary | ICD-10-CM | POA: Insufficient documentation

## 2017-03-01 DIAGNOSIS — E039 Hypothyroidism, unspecified: Secondary | ICD-10-CM | POA: Diagnosis not present

## 2017-03-01 DIAGNOSIS — M810 Age-related osteoporosis without current pathological fracture: Secondary | ICD-10-CM | POA: Insufficient documentation

## 2017-03-01 DIAGNOSIS — I110 Hypertensive heart disease with heart failure: Secondary | ICD-10-CM | POA: Diagnosis not present

## 2017-03-01 DIAGNOSIS — F0150 Vascular dementia without behavioral disturbance: Secondary | ICD-10-CM | POA: Diagnosis not present

## 2017-03-01 DIAGNOSIS — Z882 Allergy status to sulfonamides status: Secondary | ICD-10-CM | POA: Diagnosis not present

## 2017-03-01 DIAGNOSIS — L97212 Non-pressure chronic ulcer of right calf with fat layer exposed: Secondary | ICD-10-CM | POA: Diagnosis not present

## 2017-03-01 DIAGNOSIS — Z96652 Presence of left artificial knee joint: Secondary | ICD-10-CM | POA: Diagnosis not present

## 2017-03-01 DIAGNOSIS — Z8673 Personal history of transient ischemic attack (TIA), and cerebral infarction without residual deficits: Secondary | ICD-10-CM | POA: Diagnosis not present

## 2017-03-01 DIAGNOSIS — I83012 Varicose veins of right lower extremity with ulcer of calf: Secondary | ICD-10-CM | POA: Insufficient documentation

## 2017-03-01 DIAGNOSIS — Z79899 Other long term (current) drug therapy: Secondary | ICD-10-CM | POA: Insufficient documentation

## 2017-03-01 DIAGNOSIS — Z8249 Family history of ischemic heart disease and other diseases of the circulatory system: Secondary | ICD-10-CM | POA: Insufficient documentation

## 2017-03-01 DIAGNOSIS — I5032 Chronic diastolic (congestive) heart failure: Secondary | ICD-10-CM | POA: Insufficient documentation

## 2017-03-03 NOTE — Progress Notes (Signed)
VONDRA, ALDREDGE ([id number]) Visit Report for 03/01/2017 Chief Complaint Document Details Patient Name: Donna Rosario, Donna Rosario Date of Service: 03/01/2017 2:00 PM Medical Record Number: [id number] Patient Account Number: [id number] Date of Birth/Sex: Jun 24, 1923 (83 y.o. Female) Treating RN: Cornell Barman Primary Care Provider: Viviana Simpler Other Clinician: Referring Provider: Viviana Simpler Treating Provider/Extender: Frann Rider in Treatment: 2 Information Obtained from: Patient Chief Complaint Patient presents to the wound care center for a consult due non healing wound to her right lower extremity which she's had for about 6 weeks Electronic Signature(s) Signed: 03/01/2017 3:48:14 PM By: Christin Fudge MD, FACS Entered By: Christin Fudge on 03/01/2017 14:31:45 Donna Rosario ([id number]) -------------------------------------------------------------------------------- HPI Details Patient Name: Donna Rosario Date of Service: 03/01/2017 2:00 PM Medical Record Number: [id number] Patient Account Number: [id number] Date of Birth/Sex: 1922-11-12 (83 y.o. Female) Treating RN: Cornell Barman Primary Care Provider: Viviana Simpler Other Clinician: Referring Provider: Viviana Simpler Treating Provider/Extender: Frann Rider in Treatment: 2 History of Present Illness Location: swelling and ulceration on the right lower extremity Quality: Patient reports experiencing a dull pain to affected area(s). Severity: Patient states wound are getting worse. Duration: Patient has had the wound for < 8 weeks prior to presenting for treatment Timing: Pain in wound is Intermittent (comes and goes Context: The wound would happen gradually Modifying Factors: Other treatment(s) tried include:various types of wraps have been applied Associated Signs and Symptoms: Patient reports having increase swelling. HPI Description: 83 year old patient with a personal history of TIA, vascular dementia, delirium,  atrial fibrillation, essential hypertension, hypothyroidism, nonrheumatic mitral valve disorder and osteoporosis was sent to see [place] with weeping edema of the right lower extremity which has failed local care. She also has a past surgical history of right hip joint replacement and a left knee replacement in 2008. She is also had a thyroidectomy and a total hip arthroplasty. she has never been a smoker. Her PCP treated her with furosemide for her chronic diastolic CHF, Eliquis for her atrial fibrillation,and though the diagnosis of venous insufficiency was made I believe there are no pertinent workup available. Her power of attorney of family members not here at the bedside so we do not know details of how aggressive they want to be with therapy. 02/21/2017 -- the patient's daughter, who has the power of attorney, spoke to my clinical nurse manager who discussed the question whether we should proceed with venous duplex reflux studies and the daughter wanted [place] to go ahead and do the requisite tests. 03/01/2017 -- had a lower extremity venous reflux examination done which showed no evidence of DVT or SVT. There was incompetence of the right deep venous system, incompetence of the right great saphenous vein and incompetence of the right small saphenous vein. The left lower extremity study was not done With this result I would definitely recommend a vascular surgeon's opinion for possible endovenous ablation and the patient's power of attorney and I will have a conversation regarding this. Electronic Signature(s) Signed: 03/01/2017 3:48:14 PM By: Christin Fudge MD, FACS Entered By: Christin Fudge on 03/01/2017 14:32:43 Donna Rosario ([id number]) -------------------------------------------------------------------------------- Physical Exam Details Patient Name: Donna Rosario Date of Service: 03/01/2017 2:00 PM Medical Record Number: [id number] Patient Account Number: [id number] Date of Birth/Sex:  May 07, 1923 (83 y.o. Female) Treating RN: Cornell Barman Primary Care Provider: Viviana Simpler Other Clinician: Referring Provider: Viviana Simpler Treating Provider/Extender: Frann Rider in Treatment: 2 Constitutional . Pulse regular. Respirations normal and unlabored. Afebrile. . Eyes Nonicteric. Reactive to light. Ears, Nose, Mouth, and Throat Lips, teeth,  and gums WNL.Marland Kitchen Moist mucosa without lesions. Neck supple and nontender. No palpable supraclavicular or cervical adenopathy. Normal sized without goiter. Respiratory WNL. No retractions.. Cardiovascular Pedal Pulses WNL. No clubbing, cyanosis or edema. Lymphatic No adneopathy. No adenopathy. No adenopathy. Musculoskeletal Adexa without tenderness or enlargement.. Digits and nails w/o clubbing, cyanosis, infection, petechiae, ischemia, or inflammatory conditions.. Integumentary (Hair, Skin) No suspicious lesions. No crepitus or fluctuance. No peri-wound warmth or erythema. No masses.Marland Kitchen Psychiatric Judgement and insight Intact.. No evidence of depression, anxiety, or agitation.. Notes the right lower extremity has significant decrease in the weeping and ulceration but the compression had slipped midway to the calf. No sharp debridement was required Electronic Signature(s) Signed: 03/01/2017 3:48:14 PM By: Christin Fudge MD, FACS Entered By: Christin Fudge on 03/01/2017 14:33:29 Donna Rosario ([id number]) -------------------------------------------------------------------------------- Physician Orders Details Patient Name: Donna Rosario Date of Service: 03/01/2017 2:00 PM Medical Record Number: [id number] Patient Account Number: [id number] Date of Birth/Sex: [birth year] (83 y.o. Female) Treating RN: Cornell Barman Primary Care Provider: Viviana Simpler Other Clinician: Referring Provider: Viviana Simpler Treating Provider/Extender: Frann Rider in Treatment: 2 Verbal / Phone Orders: No Diagnosis Coding Wound  Cleansing Wound #1 Right,Circumferential Lower Leg o May Shower, gently pat wound dry prior to applying new dressing. o May shower with protection. - can be removed before nurse is scheduled to change dressing. Skin Barriers/Peri-Wound Care Wound #1 Right,Circumferential Lower Leg o Barrier cream Primary Wound Dressing Wound #1 Right,Circumferential Lower Leg o Aquacel Ag Secondary Dressing Wound #1 Right,Circumferential Lower Leg o ABD pad Dressing Change Frequency Wound #1 Right,Circumferential Lower Leg o Change Dressing Monday, Wednesday, Friday Follow-up Appointments Wound #1 Right,Circumferential Lower Leg o Return Appointment in 1 week. Edema Control Wound #1 Right,Circumferential Lower Leg o 3 Layer Compression System - Right Lower Extremity - Please wrap from base of toes to 3 finger widths below knee. o Elevate legs to the level of the heart and pump ankles as often as possible - Do not sit with legs dangling down, please elevate. Electronic Signature(s) Signed: 03/01/2017 3:48:14 PM By: Christin Fudge MD, FACS Anton Ruiz, Tamela Oddi ([id number]) Signed: 03/01/2017 4:54:08 PM By: Gretta Cool, BSN, RN, CWS, Kim RN, BSN Entered By: Gretta Cool, BSN, RN, CWS, Kim on 03/01/2017 14:29:09 Donna Rosario ([id number]) -------------------------------------------------------------------------------- Problem List Details Patient Name: Donna Rosario Date of Service: 03/01/2017 2:00 PM Medical Record Number: [id number] Patient Account Number: [id number] Date of Birth/Sex: August 16, 1922 (83 y.o. 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 Female) Treating RN: Cornell Barman Primary Care Provider: Viviana Simpler Other Clinician: Referring Provider: Viviana Simpler Treating Provider/Extender: Frann Rider in Treatment: 2 Subjective Chief Complaint Information obtained from Patient Patient presents to the wound care center for a consult due non healing wound to her  right lower extremity which she's had for about 6 weeks History of Present Illness (HPI) The following HPI elements were documented for the patient's wound: Location: swelling and ulceration on the right lower extremity Quality: Patient reports experiencing a dull pain to affected area(s). Severity: Patient states wound are getting worse. Duration: Patient has had the wound for < 8 weeks prior to presenting for treatment Timing: Pain in wound is Intermittent (comes and goes Context: The wound would happen gradually Modifying Factors: Other treatment(s) tried include:various types of wraps have been applied Associated Signs and Symptoms: Patient reports having increase swelling. 83 year old patient with a personal history of TIA, vascular dementia, delirium, atrial fibrillation, essential hypertension, hypothyroidism, nonrheumatic mitral valve disorder and  osteoporosis was sent to see [place] with weeping edema of the right lower extremity which has failed local care. She also has a past surgical history of right hip joint replacement and a left knee replacement in 2008. She is also had a thyroidectomy and a total hip arthroplasty. she has never been a smoker. Her PCP treated her with furosemide for her chronic diastolic CHF, Eliquis for her atrial fibrillation,and though the diagnosis of venous insufficiency was made I believe there are no pertinent workup available. Her power of attorney of family members not here at the bedside so we do not know details of how aggressive they want to be with therapy. 02/21/2017 -- the patient's daughter, who has the power of attorney, spoke to my clinical nurse manager who discussed the question whether we should proceed with venous duplex reflux studies and the daughter wanted [place] to go ahead and do the requisite tests. 03/01/2017 -- had a lower extremity venous reflux examination done which showed no evidence of DVT or SVT. There was incompetence of the right deep venous system, incompetence of the right great saphenous vein and incompetence of the right small saphenous vein. The left lower extremity study was not done With this result I would definitely recommend a vascular surgeon's opinion for possible endovenous ablation and the patient's power of attorney and I will have a conversation regarding this. Donna Rosario, Donna Rosario ([id number]) Objective Constitutional Pulse regular. Respirations normal and unlabored. Afebrile. Vitals Time Taken: 2:11 PM, Height: 63 in, Temperature: 98.0 F, Pulse: 58 bpm, Respiratory Rate: 16 breaths/min, Blood Pressure: 127/55 mmHg. Eyes Nonicteric. Reactive to light. Ears, Nose, Mouth, and Throat Lips, teeth, and gums WNL.Marland Kitchen Moist mucosa without lesions. Neck supple and nontender. No palpable supraclavicular or cervical adenopathy. Normal sized without goiter. Respiratory WNL. No  retractions.. Cardiovascular Pedal Pulses WNL. No clubbing, cyanosis or edema. Lymphatic No adneopathy. No adenopathy. No adenopathy. Musculoskeletal Adexa without tenderness or enlargement.. Digits and nails w/o clubbing, cyanosis, infection, petechiae, ischemia, or inflammatory conditions.Marland Kitchen Psychiatric Judgement and insight Intact.. No evidence of depression, anxiety, or agitation.. General Notes: the right lower extremity has significant decrease in the weeping and ulceration but the compression had slipped midway to the calf. No sharp debridement was required Integumentary (Hair, Skin) No suspicious lesions. No crepitus or fluctuance. No peri-wound warmth or erythema. No masses.. Wound #1 status is Open. Original cause of wound was Gradually Appeared. The wound is located on the [place], [place] ([id number]) Right,Circumferential Lower Leg. The wound measures 8cm length x 10cm width x 0.1cm depth; 62.832cm^2 area and 6.283cm^3 volume. The wound is limited to skin breakdown. There is no tunneling or undermining noted. There is a large amount of serous drainage noted. The wound margin is indistinct and nonvisible. There is large (67-100%) granulation within the  wound bed. There is no necrotic tissue within the wound bed. The periwound skin appearance exhibited: Excoriation, Maceration, Hemosiderin Staining. The periwound skin appearance did not exhibit: Callus, Crepitus, Induration, Rash, Scarring, Dry/Scaly, Atrophie Blanche, Cyanosis, Ecchymosis, Mottled, Pallor, Rubor, Erythema. 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 May shower with protection. - can be removed before nurse is scheduled to change dressing. Skin Barriers/Peri-Wound Care: Wound #1 Right,Circumferential Lower Leg: Barrier cream Primary Wound Dressing: Wound #1 Right,Circumferential Lower Leg: Aquacel Ag Secondary Dressing: Wound #1 Right,Circumferential Lower Leg: ABD pad Dressing Change Frequency: Wound #1 Right,Circumferential Lower Leg: Change Dressing Monday, Wednesday, Friday Follow-up Appointments: Wound #1 Right,Circumferential Lower Leg: Return Appointment in 1 week. Edema Control: Wound #1 Right,Circumferential Lower Leg: Tickner, Hilary ([id number]) 3 Layer Compression System - Right Lower Extremity - Please wrap from base of toes to 3 finger widths below knee. Elevate legs to the level of the heart and pump ankles as often as possible - Do not sit with legs dangling down, please elevate. After reviewing the venous duplex study I would recommend a vascular surgeon's opinion regarding the possibility of endovenous ablation. The patient's daughter, who has the power of attorney, will get in touch with me and I will discuss this matter with her In the meanwhile I have recommended: 1. silver alginate and a 3 layer Profore wrap 2. elevation and exercise has been discussed with her 3. venous duplex reflux studies to be ordered both lower extremities -- results discussed with the caregiver today 4. regular visits to the wound center Electronic Signature(s) Signed: 03/01/2017 3:48:14 PM By: Christin Fudge MD, FACS Entered By: Christin Fudge on 03/01/2017 14:35:00 Donna Rosario ([id number]) -------------------------------------------------------------------------------- SuperBill Details Patient Name: Donna Rosario Date of Service: 03/01/2017 Medical Record Number: [id number] Patient Account Number: [id number] Date of Birth/Sex: 14-Mar-1923 (83 y.o.  Female) Treating RN: Cornell Barman Primary Care Provider: Viviana Simpler Other Clinician: Referring Provider: Viviana Simpler Treating Provider/Extender: Frann Rider in Treatment: 2 Diagnosis Coding ICD-10 Codes Code Description I89.0 Lymphedema, not elsewhere classified L97.212 Non-pressure chronic ulcer of right calf with fat layer exposed F01.50 Vascular dementia without behavioral disturbance Facility Procedures CPT4: Description Modifier Quantity Code [id number] (Facility Use Only) [phone] - [facility] 1 LEG Physician Procedures CPT4 Code: [id number] Description: 35361 - WC PHYS LEVEL 3 - EST PT ICD-10 Description Diagnosis I89.0 Lymphedema, not elsewhere classified L97.212 Non-pressure chronic ulcer of right calf with fat F01.50 Vascular dementia without behavioral disturbance Modifier: layer exposed Quantity: 1 Electronic Signature(s) Signed: 03/01/2017 3:48:14 PM By: Christin Fudge MD, FACS Entered By: Christin Fudge on 03/01/2017 14:35:12

## 2017-03-04 NOTE — Progress Notes (Signed)
TEILA, SKALSKY ([id number]) Visit Report for 03/01/2017 Arrival Information Details Patient Name: Donna Rosario, Donna Rosario Date of Service: 03/01/2017 2:00 PM Medical Record Number: [id number] Patient Account Number: [id number] Date of Birth/Sex: [birth year] (83 y.o. Female) Treating RN: Cornell Barman Primary Care Stavroula Rohde: Viviana Simpler Other Clinician: Referring Syndey Jaskolski: Viviana Simpler Treating Jearlean Demauro/Extender: Frann Rider in Treatment: 2 Visit Information History Since Last Visit Added or deleted any medications: No Patient Arrived: Gilford Rile Any new allergies or adverse reactions: No Arrival Time: 14:10 Had a fall or experienced change in No Accompanied By: caregiver activities of daily living that may affect Transfer Assistance: None risk of falls: Patient Identification Verified: Yes Signs or symptoms of abuse/neglect since last No Secondary Verification Process Yes visito Completed: Hospitalized since last visit: No Patient Requires Transmission- No Has Dressing in Place as Prescribed: Yes Based Precautions: Has Compression in Place as Prescribed: Yes Patient Has Alerts: Yes Pain Present Now: No Patient Alerts: Patient on Blood Thinner Eliquis Not Diabetic Electronic Signature(s) Signed: 03/01/2017 4:54:08 PM By: Gretta Cool, BSN, RN, CWS, Kim RN, BSN Entered By: Gretta Cool, BSN, RN, CWS, Kim on 03/01/2017 14:11:13 Donna Rosario ([id number]) -------------------------------------------------------------------------------- Encounter Discharge Information Details Patient Name: Donna Rosario Date of Service: 03/01/2017 2:00 PM Medical Record Number: [id number] Patient Account Number: [id number] Date of Birth/Sex: 06-Jul-1922 (83 y.o. Female) Treating RN: Cornell Barman Primary Care Aldina Porta: Viviana Simpler Other Clinician: Referring Laverta Harnisch: Viviana Simpler Treating Royden Bulman/Extender: Frann Rider in Treatment: 2 Encounter Discharge Information Items Discharge Pain Level:  0 Discharge Condition: Stable Ambulatory Status: Walker Discharge Destination: Home Transportation: Private Auto Accompanied By: caregiver Schedule Follow-up Appointment: Yes Medication Reconciliation completed Yes and provided to Patient/Care Antwan Pandya: Provided on Clinical Summary of Care: 03/01/2017 Form Type Recipient Paper Patient DD Electronic Signature(s) Signed: 03/04/2017 10:12:15 AM By: Ruthine Dose Entered By: Ruthine Dose on 03/01/2017 14:39:54 Donna Rosario ([id number]) -------------------------------------------------------------------------------- Lower Extremity Assessment Details Patient Name: Donna Rosario Date of Service: 03/01/2017 2:00 PM Medical Record Number: [id number] Patient Account Number: [id number] Date of Birth/Sex: 1923/05/03 (83 y.o. Female) Treating RN: Cornell Barman Primary Care Kasen Sako: Viviana Simpler Other Clinician: Referring Laronda Lisby: Viviana Simpler Treating Breton Berns/Extender: Frann Rider in Treatment: 2 Vascular Assessment Pulses: Dorsalis Pedis Palpable: [Right:Yes] Posterior Tibial Palpable: [Right:Yes] Extremity colors, hair growth, and conditions: Extremity Color: [Right:Red] Hair Growth on Extremity: [Right:No] Temperature of Extremity: [Right:Warm] Capillary Refill: [Right:< 3 seconds] Dependent Rubor: [Right:No] Blanched when Elevated: [Right:No] Lipodermatosclerosis: [Right:No] Toe Nail Assessment Left: Right: Thick: Yes Discolored: Yes Deformed: Yes Improper Length and Hygiene: Yes Electronic Signature(s) Signed: 03/01/2017 4:54:08 PM By: Gretta Cool, BSN, RN, CWS, Kim RN, BSN Entered By: Gretta Cool, BSN, RN, CWS, Kim on 03/01/2017 14:19:38 Donna Rosario ([id number]) -------------------------------------------------------------------------------- Multi Wound Chart Details Patient Name: Donna Rosario Date of Service: 03/01/2017 2:00 PM Medical Record Number: [id number] Patient Account Number: [id number] Date of Birth/Sex:  [birth year] (83 y.o. Female) Treating RN: Cornell Barman Primary Care Kandon Hosking: Viviana Simpler Other Clinician: Referring Berish Bohman: Viviana Simpler Treating Avayah Raffety/Extender: Frann Rider in Treatment: 2 Vital Signs Height(in): 63 Pulse(bpm): 58 Weight(lbs): Blood Pressure 127/55 (mmHg): Body Mass Index(BMI): Temperature(F): 98.0 Respiratory Rate 16 (breaths/min): Photos: [N/A:N/A] Wound Location: Left Lower Leg - N/A N/A Circumfernential Wounding Event: Gradually Appeared N/A N/A Primary Etiology: Lymphedema N/A N/A Comorbid History: Arrhythmia, Hypertension, N/A N/A Osteoarthritis, Dementia Date Acquired: 01/14/2017 N/A N/A Weeks of Treatment: 2 N/A N/A Wound Status: Open N/A N/A Clustered Wound: Yes N/A N/A Clustered Quantity: 7 N/A N/A Measurements L x W x D 8x10x0.1 N/A N/A (cm) Area (cm) : 29.518 N/A  N/A Volume (cm) : 6.283 N/A N/A % Reduction in Area: 83.20% N/A N/A % Reduction in Volume: 83.20% N/A N/A Classification: Full Thickness Without N/A N/A Exposed Support Structures Exudate Amount: Large N/A N/A Exudate Type: Serous N/A N/A Exudate Color: amber N/A N/A Wound Margin: Indistinct, nonvisible N/A N/A Donna Rosario, Donna Rosario ([id number]) Granulation Amount: Large (67-100%) N/A N/A Necrotic Amount: None Present (0%) N/A N/A Exposed Structures: Fascia: No N/A N/A Fat Layer (Subcutaneous Tissue) Exposed: No Tendon: No Muscle: No Joint: No Bone: No Limited to Skin Breakdown Epithelialization: Large (67-100%) N/A N/A Periwound Skin Texture: Excoriation: Yes N/A N/A Induration: No Callus: No Crepitus: No Rash: No Scarring: No Periwound Skin Maceration: Yes N/A N/A Moisture: Dry/Scaly: No Periwound Skin Color: Hemosiderin Staining: Yes N/A N/A Atrophie Blanche: No Cyanosis: No Ecchymosis: No Erythema: No Mottled: No Pallor: No Rubor: No Tenderness on No N/A N/A Palpation: Wound Preparation: Ulcer Cleansing: N/A N/A Rinsed/Irrigated  with Saline Topical Anesthetic Applied: None Treatment Notes Wound #1 (Right, Circumferential Lower Leg) 1. Cleansed with: Clean wound with Normal Saline 3. Peri-wound Care: Barrier cream 4. Dressing Applied: Aquacel Ag 5. Secondary Dressing Applied ABD Pad 7. Secured with 3 Layer Compression System - Right Lower Extremity Donna Rosario, Donna Rosario ([id number]) Electronic Signature(s) Signed: 03/01/2017 3:48:14 PM By: Christin Fudge MD, FACS Entered By: Christin Fudge on 03/01/2017 14:31:35 Donna Rosario ([id number]) -------------------------------------------------------------------------------- Multi-Disciplinary Care Plan Details Patient Name: Donna Rosario Date of Service: 03/01/2017 2:00 PM Medical Record Number: [id number] Patient Account Number: [id number] Date of Birth/Sex: 1923-03-05 (83 y.o. Female) Treating RN: Cornell Barman Primary Care [place] Current: Viviana Simpler Other Clinician: Referring Jayli Fogleman: Viviana Simpler Treating Shawndell Varas/Extender: Frann Rider in Treatment: 2 Active Inactive ` Orientation to the Wound Care Program Nursing Diagnoses: Knowledge deficit related to the wound healing center program Goals: Patient/caregiver will verbalize understanding of the [place] Program Date Initiated: 02/14/2017 Target Resolution Date: 02/25/2017 Goal Status: Active Interventions: Provide education on orientation to the wound center Notes: ` Wound/Skin Impairment Nursing Diagnoses: Knowledge deficit related to ulceration/compromised skin integrity Goals: Ulcer/skin breakdown will heal within 14 weeks Date Initiated: 02/14/2017 Target Resolution Date: 02/25/2017 Goal Status: Active Interventions: Assess ulceration(s) every visit Treatment Activities: Skin care regimen initiated : 02/14/2017 Topical wound management initiated : 02/14/2017 Notes: Electronic Signature(s) Donna Rosario, Donna Rosario ([id number]) Signed: 03/01/2017 4:54:08 PM By: Gretta Cool, BSN, RN, CWS, Kim RN,  BSN Entered By: Gretta Cool, BSN, RN, CWS, Kim on 03/01/2017 14:20:05 Donna Rosario ([id number]) -------------------------------------------------------------------------------- Pain Assessment Details Patient Name: Donna Rosario Date of Service: 03/01/2017 2:00 PM Medical Record Number: [id number] Patient Account Number: [id number] Date of Birth/Sex: 25-Nov-1922 (83 y.o. Female) Treating RN: Cornell Barman Primary Care Jacon Whetzel: Viviana Simpler Other Clinician: Referring Miki Labuda: Viviana Simpler Treating Rahima Fleishman/Extender: Frann Rider in Treatment: 2 Active Problems Location of Pain Severity and Description of Pain Patient Has Paino No Site Locations With Dressing Change: No Pain Management and Medication Current Pain Management: Goals for Pain Management Topical or injectable lidocaine is offered to patient for acute pain when surgical debridement is performed. If needed, Patient is instructed to use over the counter pain medication for the following 24-48 hours after debridement. Wound care MDs do not prescribed pain medications. Patient has chronic pain or uncontrolled pain. Patient has been instructed to make an appointment with their Primary Care Physician for pain management. Electronic Signature(s) Signed: 03/01/2017 4:54:08 PM By: Gretta Cool, BSN, RN, CWS, Kim RN, BSN Entered By: Gretta Cool, BSN, RN, CWS, Kim on 03/01/2017 14:11:49 Donna Rosario ([id number]) -------------------------------------------------------------------------------- Patient/Caregiver Education Details Patient Name: Donna Rosario,  Donna Rosario Date of Service: 03/01/2017 2:00 PM Medical Record Number: [id number] Patient Account Number: [id number] Date of Birth/Gender: [birth year] (83 y.o. Female) Treating RN: Cornell Barman Primary Care Physician: Viviana Simpler Other Clinician: Referring Physician: Viviana Simpler Treating Physician/Extender: Frann Rider in Treatment: 2 Education Assessment Education Provided  To: Patient Education Topics Provided Venous: Handouts: Controlling Swelling with Compression Stockings Methods: Demonstration Responses: State content correctly Electronic Signature(s) Signed: 03/01/2017 4:54:08 PM By: Gretta Cool, BSN, RN, CWS, Kim RN, BSN Entered By: Gretta Cool, BSN, RN, CWS, Kim on 03/01/2017 14:30:51 Donna Rosario ([id number]) -------------------------------------------------------------------------------- Wound Assessment Details Patient Name: Donna Rosario Date of Service: 03/01/2017 2:00 PM Medical Record Number: [id number] Patient Account Number: [id number] Date of Birth/Sex: Jun 29, 1922 (83 y.o. Female) Treating RN: Cornell Barman Primary Care Janica Eldred: Viviana Simpler Other Clinician: Referring Nayah Lukens: Viviana Simpler Treating Phil Michels/Extender: Frann Rider in Treatment: 2 Wound Status Wound Number: 1 Primary Lymphedema Etiology: Wound Location: Left Lower Leg - Circumfernential Wound Status: Open Wounding Event: Gradually Appeared Comorbid Arrhythmia, Hypertension, History: Osteoarthritis, Dementia Date Acquired: 01/14/2017 Weeks Of Treatment: 2 Clustered Wound: Yes Photos Wound Measurements Length: (cm) 8 Width: (cm) 10 Depth: (cm) 0.1 Clustered Quantity: 7 Area: (cm) 62.832 Volume: (cm) 6.283 % Reduction in Area: 83.2% % Reduction in Volume: 83.2% Epithelialization: Large (67-100%) Tunneling: No Undermining: No Wound Description Full Thickness Without Exposed Classification: Support Structures Wound Margin: Indistinct, nonvisible Exudate Large Amount: Exudate Type: Serous Exudate Color: amber Foul Odor After Cleansing: No Slough/Fibrino No Wound Bed Granulation Amount: Large (67-100%) Exposed Structure Necrotic Amount: None Present (0%) Fascia Exposed: No Fat Layer (Subcutaneous Tissue) Exposed: No Tendon Exposed: No Donna Rosario, Donna Rosario ([id number]) Muscle Exposed: No Joint Exposed: No Bone Exposed: No Limited to Skin  Breakdown Periwound Skin Texture Texture Color No Abnormalities Noted: No No Abnormalities Noted: No Callus: No Atrophie Blanche: No Crepitus: No Cyanosis: No Excoriation: Yes Ecchymosis: No Induration: No Erythema: No Rash: No Hemosiderin Staining: Yes Scarring: No Mottled: No Pallor: No Moisture Rubor: No No Abnormalities Noted: No Dry / Scaly: No Maceration: Yes Wound Preparation Ulcer Cleansing: Rinsed/Irrigated with Saline Topical Anesthetic Applied: None Electronic Signature(s) Signed: 03/01/2017 4:54:08 PM By: Gretta Cool, BSN, RN, CWS, Kim RN, BSN Entered By: Gretta Cool, BSN, RN, CWS, Kim on 03/01/2017 14:18:45 Donna Rosario ([id number]) -------------------------------------------------------------------------------- [place] Details Patient Name: Donna Rosario Date of Service: 03/01/2017 2:00 PM Medical Record Number: [id number] Patient Account Number: [id number] Date of Birth/Sex: 20-Oct-1922 (83 y.o. Female) Treating RN: Cornell Barman Primary Care Beverley Sherrard: Viviana Simpler Other Clinician: Referring Tyrah Broers: Viviana Simpler Treating Brazen Domangue/Extender: Frann Rider in Treatment: 2 Vital Signs Time Taken: 14:11 Temperature (F): 98.0 Height (in): 63 Pulse (bpm): 58 Respiratory Rate (breaths/min): 16 Blood Pressure (mmHg): 127/55 Reference Range: 80 - 120 mg / dl Electronic Signature(s) Signed: 03/01/2017 4:54:08 PM By: Gretta Cool, BSN, RN, CWS, Kim RN, BSN Entered By: Gretta Cool, BSN, RN, CWS, Kim on 03/01/2017 14:12:09

## 2017-03-07 ENCOUNTER — Encounter: Payer: Medicare Other | Admitting: Surgery

## 2017-03-07 DIAGNOSIS — I890 Lymphedema, not elsewhere classified: Secondary | ICD-10-CM | POA: Diagnosis not present

## 2017-03-09 NOTE — Progress Notes (Addendum)
PRUE, LINGENFELTER ([id number]) Visit Report for 03/07/2017 Chief Complaint Document Details Patient Name: Donna Rosario, Donna Rosario Date of Service: 03/07/2017 3:15 PM Medical Record Number: [id number] Patient Account Number: [id number] Date of Birth/Sex: 1923-03-22 (83 y.o. Female) Treating RN: Montey Hora Primary Care Provider: Viviana Simpler Other Clinician: Referring Provider: Viviana Simpler Treating Provider/Extender: Frann Rider in Treatment: 3 Information Obtained from: Patient Chief Complaint Patient presents to the wound care center for a consult due non healing wound to her right lower extremity which she's had for about 6 weeks Electronic Signature(s) Signed: 03/07/2017 5:02:50 PM By: Christin Fudge MD, FACS Entered By: Christin Fudge on 03/07/2017 16:05:51 Donna Rosario ([id number]) -------------------------------------------------------------------------------- HPI Details Patient Name: Donna Rosario Date of Service: 03/07/2017 3:15 PM Medical Record Number: [id number] Patient Account Number: [id number] Date of Birth/Sex: 20-Apr-1923 (83 y.o. Female) Treating RN: Montey Hora Primary Care Provider: Viviana Simpler Other Clinician: Referring Provider: Viviana Simpler Treating Provider/Extender: Frann Rider in Treatment: 3 History of Present Illness Location: swelling and ulceration on the right lower extremity Quality: Patient reports experiencing a dull pain to affected area(s). Severity: Patient states wound are getting worse. Duration: Patient has had the wound for < 8 weeks prior to presenting for treatment Timing: Pain in wound is Intermittent (comes and goes Context: The wound would happen gradually Modifying Factors: Other treatment(s) tried include:various types of wraps have been applied Associated Signs and Symptoms: Patient reports having increase swelling. HPI Description: 83 year old patient with a personal history of TIA, vascular dementia, delirium,  atrial fibrillation, essential hypertension, hypothyroidism, nonrheumatic mitral valve disorder and osteoporosis was sent to see [place] with weeping edema of the right lower extremity which has failed local care. She also has a past surgical history of right hip joint replacement and a left knee replacement in 2008. She is also had a thyroidectomy and a total hip arthroplasty. she has never been a smoker. Her PCP treated her with furosemide for her chronic diastolic CHF, Eliquis for her atrial fibrillation,and though the diagnosis of venous insufficiency was made I believe there are no pertinent workup available. Her power of attorney of family members not here at the bedside so we do not know details of how aggressive they want to be with therapy. 02/21/2017 -- the patient's daughter, who has the power of attorney, spoke to my clinical nurse manager who discussed the question whether we should proceed with venous duplex reflux studies and the daughter wanted [place] to go ahead and do the requisite tests. 03/01/2017 -- had a lower extremity venous reflux examination done which showed no evidence of DVT or SVT. There was incompetence of the right deep venous system, incompetence of the right great saphenous vein and incompetence of the right small saphenous vein. The left lower extremity study was not done With this result I would definitely recommend a vascular surgeon's opinion for possible endovenous ablation and the patient's power of attorney and I will have a conversation regarding this. 03/07/2017 -- she is awaiting a vascular surgeon's opinion which is scheduled for September 27 Electronic Signature(s) Signed: 03/07/2017 5:02:50 PM By: Christin Fudge MD, FACS Entered By: Christin Fudge on 03/07/2017 [place], Roland ([id number]) -------------------------------------------------------------------------------- Physical Exam Details Patient Name: Donna Rosario Date of Service: 03/07/2017 3:15  PM Medical Record Number: [id number] Patient Account Number: [id number] Date of Birth/Sex: 20-Feb-1923 (83 y.o. Female) Treating RN: Montey Hora Primary Care Provider: Viviana Simpler Other Clinician: Referring Provider: Viviana Simpler Treating Provider/Extender: Frann Rider in Treatment: 3 Constitutional . Pulse regular. Respirations normal and  unlabored. Afebrile. . Eyes Nonicteric. Reactive to light. Ears, Nose, Mouth, and Throat Lips, teeth, and gums WNL.Marland Kitchen Moist mucosa without lesions. Neck supple and nontender. No palpable supraclavicular or cervical adenopathy. Normal sized without goiter. Respiratory WNL. No retractions.. Cardiovascular Pedal Pulses WNL. No clubbing, cyanosis or edema. Lymphatic No adneopathy. No adenopathy. No adenopathy. Musculoskeletal Adexa without tenderness or enlargement.. Digits and nails w/o clubbing, cyanosis, infection, petechiae, ischemia, or inflammatory conditions.. Integumentary (Hair, Skin) No suspicious lesions. No crepitus or fluctuance. No peri-wound warmth or erythema. No masses.Marland Kitchen Psychiatric Judgement and insight Intact.. No evidence of depression, anxiety, or agitation.. Notes overall there is significant improvement in the right lower extremity weeping and ulceration and the lymphedema is much better. Electronic Signature(s) Signed: 03/07/2017 5:02:50 PM By: Christin Fudge MD, FACS Entered By: Christin Fudge on 03/07/2017 16:06:41 Donna Rosario ([id number]) -------------------------------------------------------------------------------- Physician Orders Details Patient Name: Donna Rosario Date of Service: 03/07/2017 3:15 PM Medical Record Number: [id number] Patient Account Number: [id number] Date of Birth/Sex: Jun 20, 1923 (83 y.o. Female) Treating RN: Montey Hora Primary Care Provider: Viviana Simpler Other Clinician: Referring Provider: Viviana Simpler Treating Provider/Extender: Frann Rider in Treatment:  3 Verbal / Phone Orders: No Diagnosis Coding Wound Cleansing Wound #1 Right,Circumferential Lower Leg o May Shower, gently pat wound dry prior to applying new dressing. o May shower with protection. - can be removed before nurse is scheduled to change dressing. Skin Barriers/Peri-Wound Care Wound #1 Right,Circumferential Lower Leg o Barrier cream Primary Wound Dressing Wound #1 Right,Circumferential Lower Leg o Aquacel Ag Secondary Dressing Wound #1 Right,Circumferential Lower Leg o ABD pad Dressing Change Frequency Wound #1 Right,Circumferential Lower Leg o Change Dressing Monday, Wednesday, Friday Follow-up Appointments Wound #1 Right,Circumferential Lower Leg o Return Appointment in 1 week. Edema Control Wound #1 Right,Circumferential Lower Leg o 3 Layer Compression System - Right Lower Extremity - Please wrap from base of toes to 3 finger widths below knee. YouTube has many videos on how to apply this wrap appropriately o Elevate legs to the level of the heart and pump ankles as often as possible - Do not sit with legs dangling down, please elevate. Electronic Signature(s) Signed: 03/07/2017 5:02:50 PM By: Christin Fudge MD, FACS Campbell, Tamela Oddi ([id number]) Signed: 03/07/2017 5:58:36 PM By: Montey Hora Entered By: Montey Hora on 03/07/2017 15:45:23 Donna Rosario, Donna Rosario ([id number]) -------------------------------------------------------------------------------- Problem List Details Patient Name: Donna Rosario Date of Service: 03/07/2017 3:15 PM Medical Record Number: [id number] Patient Account Number: [id number] Date of Birth/Sex: [birth year] (83 y.o. 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 Female) Treating RN: Montey Hora Primary Care Provider: Viviana Simpler Other Clinician: Referring Provider: Viviana Simpler Treating Provider/Extender: Frann Rider in Treatment: 3 Subjective Chief Complaint Information obtained from Patient Patient presents to the wound  care center for a consult due non healing wound to her right lower extremity which she's had for about 6 weeks History of Present Illness (HPI) The following HPI elements were documented for the patient's wound: Location: swelling and ulceration on the right lower extremity Quality: Patient reports experiencing a dull pain to affected area(s). Severity: Patient states wound are getting worse. Duration: Patient has had the wound for < 8 weeks prior to presenting for treatment Timing: Pain in wound is Intermittent (comes and goes Context: The wound would happen gradually Modifying Factors: Other treatment(s) tried include:various types of wraps have been applied Associated Signs and Symptoms: Patient reports having increase swelling. 83 year old patient with a personal history of TIA, vascular dementia, delirium, atrial  fibrillation, essential hypertension, hypothyroidism, nonrheumatic mitral valve disorder and osteoporosis was sent to see [place] with weeping edema of the right lower extremity which has failed local care. She also has a past surgical history of right hip joint replacement and a left knee replacement in 2008. She is also had a thyroidectomy and a total hip arthroplasty. she has never been a smoker. Her PCP treated her with furosemide for her chronic diastolic CHF, Eliquis for her atrial fibrillation,and though the diagnosis of venous insufficiency was made I believe there are no pertinent workup available. Her power of attorney of family members not here at the bedside so we do not know details of how aggressive they want to be with therapy. 02/21/2017 -- the patient's daughter, who has the power of attorney, spoke to my clinical nurse manager who discussed the question whether we should proceed with venous duplex reflux studies and the daughter wanted [place] to go ahead and do the requisite tests. 03/01/2017 -- had a lower extremity venous reflux examination done which showed no evidence of DVT or SVT. There was incompetence of the right deep venous system, incompetence of the right great saphenous vein and incompetence of the right small saphenous vein. The left lower extremity study was not done With this result I would definitely recommend a vascular surgeon's opinion for possible endovenous ablation and the patient's power of attorney and I will have a conversation regarding this. Donna Rosario, Donna Rosario ([id number]) 03/07/2017 -- she is awaiting a vascular surgeon's opinion which is scheduled for September 27 Objective Constitutional Pulse regular. Respirations normal and unlabored. Afebrile. Vitals Time Taken: 3:34 PM, Height: 63 in, Temperature: 97.7 F, Pulse: 62 bpm, Respiratory Rate: 18 breaths/min, Blood Pressure: 121/93 mmHg. Eyes Nonicteric. Reactive to light. Ears, Nose, Mouth, and  Throat Lips, teeth, and gums WNL.Marland Kitchen Moist mucosa without lesions. Neck supple and nontender. No palpable supraclavicular or cervical adenopathy. Normal sized without goiter. Respiratory WNL. No retractions.. Cardiovascular Pedal Pulses WNL. No clubbing, cyanosis or edema. Lymphatic No adneopathy. No adenopathy. No adenopathy. Musculoskeletal Adexa without tenderness or enlargement.. Digits and nails w/o clubbing, cyanosis, infection, petechiae, ischemia, or inflammatory conditions.Marland Kitchen Psychiatric Judgement and insight Intact.. No evidence of depression, anxiety, or agitation.. General Notes: overall there is significant improvement in the right lower extremity weeping and ulceration and the lymphedema is much better. Integumentary (Hair, Skin) No suspicious lesions. No crepitus or fluctuance. No peri-wound warmth or erythema. No masses.Constance Goltz, Tamela Oddi ([id number]) Wound #1 status is Open. Original cause of wound was Gradually Appeared. The wound is located on the Right,Circumferential Lower Leg. The wound measures 5.5cm length x 8cm width x 0.1cm depth; 34.558cm^2 area and 3.456cm^3 volume. The wound is limited to skin breakdown. There is no tunneling or undermining noted. There is a large  amount of serous drainage noted. The wound margin is indistinct and nonvisible. There is large (67-100%) granulation within the wound bed. There is no necrotic tissue within the wound bed. The periwound skin appearance exhibited: Excoriation, Maceration, Hemosiderin Staining. The periwound skin appearance did not exhibit: Callus, Crepitus, Induration, Rash, Scarring, Dry/Scaly, Atrophie Blanche, Cyanosis, Ecchymosis, Mottled, Pallor, Rubor, Erythema. 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 There was a Three Layer Compression Therapy Procedure with a pre-treatment ABI of 1.1 by Montey Hora, RN. Post procedure Diagnosis Wound #1: Same as Pre-Procedure Plan Wound Cleansing: Wound #1 Right,Circumferential Lower Leg: May Shower, gently pat wound dry prior to applying new dressing. May shower with protection. - can be removed before nurse is scheduled to change dressing. Skin Barriers/Peri-Wound Care: Wound #1 Right,Circumferential Lower Leg: Wolff, Regina ([id number]) Barrier cream Primary Wound Dressing: Wound #1 Right,Circumferential Lower Leg: Aquacel Ag Secondary Dressing: Wound #1 Right,Circumferential Lower Leg: ABD pad Dressing Change Frequency: Wound #1 Right,Circumferential Lower Leg: Change Dressing Monday, Wednesday, Friday Follow-up Appointments: Wound #1 Right,Circumferential Lower Leg: Return Appointment in 1 week. Edema Control: Wound #1 Right,Circumferential Lower Leg: 3 Layer Compression System - Right Lower Extremity - Please wrap from base of toes to 3 finger widths below knee. YouTube has many videos on how to apply this wrap appropriately Elevate legs to the level of the heart and pump ankles as often as possible - Do not sit with legs dangling down, please elevate. the vascular surgeons opinion is elevated towards the end of this month and in the meanwhile I have recommended: 1. silver alginate and a 3 layer Profore wrap 2. elevation and exercise has been discussed with her 3. regular visits to the wound center Electronic Signature(s) Signed: 03/15/2017 4:10:18 PM By: Christin Fudge MD, FACS Previous Signature: 03/07/2017 5:02:50 PM Version By: Christin Fudge MD, FACS Entered By: Christin Fudge on 03/11/2017 15:45:31 Donna Rosario ([id number]) -------------------------------------------------------------------------------- SuperBill Details Patient Name: Donna Rosario Date of Service: 03/07/2017 Medical Record Number: [id number] Patient Account Number: [id number] Date of Birth/Sex: 1923-05-17 (83 y.o. 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 Facility Procedures CPT4: Description Modifier Quantity Code [id number] (Facility Use Only) [phone] - [facility] LWR LT 1 LEG Physician Procedures CPT4 Code: [id number] Description: 98921 - WC PHYS LEVEL 3 - EST PT ICD-10 Description Diagnosis I89.0 Lymphedema, not elsewhere classified L97.212 Non-pressure chronic ulcer of right calf with fat I83.013 Varicose veins of right lower extremity with ulce I83.012 Varicose  veins of right lower extremity with ulce Modifier: layer exposed r of ankle r of calf Quantity: 1 Electronic Signature(s) Signed: 03/07/2017 5:58:36 PM By: Montey Hora Signed: 03/11/2017 1:02:03 PM By: Christin Fudge MD, FACS Previous Signature: 03/07/2017 5:02:50 PM Version By: Christin Fudge MD, FACS  Entered By: Montey Hora on 03/07/2017 17:28:05

## 2017-03-11 NOTE — Progress Notes (Signed)
KATIANNA, MCCLENNEY ([id number]) Visit Report for 03/07/2017 Arrival Information Details Patient Name: Donna Rosario, Donna Rosario Date of Service: 03/07/2017 3:15 PM Medical Record Number: [id number] Patient Account Number: [id number] Date of Birth/Sex: [birth year] (83 y.o. Female) Treating RN: Montey Hora Primary Care Malaika Arnall: Viviana Simpler Other Clinician: Referring Sukhmani Fetherolf: Viviana Simpler Treating Azalya Galyon/Extender: Frann Rider in Treatment: 3 Visit Information History Since Last Visit Added or deleted any medications: No Patient Arrived: Walker Any new allergies or adverse reactions: No Arrival Time: 15:24 Had a fall or experienced change in No Accompanied By: caregiver activities of daily living that may affect Transfer Assistance: None risk of falls: Patient Identification Verified: Yes Signs or symptoms of abuse/neglect since last No Secondary Verification Process Yes visito Completed: Hospitalized since last visit: No Patient Requires Transmission- No Has Dressing in Place as Prescribed: Yes Based Precautions: Pain Present Now: No Patient Has Alerts: Yes Patient Alerts: Patient on Blood Thinner Eliquis Not Diabetic Electronic Signature(s) Signed: 03/07/2017 5:58:36 PM By: Montey Hora Entered By: Montey Hora on 03/07/2017 15:30:13 Donna Rosario ([id number]) -------------------------------------------------------------------------------- Compression Therapy Details Patient Name: Donna Rosario Date of Service: 03/07/2017 3:15 PM Medical Record Number: [id number] Patient Account Number: [id number] Date of Birth/Sex: 1923/05/01 (83 y.o. Female) Treating RN: Montey Hora Primary Care Prateek Knipple: Viviana Simpler Other Clinician: Referring Thamara Leger: Viviana Simpler Treating Bhargav Barbaro/Extender: Frann Rider in Treatment: 3 Compression Therapy Performed for Wound Wound #1 Right,Circumferential Lower Leg Assessment: Performed By: Clinician Montey Hora,  RN Compression Type: Three Layer Pre Treatment ABI: 1.1 Post Procedure Diagnosis Same as Pre-procedure Electronic Signature(s) Signed: 03/07/2017 5:58:36 PM By: Montey Hora Entered By: Montey Hora on 03/07/2017 17:29:03 Donna Rosario ([id number]) -------------------------------------------------------------------------------- Encounter Discharge Information Details Patient Name: Donna Rosario Date of Service: 03/07/2017 3:15 PM Medical Record Number: [id number] Patient Account Number: [id number] Date of Birth/Sex: 1923/05/24 (83 y.o. Female) Treating RN: Montey Hora Primary Care Toriann Spadoni: Viviana Simpler Other Clinician: Referring Delan Ksiazek: Viviana Simpler Treating Makylee Sanborn/Extender: Frann Rider in Treatment: 3 Encounter Discharge Information Items Discharge Pain Level: 0 Discharge Condition: Stable Ambulatory Status: Walker Discharge Destination: Home Private Transportation: Auto Accompanied By: staff Schedule Follow-up Appointment: Yes Medication Reconciliation completed and No provided to Patient/Care Ellawyn Wogan: Patient Clinical Summary of Care: Declined Electronic Signature(s) Signed: 03/11/2017 10:13:53 AM By: Ruthine Dose Entered By: Ruthine Dose on 03/07/2017 16:05:32 Donna Rosario ([id number]) -------------------------------------------------------------------------------- Lower Extremity Assessment Details Patient Name: Donna Rosario Date of Service: 03/07/2017 3:15 PM Medical Record Number: [id number] Patient Account Number: [id number] Date of Birth/Sex: Feb 15, 1923 (83 y.o. Female) Treating RN: Montey Hora Primary Care Chadrick Sprinkle: Viviana Simpler Other Clinician: Referring Elianny Buxbaum: Viviana Simpler Treating Brionna Romanek/Extender: Frann Rider in Treatment: 3 Vascular Assessment Pulses: Dorsalis Pedis Palpable: [Right:Yes] Posterior Tibial Extremity colors, hair growth, and conditions: Extremity Color: [Right:Hyperpigmented] Hair Growth  on Extremity: [Right:No] Temperature of Extremity: [Right:Warm] Capillary Refill: [Right:< 3 seconds] Electronic Signature(s) Signed: 03/07/2017 5:58:36 PM By: Montey Hora Entered By: Montey Hora on 03/07/2017 15:35:28 Donna Rosario ([id number]) -------------------------------------------------------------------------------- Multi Wound Chart Details Patient Name: Donna Rosario Date of Service: 03/07/2017 3:15 PM Medical Record Number: [id number] Patient Account Number: [id number] Date of Birth/Sex: 1923/04/27 (83 y.o. Female) Treating RN: Montey Hora Primary Care Damyah Gugel: Viviana Simpler Other Clinician: Referring Paysen Goza: Viviana Simpler Treating Gwendolyn Mclees/Extender: Frann Rider in Treatment: 3 Vital Signs Height(in): 63 Pulse(bpm): 62 Weight(lbs): Blood Pressure 121/93 (mmHg): Body Mass Index(BMI): Temperature(F): 97.7 Respiratory Rate 18 (breaths/min): Photos: [1:No Photos] [N/A:N/A] Wound Location: [1:Right Lower Leg - Circumfernential] [N/A:N/A] Wounding Event: [1:Gradually Appeared] [N/A:N/A] Primary Etiology: [1:Lymphedema] [N/A:N/A] Comorbid History: [1:Arrhythmia, Hypertension, N/A  Osteoarthritis, Dementia] Date Acquired: [1:01/14/2017] [N/A:N/A] Weeks of Treatment: [1:3] [N/A:N/A] Wound Status: [1:Open] [N/A:N/A] Clustered Wound: [1:Yes] [N/A:N/A] Clustered Quantity: [1:7] [N/A:N/A] Measurements L x W x D 5.5x8x0.1 [N/A:N/A] (cm) Area (cm) : [1:34.558] [N/A:N/A] Volume (cm) : [1:3.456] [N/A:N/A] % Reduction in Area: [1:90.80%] [N/A:N/A] % Reduction in Volume: 90.80% [N/A:N/A] Classification: [1:Full Thickness Without Exposed Support Structures] [N/A:N/A] Exudate Amount: [1:Large] [N/A:N/A] Exudate Type: [1:Serous] [N/A:N/A] Exudate Color: [1:amber] [N/A:N/A] Wound Margin: [1:Indistinct, nonvisible] [N/A:N/A] Granulation Amount: [1:Large (67-100%)] [N/A:N/A] Necrotic Amount: [1:None Present (0%)] [N/A:N/A] Exposed Structures: [1:Fascia:  No Fat Layer (Subcutaneous Tissue) Exposed: No] [N/A:N/A] Tendon: No Muscle: No Joint: No Bone: No Limited to Skin Breakdown Epithelialization: Large (67-100%) N/A N/A Periwound Skin Texture: Excoriation: Yes N/A N/A Induration: No Callus: No Crepitus: No Rash: No Scarring: No Periwound Skin Maceration: Yes N/A N/A Moisture: Dry/Scaly: No Periwound Skin Color: Hemosiderin Staining: Yes N/A N/A Atrophie Blanche: No Cyanosis: No Ecchymosis: No Erythema: No Mottled: No Pallor: No Rubor: No Tenderness on No N/A N/A Palpation: Wound Preparation: Ulcer Cleansing: N/A N/A Rinsed/Irrigated with Saline, Other: soap and water Topical Anesthetic Applied: None Treatment Notes Electronic Signature(s) Signed: 03/07/2017 5:02:50 PM By: Christin Fudge MD, FACS Entered By: Christin Fudge on 03/07/2017 16:05:44 Donna Rosario ([id number]) -------------------------------------------------------------------------------- [place] Details Patient Name: Donna Rosario Date of Service: 03/07/2017 3:15 PM Medical Record Number: [id number] Patient Account Number: [id number] Date of Birth/Sex: 09/07/1922 (83 y.o. Female) Treating RN: Montey Hora Primary Care Amarie Tarte: Viviana Simpler Other Clinician: Referring Tamario Heal: Viviana Simpler Treating Derrika Ruffalo/Extender: Frann Rider in Treatment: 3 Active Inactive ` Orientation to the Wound Care Program Nursing Diagnoses: Knowledge deficit related to the wound healing center program Goals: Patient/caregiver will verbalize understanding of the [place] Program Date Initiated: 02/14/2017 Target Resolution Date: 02/25/2017 Goal Status: Active Interventions: Provide education on orientation to the wound center Notes: ` Wound/Skin Impairment Nursing Diagnoses: Knowledge deficit related to ulceration/compromised skin integrity Goals: Ulcer/skin breakdown will heal within 14 weeks Date Initiated:  02/14/2017 Target Resolution Date: 02/25/2017 Goal Status: Active Interventions: Assess ulceration(s) every visit Treatment Activities: Skin care regimen initiated : 02/14/2017 Topical wound management initiated : 02/14/2017 Notes: Electronic Signature(s) CESIAH, WESTLEY ([id number]) Signed: 03/07/2017 5:58:36 PM By: Montey Hora Entered By: Montey Hora on 03/07/2017 15:39:35 Donna Rosario ([id number]) -------------------------------------------------------------------------------- Pain Assessment Details Patient Name: Donna Rosario Date of Service: 03/07/2017 3:15 PM Medical Record Number: [id number] Patient Account Number: [id number] Date of Birth/Sex: [birth year] (83 y.o. Female) Treating RN: Montey Hora Primary Care Osa Campoli: Viviana Simpler Other Clinician: Referring Kale Dols: Viviana Simpler Treating Bellany Elbaum/Extender: Frann Rider in Treatment: 3 Active Problems Location of Pain Severity and Description of Pain Patient Has Paino No Site Locations Pain Management and Medication Current Pain Management: Electronic Signature(s) Signed: 03/07/2017 5:58:36 PM By: Montey Hora Entered By: Montey Hora on 03/07/2017 15:34:20 Donna Rosario ([id number]) -------------------------------------------------------------------------------- Patient/Caregiver Education Details Patient Name: Donna Rosario Date of Service: 03/07/2017 3:15 PM Medical Record Number: [id number] Patient Account Number: [id number] Date of Birth/Gender: [birth year] (83 y.o. Female) Treating RN: Montey Hora Primary Care Physician: Viviana Simpler Other Clinician: Referring Physician: Viviana Simpler Treating Physician/Extender: Frann Rider in Treatment: 3 Education Assessment Education Provided To: Caregiver Education Topics Provided Venous: Handouts: Other: leg elevation Methods: Explain/Verbal Responses: State content correctly Electronic Signature(s) Signed: 03/07/2017 5:58:36 PM By:  Montey Hora Entered By: Montey Hora on 03/07/2017 15:40:24 Donna Rosario ([id number]) -------------------------------------------------------------------------------- Wound Assessment Details Patient Name: Donna Rosario Date of Service: 03/07/2017 3:15 PM Medical Record Number: [id number] Patient Account Number: [id number] Date  of Birth/Sex: 29-May-1923 (83 y.o. Female) Treating RN: Montey Hora Primary Care Katryna Tschirhart: Viviana Simpler Other Clinician: Referring Shekela Goodridge: Viviana Simpler Treating Levelle Edelen/Extender: Frann Rider in Treatment: 3 Wound Status Wound Number: 1 Primary Lymphedema Etiology: Wound Location: Right Lower Leg - Circumfernential Wound Status: Open Wounding Event: Gradually Appeared Comorbid Arrhythmia, Hypertension, History: Osteoarthritis, Dementia Date Acquired: 01/14/2017 Weeks Of Treatment: 3 Clustered Wound: Yes Photos Photo Uploaded By: Gretta Cool, BSN, RN, CWS, Kim on 03/07/2017 17:42:51 Wound Measurements Length: (cm) 5.5 Width: (cm) 8 Depth: (cm) 0.1 Clustered Quantity: 7 Area: (cm) 34.558 Volume: (cm) 3.456 % Reduction in Area: 90.8% % Reduction in Volume: 90.8% Epithelialization: Large (67-100%) Tunneling: No Undermining: No Wound Description Full Thickness Without Exposed Classification: Support Structures Wound Margin: Indistinct, nonvisible Exudate Large Amount: Exudate Type: Serous Exudate Color: amber Foul Odor After Cleansing: No Slough/Fibrino No Wound Bed Granulation Amount: Large (67-100%) Exposed Structure Ashraf, Shalamar ([id number]) Necrotic Amount: None Present (0%) Fascia Exposed: No Fat Layer (Subcutaneous Tissue) Exposed: No Tendon Exposed: No Muscle Exposed: No Joint Exposed: No Bone Exposed: No Limited to Skin Breakdown Periwound Skin Texture Texture Color No Abnormalities Noted: No No Abnormalities Noted: No Callus: No Atrophie Blanche: No Crepitus: No Cyanosis: No Excoriation:  Yes Ecchymosis: No Induration: No Erythema: No Rash: No Hemosiderin Staining: Yes Scarring: No Mottled: No Pallor: No Moisture Rubor: No No Abnormalities Noted: No Dry / Scaly: No Maceration: Yes Wound Preparation Ulcer Cleansing: Rinsed/Irrigated with Saline, Other: soap and water, Topical Anesthetic Applied: None Treatment Notes Wound #1 (Right, Circumferential Lower Leg) 1. Cleansed with: Cleanse wound with antibacterial soap and water 4. Dressing Applied: Aquacel Ag 5. Secondary Dressing Applied ABD Pad 7. Secured with 3 Layer Compression System - Left Lower Extremity Electronic Signature(s) Signed: 03/07/2017 5:58:36 PM By: Montey Hora Entered By: Montey Hora on 03/07/2017 15:39:28 Donna Rosario ([id number]) -------------------------------------------------------------------------------- [place] Details Patient Name: Donna Rosario Date of Service: 03/07/2017 3:15 PM Medical Record Number: [id number] Patient Account Number: [id number] Date of Birth/Sex: 1923-03-28 (83 y.o. Female) Treating RN: Montey Hora Primary Care Barby Colvard: Viviana Simpler Other Clinician: Referring Kiegan Macaraeg: Viviana Simpler Treating Twania Bujak/Extender: Frann Rider in Treatment: 3 Vital Signs Time Taken: 15:34 Temperature (F): 97.7 Height (in): 63 Pulse (bpm): 62 Respiratory Rate (breaths/min): 18 Blood Pressure (mmHg): 121/93 Reference Range: 80 - 120 mg / dl Electronic Signature(s) Signed: 03/07/2017 5:58:36 PM By: Montey Hora Entered By: Montey Hora on 03/07/2017 15:34:50

## 2017-03-21 ENCOUNTER — Encounter (INDEPENDENT_AMBULATORY_CARE_PROVIDER_SITE_OTHER): Payer: Self-pay | Admitting: Vascular Surgery

## 2017-03-21 ENCOUNTER — Ambulatory Visit (INDEPENDENT_AMBULATORY_CARE_PROVIDER_SITE_OTHER): Payer: Medicare Other | Admitting: Vascular Surgery

## 2017-03-21 ENCOUNTER — Encounter: Payer: Medicare Other | Admitting: Surgery

## 2017-03-21 VITALS — BP 144/78 | HR 62 | Resp 16 | Ht 64.0 in | Wt 126.0 lb

## 2017-03-21 DIAGNOSIS — I83013 Varicose veins of right lower extremity with ulcer of ankle: Secondary | ICD-10-CM | POA: Insufficient documentation

## 2017-03-21 DIAGNOSIS — I5032 Chronic diastolic (congestive) heart failure: Secondary | ICD-10-CM | POA: Diagnosis not present

## 2017-03-21 DIAGNOSIS — I872 Venous insufficiency (chronic) (peripheral): Secondary | ICD-10-CM | POA: Diagnosis not present

## 2017-03-21 DIAGNOSIS — I482 Chronic atrial fibrillation, unspecified: Secondary | ICD-10-CM

## 2017-03-21 DIAGNOSIS — I639 Cerebral infarction, unspecified: Secondary | ICD-10-CM | POA: Diagnosis not present

## 2017-03-21 DIAGNOSIS — L97319 Non-pressure chronic ulcer of right ankle with unspecified severity: Secondary | ICD-10-CM | POA: Diagnosis not present

## 2017-03-21 DIAGNOSIS — I10 Essential (primary) hypertension: Secondary | ICD-10-CM

## 2017-03-21 DIAGNOSIS — I890 Lymphedema, not elsewhere classified: Secondary | ICD-10-CM | POA: Diagnosis not present

## 2017-03-21 NOTE — Progress Notes (Signed)
MRN : [id number]  Donna Rosario is a 83 y.o. ([birth year]) female who presents with chief complaint of  Chief Complaint  Patient presents with  . Follow-up    To discuss results and a plan  .  History of Present Illness: Patient is seen for evaluation of leg pain and swelling associated with new onset ulceration. The patient first noticed the swelling remotely. The swelling is associated with pain and discoloration. The pain and swelling worsens with prolonged dependency and improves with elevation. The pain is unrelated to activity.  The patient notes that in the morning the legs are better but the leg symptoms worsened throughout the course of the day. The patient has also noted a progressive worsening of the discoloration in the ankle and shin area.   The patient notes that an ulcer has developed acutely without specific trauma and since it occurred it has been very slow to heal.  There is a moderate amount of drainage associated with the open area.  The wound is also very painful.  The patient denies claudication symptoms or rest pain symptoms.  The patient denies DJD and LS spine disease.  The patient has not had any past angiography, interventions or vascular surgery.  Elevation makes the leg symptoms better, dependency makes them much worse. The patient denies any recent changes in medications.  The patient has not been wearing graduated compression.  The patient denies a history of DVT or PE. There is no prior history of phlebitis. There is no history of primary lymphedema.  No history of malignancies. No history of trauma or groin or pelvic surgery. There is no history of radiation treatment to the groin or pelvis      Current Meds  Medication Sig  . alendronate (FOSAMAX) 70 MG tablet Take 1 tablet (70 mg total) by mouth once a week. Take with a full glass of water on an empty stomach.  Marland Kitchen apixaban (ELIQUIS) 5 MG TABS tablet Take 1 tablet (5 mg total) by mouth 2 (two) times  daily.  Francia Greaves THYROID 60 MG tablet TAKE 1 TABLET (60 MG TOTAL) BY MOUTH DAILY.  Marland Kitchen Ascorbic Acid (VITAMIN C) 500 MG tablet Take 500 mg by mouth daily.    Marland Kitchen atorvastatin (LIPITOR) 40 MG tablet Take 1 tablet (40 mg total) by mouth daily at 6 PM.  . Calcium Carbonate-Vit D-Min (CALCIUM 1200 PO) Take 1 capsule by mouth daily.    . digoxin (LANOXIN) 0.125 MG tablet TAKE 1 TABLET EVERY DAY  . felodipine (PLENDIL) 5 MG 24 hr tablet Take 1 tablet (5 mg total) by mouth daily.  . fish oil-omega-3 fatty acids 1000 MG capsule Take 1 g by mouth daily.    Marland Kitchen L-Lysine 500 MG TABS Take 1 tablet by mouth daily.    Marland Kitchen lisinopril (PRINIVIL,ZESTRIL) 20 MG tablet Take 1 tablet (20 mg total) by mouth at bedtime.  . metoprolol (LOPRESSOR) 50 MG tablet Take 1 tablet (50 mg total) by mouth 2 (two) times daily.  . potassium chloride SA (K-DUR,KLOR-CON) 20 MEQ tablet Take by mouth.  . Selenium 200 MCG TABS Take 1 tablet by mouth daily.    . traZODone (DESYREL) 50 MG tablet Take by mouth.    Past Medical History:  Diagnosis Date  . Arthritis   . Atrial fibrillation [facility]) Sept. 2011   [facility] Admission for RVR  . Chronic low back pain    due to lumbosacral degenerative joint disease  . Disturbance of skin sensation   .  Embolic stroke ([place]) 73/5329  . Hyperlipidemia   . Hypertension   . Mitral valve disorders(424.0)   . Osteoporosis   . Sciatica   . Thyroid disease    Hypothyroidism  . Vascular dementia, uncomplicated     Past Surgical History:  Procedure Laterality Date  . JOINT REPLACEMENT     Right Hip Oct 2011,  Left Knee 2008 ([place])  . KNEE SURGERY     Left knee  . THYROIDECTOMY    . TOTAL HIP ARTHROPLASTY  2011    Social History Social History  Substance Use Topics  . Smoking status: Never Smoker  . Smokeless tobacco: Never Used  . Alcohol use 3.0 oz/week    5 Glasses of wine per week     Comment: occasional    Family History Family History  Problem Relation Age of Onset  . Other  Mother        CHF  . Osteoporosis Mother   . Diabetes Mother   . Other Father        unknown  . Osteoporosis Father   . Heart disease Father     Allergies  Allergen Reactions  . Sulfonamide Derivatives      REVIEW OF SYSTEMS (Negative unless checked)  Constitutional: [] Weight loss  [] Fever  [] Chills Cardiac: [] Chest pain   [] Chest pressure   [] Palpitations   [] Shortness of breath when laying flat   [] Shortness of breath with exertion. Vascular:  [] Pain in legs with walking   [] Pain in legs at rest  [] History of DVT   [] Phlebitis   [x] Swelling in legs   [x] Varicose veins   [] Non-healing ulcers Pulmonary:   [] Uses home oxygen   [] Productive cough   [] Hemoptysis   [] Wheeze  [] COPD   [] Asthma Neurologic:  [] Dizziness   [] Seizures   [] History of stroke   [] History of TIA  [] Aphasia   [] Vissual changes   [] Weakness or numbness in arm   [] Weakness or numbness in leg Musculoskeletal:   [] Joint swelling   [] Joint pain   [] Low back pain Hematologic:  [] Easy bruising  [] Easy bleeding   [] Hypercoagulable state   [] Anemic Gastrointestinal:  [] Diarrhea   [] Vomiting  [] Gastroesophageal reflux/heartburn   [] Difficulty swallowing. Genitourinary:  [] Chronic kidney disease   [] Difficult urination  [] Frequent urination   [] Blood in urine Skin:  [] Rashes   [x] Ulcers  Psychological:  [] History of anxiety   []  History of major depression.  Physical Examination  Vitals:   03/21/17 1429  BP: (!) 144/78  Pulse: 62  Resp: 16  Weight: 126 lb (57.2 kg)  Height: 5\' 4"  (1.626 m)   Body mass index is 21.63 kg/m. Gen: WD/WN, NAD Head: [place]/AT, No temporalis wasting.  Ear/Nose/Throat: Hearing grossly intact, nares w/o erythema or drainage Eyes: PER, EOMI, sclera nonicteric.  Neck: Supple, no large masses.   Pulmonary:  Good air movement, no audible wheezing bilaterally, no use of accessory muscles.  Cardiac: RRR, no JVD Vascular: 2-3+ edema of the right leg with severe venous changes of the right leg.   Venous ulcer noted in the ankle area on the right, noninfected Vessel Right Left  Radial Palpable Palpable  PT Palpable Palpable  DP Palpable Palpable  Gastrointestinal: Non-distended. No guarding/no peritoneal signs.  Musculoskeletal: M/S 5/5 throughout.  No deformity or atrophy.  Neurologic: CN 2-12 intact. Symmetrical.  Speech is fluent. Motor exam as listed above. Psychiatric: Judgment intact, Mood & affect appropriate for pt's clinical situation. Dermatologic: Venous stasis dermatitis with ulcers present on the right.  No changes consistent with cellulitis. Lymph : No lichenification or skin changes of chronic lymphedema.  CBC Lab Results  Component Value Date   WBC 10.9 08/05/2016   HGB 14.2 08/05/2016   HCT 42.9 08/05/2016   MCV 89.3 08/05/2016   PLT 208 08/05/2016    BMET    Component Value Date/Time   NA 135 08/07/2016 0452   NA 141 02/24/2015 1041   NA 142 06/08/2014 1940   K 4.0 08/07/2016 0452   K 3.9 06/08/2014 1940   CL 107 08/07/2016 0452   CL 110 (H) 06/08/2014 1940   CO2 23 08/07/2016 0452   CO2 26 06/08/2014 1940   GLUCOSE 105 (H) 08/07/2016 0452   GLUCOSE 105 (H) 06/08/2014 1940   BUN 22 (H) 08/07/2016 0452   BUN 23 02/24/2015 1041   BUN 22 (H) 06/08/2014 1940   CREATININE 0.75 08/07/2016 0452   CREATININE 0.95 06/08/2014 1940   CREATININE 0.95 01/30/2013 1628   CALCIUM 8.5 (L) 08/07/2016 0452   CALCIUM 9.0 06/08/2014 1940   GFRNONAA >60 08/07/2016 0452   GFRNONAA 59 (L) 06/08/2014 1940   GFRAA >60 08/07/2016 0452   GFRAA >60 06/08/2014 1940   CrCl cannot be calculated (Patient's most recent lab result is older than the maximum 21 days allowed.).  COAG No results found for: INR, PROTIME  Radiology No results found.  Assessment/Plan 1. Venous ulcer of ankle, right (HCC) Recommend  I have reviewed my previous  discussion with the patient regarding  varicose veins and why they cause symptoms. Patient will continue  wearing graduated  compression stockings class 1 on a daily basis, beginning first thing in the morning and removing them in the evening.    In addition, behavioral modification including elevation during the day was again discussed and this will continue.  The patient has utilized over the counter pain medications and has been exercising.  However, at this time conservative therapy has not alleviated the patient's symptoms of leg pain and swelling  Recommend: laser ablation of the right great and small saphenous veins to eliminate the symptoms of pain and swelling of the lower extremities caused by the severe superficial venous reflux disease.   2. Venous insufficiency of both lower extremities No surgery or intervention at this point in time.    I have had a long discussion with the patient regarding venous insufficiency and why it  causes symptoms. I have discussed with the patient the chronic skin changes that accompany venous insufficiency and the long term sequela such as infection and ulceration.  Patient will begin wearing graduated compression stockings class 1 (20-30 mmHg) or compression wraps on a daily basis a prescription was given. The patient will put the stockings on first thing in the morning and removing them in the evening. The patient is instructed specifically not to sleep in the stockings.    In addition, behavioral modification including several periods of elevation of the lower extremities during the day will be continued. I have demonstrated that proper elevation is a position with the ankles at heart level.  The patient is instructed to begin routine exercise, especially walking on a daily basis  Following the review of the ultrasound the patient will follow up in 2-3 months to reassess the degree of swelling and the control that graduated compression stockings or compression wraps  is offering.   The patient can be assessed for a Lymph Pump at that time  3. Chronic atrial fibrillation  (HCC) Continue antiarrhythmia medications  as already ordered, these medications have been reviewed and there are no changes at this time.  Continue anticoagulation as ordered by Cardiology Service   4. HYPERTENSION, BENIGN Continue antihypertensive medications as already ordered, these medications have been reviewed and there are no changes at this time.   5. Chronic diastolic CHF (congestive heart failure) (HCC) Continue cardiac and antihypertensive medications as already ordered and reviewed, no changes at this time.  Continue statin as ordered and reviewed, no changes at this time  Nitrates PRN for chest pain     Hortencia Pilar, MD  03/21/2017 3:16 PM

## 2017-03-22 NOTE — Progress Notes (Signed)
Cardiology Office Note  Date:  03/25/2017   ID:  Donna Rosario, DOB [birth year], MRN [id number]  PCP:  Venia Carbon, MD   Chief Complaint  Patient presents with  . other    Patient was last seen 01/2016. Patient c/o swelling in right leg. Meds reviewed verbally with patient.     HPI:  [name] is a 83 year old woman with history of   sciatic and back pain, "crushed right hip", s/p right hip replacement,    hospital September 2011 with atrial fibrillation, started on warfarin and rate control,  dementia and confusion with polypharmacy,  taken off warfarin secondary to inability to manage this herself,   previously started on Pradaxa b.i.d.   We did have 2  nursing agencies try to establish a relationship with her for polypharmacy.  She  discharged them.  Mild chronic lower extremity edema. Significant confusion in the past  with her medications. She presents today for follow-up of her atrial fibrillation  In follow-up today she reports that she feels well, worsening leg swelling Leg wrap on the right, left leg with compression hose Denies any orthostasis symptoms Still goes walking every day No longer swimming Walking with a walker Denies any recent falls Tolerating anticoagulation, eliquis  Felodipine dose looks to be higher than last year previously 2.5 now 5 mg daily  EKG on today's visit shows atrial fibrillation with rate 49 bpm no significant ST or T-wave changes, PVCs noted  Other past medical history In the past, she drank a significant amount of soda/Pepsi.  Previous echocardiogram shows normal systolic function, ejection fraction 55%, mild LVH with diastolic dysfunction, mildly dilated left atrium, mild to moderate MR, moderate TR with elevated right ventricular systolic pressures consistent with mild pulmonary hypertension  PMH:   has a past medical history of Arthritis; Atrial fibrillation [facility]) (Sept. 2011); Chronic low back pain; Disturbance of skin  sensation; Embolic stroke ([place]) (35/5732); Hyperlipidemia; Hypertension; Mitral valve disorders(424.0); Osteoporosis; Sciatica; Thyroid disease; and Vascular dementia, uncomplicated.  PSH:    Past Surgical History:  Procedure Laterality Date  . JOINT REPLACEMENT     Right Hip Oct 2011,  Left Knee 2008 ([place])  . KNEE SURGERY     Left knee  . THYROIDECTOMY    . TOTAL HIP ARTHROPLASTY  2011    Current Outpatient Prescriptions  Medication Sig Dispense Refill  . alendronate (FOSAMAX) 70 MG tablet Take 1 tablet (70 mg total) by mouth once a week. Take with a full glass of water on an empty stomach. 12 tablet 0  . apixaban (ELIQUIS) 5 MG TABS tablet Take 1 tablet (5 mg total) by mouth 2 (two) times daily. 60 tablet 11  . ARMOUR THYROID 60 MG tablet TAKE 1 TABLET (60 MG TOTAL) BY MOUTH DAILY. 90 tablet 3  . Ascorbic Acid (VITAMIN C) 500 MG tablet Take 500 mg by mouth daily.      Marland Kitchen atorvastatin (LIPITOR) 40 MG tablet Take 1 tablet (40 mg total) by mouth daily at 6 PM. 30 tablet 2  . Calcium Carbonate-Vit D-Min (CALCIUM 1200 PO) Take 1 capsule by mouth daily.      . digoxin (LANOXIN) 0.125 MG tablet TAKE 1 TABLET EVERY DAY 90 tablet 0  . fish oil-omega-3 fatty acids 1000 MG capsule Take 1 g by mouth daily.      Marland Kitchen L-Lysine 500 MG TABS Take 1 tablet by mouth daily.      Marland Kitchen lisinopril (PRINIVIL,ZESTRIL) 20 MG tablet Take 1 tablet (20 mg  total) by mouth at bedtime. 30 tablet 2  . metoprolol (LOPRESSOR) 50 MG tablet Take 1 tablet (50 mg total) by mouth 2 (two) times daily. 180 tablet 3  . Selenium 200 MCG TABS Take 1 tablet by mouth daily.       No current facility-administered medications for this visit.      Allergies:   Sulfonamide derivatives   Social History:  The patient  reports that she has never smoked. She has never used smokeless tobacco. She reports that she drinks about 3.0 oz of alcohol per week . She reports that she does not use drugs.   Family History:   family history includes  Diabetes in her mother; Heart disease in her father; Osteoporosis in her father and mother; Other in her father and mother.    Review of Systems: Review of Systems  Constitutional: Negative.   Respiratory: Negative.   Cardiovascular: Positive for leg swelling.  Gastrointestinal: Negative.   Musculoskeletal: Negative.   Neurological: Negative.   Psychiatric/Behavioral: Negative.   All other systems reviewed and are negative.    PHYSICAL EXAM: VS:  BP 130/66 (BP Location: Left Arm, Patient Position: Sitting, Cuff Size: Normal)   Pulse (!) 49   Ht 5\' 3"  (1.6 m)   Wt 125 lb (56.7 kg)   BMI 22.14 kg/m  , BMI Body mass index is 22.14 kg/m. GEN: Well nourished, well developed, in no acute distress  HEENT: normal  Neck: no JVD, carotid bruits, or masses Cardiac: irreg irreg,  2+ RSB SEM, no rubs, or gallops, trace to 1+ pitting edema bilaterally worse on the right than the left, leg wraps in place on the right Respiratory:  clear to auscultation bilaterally, normal work of breathing GI: soft, nontender, nondistended, + BS MS: no deformity or atrophy  Skin: warm and dry, no rash Neuro:  Strength and sensation are intact Psych: euthymic mood, full affect, mild confusion    Recent Labs: 08/05/2016: ALT 15; Hemoglobin 14.2; Platelets 208; TSH 2.556 08/07/2016: BUN 22; Creatinine, Ser 0.75; Potassium 4.0; Sodium 135    Lipid Panel Lab Results  Component Value Date   CHOL 215 (H) 01/12/2014   HDL 54.70 01/12/2014   LDLCALC 127 (H) 01/12/2014   TRIG 165.0 (H) 01/12/2014      Wt Readings from Last 3 Encounters:  03/25/17 125 lb (56.7 kg)  03/21/17 126 lb (57.2 kg)  02/06/17 128 lb (58.1 kg)       ASSESSMENT AND PLAN:  Chronic atrial fibrillation ([place]) - Plan: EKG 12-Lead Heart rate is slow we will hold the digoxin and decrease metoprolol down to 25 mg twice a day  Chronic diastolic CHF (congestive heart failure) ([place]) - Plan: EKG 12-Lead She is on Lasix 40 mg  daily Leg swelling likely secondary to felodipine  Dizziness We will medication changes given her bradycardia has detailed  HYPERTENSION, BENIGN Recommended she hold felodipine given side effect of leg swelling Increase lisinopril up to 40 mg daily Given bradycardia we'll decrease metoprolol down to 25 mg twice a day  Dementia, without behavioral disturbance Living in assisted facility   Total encounter time more than 25 minutes  Greater than 50% was spent in counseling and coordination of care with the patient   Disposition:   F/U  6 months   No orders of the defined types were placed in this encounter.    Signed, Esmond Plants, M.D., Ph.D. 03/25/2017  [place], [place]

## 2017-03-23 NOTE — Progress Notes (Addendum)
[name], [name] ([id number]) Visit Report for 03/21/2017 Chief Complaint Document Details Patient Name: Donna Rosario, Donna Rosario Date of Service: 03/21/2017 1:30 PM Medical Record Number: [id number] Patient Account Number: [id number] Date of Birth/Sex: 11-28-1922 (83 y.o. Female) Treating RN: Montey Hora Primary Care Provider: Viviana Simpler Other Clinician: Referring Provider: Viviana Simpler Treating Provider/Extender: Frann Rider in Treatment: 5 Information Obtained from: Patient Chief Complaint Patient presents to the wound care center for a consult due non healing wound to her right lower extremity which she's had for about 6 weeks Electronic Signature(s) Signed: 03/21/2017 4:10:10 PM By: Christin Fudge MD, FACS Entered By: Christin Fudge on 03/21/2017 13:46:13 Donna Rosario ([id number]) -------------------------------------------------------------------------------- HPI Details Patient Name: Donna Rosario Date of Service: 03/21/2017 1:30 PM Medical Record Number: [id number] Patient Account Number: [id number] Date of Birth/Sex: 03/12/1923 (83 y.o. Female) Treating RN: Montey Hora Primary Care Provider: Viviana Simpler Other Clinician: Referring Provider: Viviana Simpler Treating Provider/Extender: Frann Rider in Treatment: 5 History of Present Illness Location: swelling and ulceration on the right lower extremity Quality: Patient reports experiencing a dull pain to affected area(s). Severity: Patient states wound are getting worse. Duration: Patient has had the wound for < 8 weeks prior to presenting for treatment Timing: Pain in wound is Intermittent (comes and goes Context: The wound would happen gradually Modifying Factors: Other treatment(s) tried include:various types of wraps have been applied Associated Signs and Symptoms: Patient reports having increase swelling. HPI Description: 83 year old patient with a personal history of TIA, vascular dementia, delirium,  atrial fibrillation, essential hypertension, hypothyroidism, nonrheumatic mitral valve disorder and osteoporosis was sent to see [place] with weeping edema of the right lower extremity which has failed local care. She also has a past surgical history of right hip joint replacement and a left knee replacement in 2008. She is also had a thyroidectomy and a total hip arthroplasty. she has never been a smoker. Her PCP treated her with furosemide for her chronic diastolic CHF, Eliquis for her atrial fibrillation,and though the diagnosis of venous insufficiency was made I believe there are no pertinent workup available. Her power of attorney of family members not here at the bedside so we do not know details of how aggressive they want to be with therapy. 02/21/2017 -- the patient's daughter, who has the power of attorney, spoke to my clinical nurse manager who discussed the question whether we should proceed with venous duplex reflux studies and the daughter wanted [place] to go ahead and do the requisite tests. 03/01/2017 -- had a lower extremity venous reflux examination done which showed no evidence of DVT or SVT. There was incompetence of the right deep venous system, incompetence of the right great saphenous vein and incompetence of the right small saphenous vein. The left lower extremity study was not done With this result I would definitely recommend a vascular surgeon's opinion for possible endovenous ablation and the patient's power of attorney and I will have a conversation regarding this. 03/07/2017 -- she is awaiting a vascular surgeon's opinion which is scheduled for September 27 Electronic Signature(s) Signed: 03/21/2017 4:10:10 PM By: Christin Fudge MD, FACS Entered By: Christin Fudge on 03/21/2017 13:46:25 Donna Rosario ([id number]) -------------------------------------------------------------------------------- Physical Exam Details Patient Name: Donna Rosario Date of Service: 03/21/2017 1:30  PM Medical Record Number: [id number] Patient Account Number: [id number] Date of Birth/Sex: Jan 17, 1923 (83 y.o. Female) Treating RN: Montey Hora Primary Care Provider: Viviana Simpler Other Clinician: Referring Provider: Viviana Simpler Treating Provider/Extender: Frann Rider in Treatment: 5 Constitutional . Pulse regular. Respirations normal and  unlabored. Afebrile. . Eyes Nonicteric. Reactive to light. Ears, Nose, Mouth, and Throat Lips, teeth, and gums WNL.Marland Kitchen Moist mucosa without lesions. Neck supple and nontender. No palpable supraclavicular or cervical adenopathy. Normal sized without goiter. Respiratory WNL. No retractions.. Cardiovascular Pedal Pulses WNL. No clubbing, cyanosis or edema. Lymphatic No adneopathy. No adenopathy. No adenopathy. Musculoskeletal Adexa without tenderness or enlargement.. Digits and nails w/o clubbing, cyanosis, infection, petechiae, ischemia, or inflammatory conditions.. Integumentary (Hair, Skin) No suspicious lesions. No crepitus or fluctuance. No peri-wound warmth or erythema. No masses.Marland Kitchen Psychiatric Judgement and insight Intact.. No evidence of depression, anxiety, or agitation.. Notes the wounds have almost completely healed and there are micro-ulcerations and minimal oozing from the medial calf area. Other than that the lymphedema is also well controlled. Electronic Signature(s) Signed: 03/21/2017 4:10:10 PM By: Christin Fudge MD, FACS Entered By: Christin Fudge on 03/21/2017 13:47:03 Donna Rosario ([id number]) -------------------------------------------------------------------------------- Physician Orders Details Patient Name: Donna Rosario Date of Service: 03/21/2017 1:30 PM Medical Record Number: [id number] Patient Account Number: [id number] Date of Birth/Sex: 1923-01-05 (83 y.o. 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 o May shower with protection. - can be removed before nurse is scheduled to change dressing. Skin Barriers/Peri-Wound Care Wound #1 Right,Circumferential Lower Leg o Barrier cream Primary Wound Dressing Wound #1 Right,Circumferential Lower Leg o Aquacel Ag Secondary Dressing Wound #1 Right,Circumferential Lower Leg o ABD pad Dressing Change Frequency Wound #1 Right,Circumferential Lower Leg o Change Dressing Monday, Wednesday, Friday Follow-up Appointments Wound #1 Right,Circumferential Lower Leg o Return Appointment in 1 week. Edema Control Zamor, Lizvette ([id number]) Wound #1 Right,Circumferential Lower Leg o 3 Layer Compression System - Right Lower Extremity - Please wrap from base of toes to 3 finger widths below knee. YouTube has many videos on how to apply this wrap appropriately o Elevate legs to the level of the heart and pump ankles as often as possible - Do not sit with legs dangling down, please elevate. Electronic Signature(s) Signed: 03/21/2017 4:10:10 PM By: Christin Fudge MD, FACS Signed: 03/21/2017 4:26:27 PM By: Montey Hora Entered By: Montey Hora on 03/21/2017 13:46:58 Donna Rosario ([id number]) -------------------------------------------------------------------------------- Problem List Details Patient Name: Donna Rosario Date of Service: 03/21/2017 1:30 PM Medical Record Number:  [id number] Patient Account Number: [id number] Date of Birth/Sex: [birth year] (83 y.o. 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 Female) Treating RN: Montey Hora Primary Care Provider: Viviana Simpler Other Clinician: Referring Provider: Viviana Simpler Treating Provider/Extender: Frann Rider in Treatment: 5 Subjective Chief Complaint Information obtained from Patient Patient presents to the wound care center for a consult due non healing wound to her right lower extremity which she's had for about 6 weeks History of Present Illness (HPI) The following HPI elements were documented for the patient's wound: Location: swelling and ulceration on the right lower extremity Quality: Patient reports experiencing a dull pain to affected area(s). Severity: Patient states wound are getting worse. Duration: Patient has had the wound for  < 8 weeks prior to presenting for treatment Timing: Pain in wound is Intermittent (comes and goes Context: The wound would happen gradually Modifying Factors: Other treatment(s) tried include:various types of wraps have been applied Associated Signs and Symptoms: Patient reports having increase swelling. 83 year old patient with a personal history of TIA, vascular dementia, delirium, atrial fibrillation, essential hypertension, hypothyroidism, nonrheumatic mitral valve disorder and osteoporosis was sent to see [place] with weeping edema of the right lower extremity which has failed local care. She also has a past surgical history of right hip joint replacement and a left knee replacement in 2008. She is also had a thyroidectomy and a total hip arthroplasty. she has never been a smoker. Her PCP treated her with furosemide for her chronic diastolic CHF, Eliquis for her atrial fibrillation,and though the diagnosis of venous insufficiency was made I believe there are no pertinent workup available. Her power of attorney of family members not here at the bedside so we do not know details of how aggressive they want to be with therapy. 02/21/2017 -- the patient's daughter, who has the power of attorney, spoke to my clinical nurse manager who discussed the question whether we should proceed with venous duplex reflux studies and the daughter wanted [place] to go ahead and do the requisite tests. 03/01/2017 -- had a lower extremity venous reflux examination done which showed no evidence of DVT or SVT. There was incompetence of the right deep venous system, incompetence of the right great saphenous vein and incompetence of the right small saphenous vein. The left lower extremity study was not done With this result I would definitely recommend a vascular surgeon's opinion for possible endovenous ablation and the patient's power of attorney and I will have a conversation regarding this. Donna Rosario, Donna Rosario  ([id number]) 03/07/2017 -- she is awaiting a vascular surgeon's opinion which is scheduled for September 27 Objective Constitutional Pulse regular. Respirations normal and unlabored. Afebrile. Vitals Time Taken: 1:29 PM, Height: 63 in, Temperature: 97.7 F, Pulse: 53 bpm, Respiratory Rate: 18 breaths/min, Blood Pressure: 140/55 mmHg. Eyes Nonicteric. Reactive to light. Ears, Nose, Mouth, and Throat Lips, teeth, and gums WNL.Marland Kitchen Moist mucosa without lesions. Neck supple and nontender. No palpable supraclavicular or cervical adenopathy. Normal sized without goiter. Respiratory WNL. No retractions.. Cardiovascular Pedal Pulses WNL. No clubbing, cyanosis or edema. Lymphatic No adneopathy. No adenopathy. No adenopathy. Musculoskeletal Adexa without tenderness or enlargement.. Digits and nails w/o clubbing, cyanosis, infection, petechiae, ischemia, or inflammatory conditions.Marland Kitchen Psychiatric Judgement and insight Intact.. No evidence of depression, anxiety, or agitation.. General Notes: the wounds have almost completely healed and there are micro-ulcerations and minimal oozing from the medial calf area. Other than that the lymphedema is also well controlled. Integumentary (Hair, Skin) No suspicious lesions. No crepitus or fluctuance. No  peri-wound warmth or erythema. No masses.Donna Rosario, Donna Rosario ([id number]) Wound #1 status is Open. Original cause of wound was Gradually Appeared. The wound is located on the Right,Circumferential Lower Leg. The wound measures 5.5cm length x 8cm width x 0.1cm depth; 34.558cm^2 area and 3.456cm^3 volume. The wound is limited to skin breakdown. There is no tunneling or undermining noted. There is a large amount of serosanguineous drainage noted. The wound margin is indistinct and nonvisible. There is large (67-100%) granulation within the wound bed. There is a small (1-33%) amount of necrotic tissue within the wound bed including Eschar and Adherent Slough.  The periwound skin appearance exhibited: Excoriation, Maceration, Hemosiderin Staining. The periwound skin appearance did not exhibit: Callus, Crepitus, Induration, Rash, Scarring, Dry/Scaly, Atrophie Blanche, Cyanosis, Ecchymosis, Mottled, Pallor, Rubor, Erythema. Periwound temperature was noted as No Abnormality. The periwound has tenderness on palpation. 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 There was a Three Layer Compression Therapy Procedure by Montey Hora, RN. Post procedure Diagnosis Wound #1: Same as Pre-Procedure Plan Wound Cleansing: Wound #1 Right,Circumferential Lower Leg: May Shower, gently pat wound dry prior to applying new dressing. May shower with protection. - can be removed before nurse is scheduled to change dressing. Skin Barriers/Peri-Wound Care: Donna Rosario, Donna Rosario ([id number]) Wound #1 Right,Circumferential Lower Leg: Barrier cream Primary Wound Dressing: Wound #1 Right,Circumferential Lower Leg: Aquacel Ag Secondary Dressing: Wound #1 Right,Circumferential Lower Leg: ABD pad Dressing Change Frequency: Wound #1 Right,Circumferential Lower Leg: Change Dressing Monday, Wednesday, Friday Follow-up Appointments: Wound #1 Right,Circumferential Lower Leg: Return Appointment in 1 week. Edema Control: Wound #1 Right,Circumferential Lower Leg: 3 Layer Compression System - Right Lower Extremity - Please wrap from base of toes to 3 finger widths below knee. YouTube has many videos on how to apply this wrap appropriately Elevate legs to the  level of the heart and pump ankles as often as possible - Do not sit with legs dangling down, please elevate. I have recommended: 1. silver alginate and a 3 layer Profore wrap 2. elevation and exercise has been discussed with her 3. we will await the vascular surgeon's opinion, which is later this afternoon 4. regular visits to the wound center Electronic Signature(s) Signed: 03/25/2017 7:54:23 AM By: Christin Fudge MD, FACS Previous Signature: 03/21/2017 4:10:10 PM Version By: Christin Fudge MD, FACS Entered By: Christin Fudge on 03/25/2017 07:54:23 Donna Rosario ([id number]) -------------------------------------------------------------------------------- SuperBill Details Patient Name: Donna Rosario Date of Service: 03/21/2017 Medical Record Number: [id number] Patient Account Number: [id number] Date of Birth/Sex: [birth year] (83 y.o. 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 Facility Procedures CPT4: Description Modifier Quantity Code [id number] (Facility Use Only) 33825KN - [place] L[facility] RT 1 LEG Physician Procedures CPT4 Code: [id number] Description: 19379 - WC PHYS LEVEL 3 - EST  PT ICD-10 Description Diagnosis I89.0 Lymphedema, not elsewhere classified I83.013 Varicose veins of right lower extremity with ulce I83.012 Varicose veins of right lower extremity with ulce L97.212 Non-pressure  chronic ulcer of right calf with fat Modifier: r of ankle r of calf layer exposed Quantity:  1 Electronic Signature(s) Signed: 03/25/2017 7:54:31 AM By: Christin Fudge MD, FACS Previous Signature: 03/21/2017 4:26:27 PM Version By: Montey Hora Previous Signature: 03/25/2017 7:53:39 AM Version By: Christin Fudge MD, FACS Previous Signature: 03/21/2017 4:10:10 PM Version By: Christin Fudge MD, FACS Entered By: Christin Fudge on 03/25/2017 07:54:31

## 2017-03-25 ENCOUNTER — Ambulatory Visit (INDEPENDENT_AMBULATORY_CARE_PROVIDER_SITE_OTHER): Payer: Medicare Other | Admitting: Cardiovascular Disease

## 2017-03-25 ENCOUNTER — Encounter: Payer: Self-pay | Admitting: Cardiovascular Disease

## 2017-03-25 VITALS — BP 130/66 | HR 49 | Ht 63.0 in | Wt 125.0 lb

## 2017-03-25 DIAGNOSIS — I872 Venous insufficiency (chronic) (peripheral): Secondary | ICD-10-CM

## 2017-03-25 DIAGNOSIS — F0150 Vascular dementia without behavioral disturbance: Secondary | ICD-10-CM

## 2017-03-25 DIAGNOSIS — I5032 Chronic diastolic (congestive) heart failure: Secondary | ICD-10-CM | POA: Diagnosis not present

## 2017-03-25 DIAGNOSIS — I10 Essential (primary) hypertension: Secondary | ICD-10-CM

## 2017-03-25 DIAGNOSIS — I482 Chronic atrial fibrillation, unspecified: Secondary | ICD-10-CM

## 2017-03-25 DIAGNOSIS — I639 Cerebral infarction, unspecified: Secondary | ICD-10-CM | POA: Diagnosis not present

## 2017-03-25 MED ORDER — METOPROLOL TARTRATE 25 MG PO TABS: 25.0000 mg | ORAL_TABLET | Freq: Two times a day (BID) | ORAL | 3 refills | Status: AC

## 2017-03-25 MED ORDER — LISINOPRIL 40 MG PO TABS: 40.0000 mg | ORAL_TABLET | Freq: Every day | ORAL | 3 refills | Status: AC

## 2017-03-25 NOTE — Progress Notes (Addendum)
OYUKI, HOGAN ([id number]) Visit Report for 03/21/2017 Arrival Information Details Patient Name: Donna Rosario, Donna Rosario Date of Service: 03/21/2017 1:30 PM Medical Record Number: [id number] Patient Account Number: [id number] Date of Birth/Sex: 1922-11-15 (83 y.o. Female) Treating RN: Montey Hora Primary Care Burnice Vassel: Viviana Simpler Other Clinician: Referring Kris Burd: Viviana Simpler Treating Devora Tortorella/Extender: Frann Rider in Treatment: 5 Visit Information History Since Last Visit All ordered tests and consults were completed: No Patient Arrived: Gilford Rile Added or deleted any medications: No Arrival Time: 13:22 Any new allergies or adverse reactions: No Accompanied By: caregiver Had a fall or experienced change in No Transfer Assistance: EasyPivot Patient activities of daily living that may affect Lift risk of falls: Patient Identification Verified: Yes Signs or symptoms of abuse/neglect since last visito No Secondary Verification Process Yes Hospitalized since last visit: No Completed: Has Dressing in Place as Prescribed: Yes Patient Requires Transmission-Based No Precautions: Has Compression in Place as Prescribed: Yes Patient Has Alerts: Yes Pain Present Now: No Patient Alerts: Patient on Blood Thinner Eliquis Not Diabetic Electronic Signature(s) Signed: 03/21/2017 4:26:27 PM By: Montey Hora Entered By: Montey Hora on 03/21/2017 13:29:14 Donna Rosario ([id number]) -------------------------------------------------------------------------------- Compression Therapy Details Patient Name: Donna Rosario Date of Service: 03/21/2017 1:30 PM Medical Record Number: [id number] Patient Account Number: [id number] Date of Birth/Sex: 04-17-1923 (83 y.o. Female) Treating RN: Montey Hora Primary Care Shirline Kendle: Viviana Simpler Other Clinician: Referring Brees Hounshell: Viviana Simpler Treating Keagen Heinlen/Extender: Frann Rider in Treatment: 5 Compression Therapy Performed for  Wound Assessment: Wound #1 Right,Circumferential Lower Leg Performed By: Clinician Montey Hora, RN Compression Type: Three Layer Post Procedure Diagnosis Same as Pre-procedure Electronic Signature(s) Signed: 03/21/2017 4:26:27 PM By: Montey Hora Entered By: Montey Hora on 03/21/2017 16:17:03 Donna Rosario ([id number]) -------------------------------------------------------------------------------- Encounter Discharge Information Details Patient Name: Donna Rosario Date of Service: 03/21/2017 1:30 PM Medical Record Number: [id number] Patient Account Number: [id number] Date of Birth/Sex: 11/28/1922 (83 y.o. Female) Treating RN: Montey Hora Primary Care Tel Hevia: Viviana Simpler Other Clinician: Referring Jakiah Goree: Viviana Simpler Treating Montia Haslip/Extender: Frann Rider in Treatment: 5 Encounter Discharge Information Items Discharge Pain Level: 0 Discharge Condition: Stable Ambulatory Status: Walker Discharge Destination: Home Transportation: Private Auto Accompanied By: staff Schedule Follow-up Appointment: Yes Medication Reconciliation completed and No provided to Patient/Care Chistopher Mangino: Provided on Clinical Summary of Care: 03/21/2017 Form Type Recipient Paper Patient DD Electronic Signature(s) Signed: 03/25/2017 9:00:05 AM By: Ruthine Dose Entered By: Ruthine Dose on 03/21/2017 14:02:50 Donna Rosario ([id number]) -------------------------------------------------------------------------------- Lower Extremity Assessment Details Patient Name: Donna Rosario Date of Service: 03/21/2017 1:30 PM Medical Record Number: [id number] Patient Account Number: [id number] Date of Birth/Sex: 07/22/1922 (83 y.o. Female) Treating RN: Montey Hora Primary Care Brighton Delio: Viviana Simpler Other Clinician: Referring Deetra Booton: Viviana Simpler Treating Penny Arrambide/Extender: Frann Rider in Treatment: 5 Edema Assessment Assessed: Shirlyn Goltz: No] Patrice Paradise: No] [Left: Edema]  [Right: :] Calf Left: Right: Point of Measurement: 28 cm From Medial Instep cm 32 cm Ankle Left: Right: Point of Measurement: 9 cm From Medial Instep cm 19.6 cm Vascular Assessment Pulses: Dorsalis Pedis Palpable: [Right:Yes] Posterior Tibial Extremity colors, hair growth, and conditions: Extremity Color: [Right:Hyperpigmented] Temperature of Extremity: [Right:Warm] Capillary Refill: [Right:< 3 seconds] Toe Nail Assessment Left: Right: Thick: Yes Discolored: Yes Deformed: Yes Improper Length and Hygiene: Yes Electronic Signature(s) Signed: 03/21/2017 4:26:27 PM By: Montey Hora Entered By: Montey Hora on 03/21/2017 13:41:28 Donna Rosario ([id number]) -------------------------------------------------------------------------------- Multi Wound Chart Details Patient Name: Donna Rosario Date of Service: 03/21/2017 1:30 PM Medical Record Number: [id number] Patient Account Number: [id number] Date of Birth/Sex: November 27, 1922 (83 y.o. Female)  Treating RN: Montey Hora Primary Care Thien Berka: Viviana Simpler Other Clinician: Referring Alexavier Tsutsui: Viviana Simpler Treating Giulietta Prokop/Extender: Frann Rider in Treatment: 5 Vital Signs Height(in): 42 Pulse(bpm): 22 Weight(lbs): Blood Pressure(mmHg): 140/55 Body Mass Index(BMI): Temperature(F): 97.7 Respiratory Rate 18 (breaths/min): Photos: [1:No Photos] [N/A:N/A] Wound Location: [1:Right Lower Leg - Circumfernential] [N/A:N/A] Wounding Event: [1:Gradually Appeared] [N/A:N/A] Primary Etiology: [1:Lymphedema] [N/A:N/A] Comorbid History: [1:Arrhythmia, Hypertension, Osteoarthritis, Dementia] [N/A:N/A] Date Acquired: [1:01/14/2017] [N/A:N/A] Weeks of Treatment: [1:5] [N/A:N/A] Wound Status: [1:Open] [N/A:N/A] Clustered Wound: [1:Yes] [N/A:N/A] Clustered Quantity: [1:7] [N/A:N/A] Measurements L x W x D [1:5.5x8x0.1] [N/A:N/A] (cm) Area (cm) : [1:34.558] [N/A:N/A] Volume (cm) : [1:3.456] [N/A:N/A] % Reduction in Area:  [1:90.80%] [N/A:N/A] % Reduction in Volume: [1:90.80%] [N/A:N/A] Classification: [1:Full Thickness Without Exposed Support Structures] [N/A:N/A] Exudate Amount: [1:Large] [N/A:N/A] Exudate Type: [1:Serosanguineous] [N/A:N/A] Exudate Color: [1:red, brown] [N/A:N/A] Wound Margin: [1:Indistinct, nonvisible] [N/A:N/A] Granulation Amount: [1:Large (67-100%)] [N/A:N/A] Necrotic Amount: [1:Small (1-33%)] [N/A:N/A] Necrotic Tissue: [1:Eschar, Adherent Slough] [N/A:N/A] Exposed Structures: [1:Fascia: No Fat Layer (Subcutaneous Tissue) Exposed: No Tendon: No Muscle: No Joint: No Bone: No Limited to Skin Breakdown] [N/A:N/A] Epithelialization: [1:Large (67-100%)] [N/A:N/A] Periwound Skin Texture: [N/A:N/A] Excoriation: Yes Induration: No Callus: No Crepitus: No Rash: No Scarring: No Periwound Skin Moisture: Maceration: Yes N/A N/A Dry/Scaly: No Periwound Skin Color: Hemosiderin Staining: Yes N/A N/A Atrophie Blanche: No Cyanosis: No Ecchymosis: No Erythema: No Mottled: No Pallor: No Rubor: No Temperature: No Abnormality N/A N/A Tenderness on Palpation: Yes N/A N/A Wound Preparation: Ulcer Cleansing: N/A N/A Rinsed/Irrigated with Saline, Other: soap and water Topical Anesthetic Applied: None Treatment Notes Electronic Signature(s) Signed: 03/21/2017 4:10:10 PM By: Christin Fudge MD, FACS Entered By: Christin Fudge on 03/21/2017 13:46:05 Donna Rosario ([id number]) -------------------------------------------------------------------------------- [place] Details Patient Name: Donna Rosario Date of Service: 03/21/2017 1:30 PM Medical Record Number: [id number] Patient Account Number: [id number] Date of Birth/Sex: July 18, 1922 (83 y.o. Female) Treating RN: Montey Hora Primary Care Elnore Cosens: Viviana Simpler Other Clinician: Referring Justyce Baby: Viviana Simpler Treating Mistey Hoffert/Extender: Frann Rider in Treatment: 5 Active Inactive Electronic  Signature(s) Signed: 04/08/2017 10:20:29 AM By: Gretta Cool BSN, RN, CWS, Kim RN, BSN Signed: 05/27/2017 12:38:39 PM By: Montey Hora Previous Signature: 03/21/2017 4:26:27 PM Version By: Montey Hora Entered By: Gretta Cool BSN, RN, CWS, Kim on 04/08/2017 10:20:28 Donna Rosario ([id number]) -------------------------------------------------------------------------------- Pain Assessment Details Patient Name: Donna Rosario Date of Service: 03/21/2017 1:30 PM Medical Record Number: [id number] Patient Account Number: [id number] Date of Birth/Sex: 1923/05/18 (83 y.o. Female) Treating RN: Montey Hora Primary Care Kirsten Spearing: Viviana Simpler Other Clinician: Referring Korissa Horsford: Viviana Simpler Treating Derold Dorsch/Extender: Frann Rider in Treatment: 5 Active Problems Location of Pain Severity and Description of Pain Patient Has Paino No Site Locations Pain Management and Medication Current Pain Management: Electronic Signature(s) Signed: 03/21/2017 4:26:27 PM By: Montey Hora Entered By: Montey Hora on 03/21/2017 13:29:20 Donna Rosario ([id number]) -------------------------------------------------------------------------------- Patient/Caregiver Education Details Patient Name: Donna Rosario Date of Service: 03/21/2017 1:30 PM Medical Record Number: [id number] Patient Account Number: [id number] Date of Birth/Gender: 1923-02-06 (83 y.o. Female) Treating RN: Montey Hora Primary Care Physician: Viviana Simpler Other Clinician: Referring Physician: Viviana Simpler Treating Physician/Extender: Frann Rider in Treatment: 5 Education Assessment Education Provided To: Caregiver Education Topics Provided Venous: Handouts: Other: leg elevation Methods: Explain/Verbal Responses: State content correctly Electronic Signature(s) Signed: 03/21/2017 4:26:27 PM By: Montey Hora Entered By: Montey Hora on 03/21/2017 13:46:29 Donna Rosario  ([id number]) -------------------------------------------------------------------------------- Wound Assessment Details Patient Name: Donna Rosario Date of Service: 03/21/2017 1:30 PM Medical Record Number: [id number] Patient Account Number: [id number] Date of  Birth/Sex: [birth year] (83 y.o. Female) Treating RN: Montey Hora Primary Care Adelaido Nicklaus: Viviana Simpler Other Clinician: Referring Christpher Stogsdill: Viviana Simpler Treating Miki Labuda/Extender: Frann Rider in Treatment: 5 Wound Status Wound Number: 1 Primary Lymphedema Etiology: Wound Location: Right Lower Leg - Circumfernential Wound Status: Open Wounding Event: Gradually Appeared Comorbid Arrhythmia, Hypertension, Osteoarthritis, Date Acquired: 01/14/2017 History: Dementia Weeks Of Treatment: 5 Clustered Wound: Yes Photos Photo Uploaded By: Montey Hora on 03/21/2017 16:31:14 Wound Measurements Length: (cm) 5.5 Width: (cm) 8 Depth: (cm) 0.1 Clustered Quantity: 7 Area: (cm) 34.558 Volume: (cm) 3.456 % Reduction in Area: 90.8% % Reduction in Volume: 90.8% Epithelialization: Large (67-100%) Tunneling: No Undermining: No Wound Description Full Thickness Without Exposed Support Classification: Structures Wound Margin: Indistinct, nonvisible Exudate Large Amount: Exudate Type: Serosanguineous Exudate Color: red, brown Foul Odor After Cleansing: No Slough/Fibrino Yes Wound Bed Granulation Amount: Large (67-100%) Exposed Structure Necrotic Amount: Small (1-33%) Fascia Exposed: No Necrotic Quality: Eschar, Adherent Slough Fat Layer (Subcutaneous Tissue) Exposed: No Tendon Exposed: No Muscle Exposed: No Joint Exposed: No Craw, Ahonesty ([id number]) Bone Exposed: No Limited to Skin Breakdown Periwound Skin Texture Texture Color No Abnormalities Noted: No No Abnormalities Noted: No Callus: No Atrophie Blanche: No Crepitus: No Cyanosis: No Excoriation: Yes Ecchymosis: No Induration: No Erythema:  No Rash: No Hemosiderin Staining: Yes Scarring: No Mottled: No Pallor: No Moisture Rubor: No No Abnormalities Noted: No Dry / Scaly: No Temperature / Pain Maceration: Yes Temperature: No Abnormality Tenderness on Palpation: Yes Wound Preparation Ulcer Cleansing: Rinsed/Irrigated with Saline, Other: soap and water, Topical Anesthetic Applied: None Electronic Signature(s) Signed: 03/21/2017 4:26:27 PM By: Montey Hora Entered By: Montey Hora on 03/21/2017 13:33:46 Donna Rosario ([id number]) -------------------------------------------------------------------------------- Vitals Details Patient Name: Donna Rosario Date of Service: 03/21/2017 1:30 PM Medical Record Number: [id number] Patient Account Number: [id number] Date of Birth/Sex: 02/16/1923 (83 y.o. Female) Treating RN: Montey Hora Primary Care Gunda Maqueda: Viviana Simpler Other Clinician: Referring Maysen Bonsignore: Viviana Simpler Treating Vincie Linn/Extender: Frann Rider in Treatment: 5 Vital Signs Time Taken: 13:29 Temperature (F): 97.7 Height (in): 63 Pulse (bpm): 53 Respiratory Rate (breaths/min): 18 Blood Pressure (mmHg): 140/55 Reference Range: 80 - 120 mg / dl Electronic Signature(s) Signed: 03/21/2017 4:26:27 PM By: Montey Hora Entered By: Montey Hora on 03/21/2017 13:29:41

## 2017-03-25 NOTE — Patient Instructions (Addendum)
Medication Instructions:   Stop the felodipine, this can cause leg swelling (can take one month after stopping medication to get better)  Stop digoxin, heart rate is slow  Please cut the metoprolol down to 25 mg twice a day Heart rate is slow on EKG today  Please increase the lisinopril up to 40 mg daily To make up for stopping medication above   Labwork:  No new labs needed  Testing/Procedures:  No further testing at this time   Follow-Up: It was a pleasure seeing you in the office today. Please call us if you have new issues that need to be addressed before your next appt.  [phone]  Your physician wants you to follow-up in: 6 months.  You will receive a reminder letter in the mail two months in advance. If you don't receive a letter, please call our office to schedule the follow-up appointment.  If you need a refill on your cardiac medications before your next appointment, please call your pharmacy.

## 2017-04-04 ENCOUNTER — Ambulatory Visit: Payer: Medicare Other | Admitting: Surgery

## 2017-04-19 ENCOUNTER — Ambulatory Visit: Payer: Medicare Other | Admitting: Internal Medicine

## 2017-04-19 DIAGNOSIS — I83013 Varicose veins of right lower extremity with ulcer of ankle: Secondary | ICD-10-CM

## 2017-04-19 DIAGNOSIS — L97319 Non-pressure chronic ulcer of right ankle with unspecified severity: Secondary | ICD-10-CM

## 2017-04-19 DIAGNOSIS — M47816 Spondylosis without myelopathy or radiculopathy, lumbar region: Secondary | ICD-10-CM | POA: Diagnosis not present

## 2017-04-19 DIAGNOSIS — E039 Hypothyroidism, unspecified: Secondary | ICD-10-CM | POA: Diagnosis not present

## 2017-04-19 DIAGNOSIS — M810 Age-related osteoporosis without current pathological fracture: Secondary | ICD-10-CM

## 2017-04-19 DIAGNOSIS — I482 Chronic atrial fibrillation, unspecified: Secondary | ICD-10-CM

## 2017-04-19 DIAGNOSIS — I10 Essential (primary) hypertension: Secondary | ICD-10-CM | POA: Diagnosis not present

## 2017-04-19 DIAGNOSIS — I5032 Chronic diastolic (congestive) heart failure: Secondary | ICD-10-CM | POA: Diagnosis not present

## 2017-04-19 DIAGNOSIS — F0150 Vascular dementia without behavioral disturbance: Secondary | ICD-10-CM | POA: Diagnosis not present

## 2017-04-19 DIAGNOSIS — E782 Mixed hyperlipidemia: Secondary | ICD-10-CM

## 2017-04-19 DIAGNOSIS — Z8673 Personal history of transient ischemic attack (TIA), and cerebral infarction without residual deficits: Secondary | ICD-10-CM

## 2017-04-24 ENCOUNTER — Encounter: Payer: Self-pay | Admitting: Internal Medicine

## 2017-04-24 DIAGNOSIS — E785 Hyperlipidemia, unspecified: Secondary | ICD-10-CM | POA: Insufficient documentation

## 2017-04-24 DIAGNOSIS — M810 Age-related osteoporosis without current pathological fracture: Secondary | ICD-10-CM | POA: Insufficient documentation

## 2017-04-24 DIAGNOSIS — M1990 Unspecified osteoarthritis, unspecified site: Secondary | ICD-10-CM | POA: Insufficient documentation

## 2017-04-24 DIAGNOSIS — Z8673 Personal history of transient ischemic attack (TIA), and cerebral infarction without residual deficits: Secondary | ICD-10-CM | POA: Insufficient documentation

## 2017-04-24 NOTE — Assessment & Plan Note (Signed)
Continue Atorvastatin and Fish Oil Consume a low fat diet

## 2017-04-24 NOTE — Assessment & Plan Note (Signed)
Controlled with Tylenol prn

## 2017-04-24 NOTE — Assessment & Plan Note (Signed)
Continue Atorvastatin, Fish Oil and Eliquis

## 2017-04-24 NOTE — Assessment & Plan Note (Signed)
Compensated Continue Lisinopril, Metoprolol and Lasix

## 2017-04-24 NOTE — Progress Notes (Signed)
Subjective:    Patient ID: Donna Rosario, female    DOB: 11-22-1922, 83 y.o.   MRN: [id number]  HPI  Resident seen in [address], for routine follow up Reviewed with Luellen Pucker, RN. She reports wound to LLE looks much better. She has an appt with a vascular surgeon the first week of November. Resident concerned that wound has not yet healed. Otherwise, no new complaints. She is very active, walks with rolling walker.  Independent with ADL's. Appetite good. Weight has been stable. Denies pain, reflux of SOB.  Osteoarthritis: She denies any joint pain at this time. She takes Tylenol as needed with good relief.   Afib: Controlled on Metoprolol and Eliquis.  CHF: Compensated. Controlled with Lisinopril, Metoprolol and Lasix.  Chronic Low Back Pain: She has some mild stiffness in her back. She takes Tylenol as needed with good relief.   History of Stroke: No residual functional effect. She is taking Atorvastatin, Fish Oil and Eliquis as prescribed.   HLD: No issues on Atorvastatin.  HTN: Her BP has been well controlled on Lisinopril and Lopressor.  Osteoporosis: No recent falls. She is taking Fosamax, Calcium and Vit D as prescribed.  Hyothyroid: No issues on her current dose of Armour Thyroid.  Vascular Dementia: Mild cognitive needs. She is taking Eliquis. Needs ALF.  Review of Systems      Past Medical History:  Diagnosis Date  . Arthritis   . Atrial fibrillation [facility]) Sept. 2011   [facility] Admission for RVR  . Chronic low back pain    due to lumbosacral degenerative joint disease  . Disturbance of skin sensation   . Embolic stroke ([place]) 98/3382  . Hyperlipidemia   . Hypertension   . Mitral valve disorders(424.0)   . Osteoporosis   . Sciatica   . Thyroid disease    Hypothyroidism  . Vascular dementia, uncomplicated     Current Outpatient Prescriptions  Medication Sig Dispense Refill  . alendronate (FOSAMAX) 70 MG tablet Take 1 tablet (70 mg total) by mouth once a week.  Take with a full glass of water on an empty stomach. 12 tablet 0  . apixaban (ELIQUIS) 5 MG TABS tablet Take 1 tablet (5 mg total) by mouth 2 (two) times daily. 60 tablet 11  . ARMOUR THYROID 60 MG tablet TAKE 1 TABLET (60 MG TOTAL) BY MOUTH DAILY. 90 tablet 3  . Ascorbic Acid (VITAMIN C) 500 MG tablet Take 500 mg by mouth daily.      Marland Kitchen atorvastatin (LIPITOR) 40 MG tablet Take 1 tablet (40 mg total) by mouth daily at 6 PM. 30 tablet 2  . calcium carbonate (TUMS - DOSED IN MG ELEMENTAL CALCIUM) 500 MG chewable tablet Chew 2 tablets by mouth daily.    . Calcium Carbonate-Vit D-Min (CALCIUM 1200 PO) Take 1 capsule by mouth daily.      . Cholecalciferol (VITAMIN D3) 10000 units TABS Take 50,000 Units by mouth every 30 (thirty) days.    . fish oil-omega-3 fatty acids 1000 MG capsule Take 1 g by mouth daily.      . furosemide (LASIX) 40 MG tablet Take 40 mg by mouth.    . L-Lysine 500 MG TABS Take 1 tablet by mouth daily.      Marland Kitchen lisinopril (PRINIVIL,ZESTRIL) 40 MG tablet Take 1 tablet (40 mg total) by mouth daily. 90 tablet 3  . metoprolol tartrate (LOPRESSOR) 25 MG tablet Take 1 tablet (25 mg total) by mouth 2 (two) times daily. 180 tablet 3  .  Selenium 200 MCG TABS Take 1 tablet by mouth daily.       No current facility-administered medications for this visit.     Allergies  Allergen Reactions  . Sulfonamide Derivatives     Family History  Problem Relation Age of Onset  . Other Mother        CHF  . Osteoporosis Mother   . Diabetes Mother   . Other Father        unknown  . Osteoporosis Father   . Heart disease Father     Social History   Social History  . Marital status: Widowed    Spouse name: N/A  . Number of children: 2  . Years of education: N/A   Occupational History  . Professional artist--taught art    Social History Main Topics  . Smoking status: Never Smoker  . Smokeless tobacco: Never Used  . Alcohol use 3.0 oz/week    5 Glasses of wine per week     Comment:  occasional  . Drug use: No  . Sexual activity: No   Other Topics Concern  . Not on file   Social History Narrative   Has living will   Son or daughter should make health care decisions   Will accept resuscitation     Constitutional: Denies fever, malaise, fatigue, headache or abrupt weight changes.  HEENT: Pt reports blurry vision. Denies eye pain, eye redness, ear pain, ringing in the ears, wax buildup, runny nose, nasal congestion, bloody nose, or sore throat. Respiratory: Denies difficulty breathing, shortness of breath, cough or sputum production.   Cardiovascular: Denies chest pain, chest tightness, palpitations or swelling in the hands or feet.  Gastrointestinal: Pt reports intermittent constipation. Denies abdominal pain, bloating, diarrhea or blood in the stool.  GU: Denies urgency, frequency, pain with urination, burning sensation, blood in urine, odor or discharge. Musculoskeletal: Pt reports intermittent low back pain. Denies decrease in range of motion, difficulty with gait, muscle pain or joint swelling.  Skin: Pt reports nonhealing wound to RLE.  Neurological: Pt reports mild difficulty with memory. Denies dizziness, difficulty with speech or problems with balance and coordination.  Psych: Denies anxiety, depression, SI/HI.  No other specific complaints in a complete review of systems (except as listed in HPI above).  Objective:   Physical Exam  BP 124/80   Pulse 66   Temp 97.8 F (36.6 C)   Resp 16  Wt Readings from Last 3 Encounters:  03/25/17 125 lb (56.7 kg)  03/21/17 126 lb (57.2 kg)  02/06/17 128 lb (58.1 kg)    General: Appears her stated age, in NAD. Skin: Wound to RLE not assessed as nurse had just redressed it. Cardiovascular: Normal rate and rhythm. S1,S2 noted.  No murmur, rubs or gallops noted. Pulmonary/Chest: Normal effort and positive vesicular breath sounds. No respiratory distress. No wheezes, rales or ronchi noted.  Abdomen: Soft and  nontender. Normal bowel sounds. No distention or masses noted. Liver, spleen and kidneys non palpable. Musculoskeletal: Gait slow but steady with use of rolling walker. Neurological: Alert and oriented. Decreased short term memory noted.   BMET    Component Value Date/Time   NA 135 08/07/2016 0452   NA 141 02/24/2015 1041   NA 142 06/08/2014 1940   K 4.0 08/07/2016 0452   K 3.9 06/08/2014 1940   CL 107 08/07/2016 0452   CL 110 (H) 06/08/2014 1940   CO2 23 08/07/2016 0452   CO2 26 06/08/2014 1940   GLUCOSE 105 (  H) 08/07/2016 0452   GLUCOSE 105 (H) 06/08/2014 1940   BUN 22 (H) 08/07/2016 0452   BUN 23 02/24/2015 1041   BUN 22 (H) 06/08/2014 1940   CREATININE 0.75 08/07/2016 0452   CREATININE 0.95 06/08/2014 1940   CREATININE 0.95 01/30/2013 1628   CALCIUM 8.5 (L) 08/07/2016 0452   CALCIUM 9.0 06/08/2014 1940   GFRNONAA >60 08/07/2016 0452   GFRNONAA 59 (L) 06/08/2014 1940   GFRAA >60 08/07/2016 0452   GFRAA >60 06/08/2014 1940    Lipid Panel     Component Value Date/Time   CHOL 215 (H) 01/12/2014 1400   TRIG 165.0 (H) 01/12/2014 1400   HDL 54.70 01/12/2014 1400   CHOLHDL 4 01/12/2014 1400   VLDL 33.0 01/12/2014 1400   LDLCALC 127 (H) 01/12/2014 1400    CBC    Component Value Date/Time   WBC 10.9 08/05/2016 0404   RBC 4.81 08/05/2016 0404   HGB 14.2 08/05/2016 0404   HGB 14.0 06/08/2014 1940   HCT 42.9 08/05/2016 0404   HCT 43.1 06/08/2014 1940   PLT 208 08/05/2016 0404   PLT 235 06/08/2014 1940   MCV 89.3 08/05/2016 0404   MCV 90 06/08/2014 1940   MCH 29.5 08/05/2016 0404   MCHC 33.1 08/05/2016 0404   RDW 15.0 (H) 08/05/2016 0404   RDW 14.4 06/08/2014 1940   LYMPHSABS 1.6 08/04/2016 1104   MONOABS 1.0 (H) 08/04/2016 1104   EOSABS 0.0 08/04/2016 1104   BASOSABS 0.1 08/04/2016 1104    Hgb A1C No results found for: HGBA1C          Assessment & Plan:

## 2017-04-24 NOTE — Assessment & Plan Note (Signed)
Mild Continue Eliquis for now ALF appropriate

## 2017-04-24 NOTE — Assessment & Plan Note (Signed)
She will plan to follow up with the vascular surgeon next week

## 2017-04-24 NOTE — Assessment & Plan Note (Signed)
Will monitor yearly Free T4 Continue Armour Thyroid for now

## 2017-04-24 NOTE — Assessment & Plan Note (Signed)
Continue Metoprolol and Eliquis 

## 2017-04-24 NOTE — Assessment & Plan Note (Signed)
Controlled on Lisinopril and Metoprolol Will monitor

## 2017-04-24 NOTE — Assessment & Plan Note (Signed)
Continue Fosamax, Calcium and Vit D

## 2017-05-02 ENCOUNTER — Other Ambulatory Visit (INDEPENDENT_AMBULATORY_CARE_PROVIDER_SITE_OTHER): Payer: Medicare Other | Admitting: Vascular Surgery

## 2017-05-06 ENCOUNTER — Encounter (INDEPENDENT_AMBULATORY_CARE_PROVIDER_SITE_OTHER): Payer: Medicare Other

## 2017-05-23 ENCOUNTER — Ambulatory Visit (INDEPENDENT_AMBULATORY_CARE_PROVIDER_SITE_OTHER): Payer: Medicare Other | Admitting: Vascular Surgery

## 2017-05-23 ENCOUNTER — Encounter (INDEPENDENT_AMBULATORY_CARE_PROVIDER_SITE_OTHER): Payer: Self-pay | Admitting: Vascular Surgery

## 2017-05-23 ENCOUNTER — Encounter (INDEPENDENT_AMBULATORY_CARE_PROVIDER_SITE_OTHER): Payer: Self-pay

## 2017-05-23 VITALS — BP 139/60 | HR 67 | Resp 16 | Wt 128.0 lb

## 2017-05-23 DIAGNOSIS — I83213 Varicose veins of right lower extremity with both ulcer of ankle and inflammation: Secondary | ICD-10-CM

## 2017-05-23 DIAGNOSIS — I872 Venous insufficiency (chronic) (peripheral): Secondary | ICD-10-CM

## 2017-05-23 DIAGNOSIS — L97312 Non-pressure chronic ulcer of right ankle with fat layer exposed: Secondary | ICD-10-CM

## 2017-05-23 DIAGNOSIS — L97309 Non-pressure chronic ulcer of unspecified ankle with unspecified severity: Secondary | ICD-10-CM

## 2017-05-23 DIAGNOSIS — I83203 Varicose veins of unspecified lower extremity with both ulcer of ankle and inflammation: Secondary | ICD-10-CM | POA: Insufficient documentation

## 2017-05-23 NOTE — Progress Notes (Signed)
    MRN : [id number]  Donna Rosario is a 83 y.o. (05-Mar-1923) female who presents with chief complaint of No chief complaint on file. .  The patient's right lower extremity was sterilely prepped and draped.  The ultrasound machine was used to visualize the right great  saphenous vein throughout its course.  A segment at the knee was selected for access.  The saphenous vein was accessed without difficulty using ultrasound guidance with a micropuncture needle.   An 0.018  wire was placed beyond the saphenofemoral junction through the sheath and the microneedle was removed.  The 65 cm sheath was then placed over the wire and the wire and dilator were removed.  The laser fiber was placed through the sheath and its tip was placed approximately 2 cm below the saphenofemoral junction.  Tumescent anesthesia was then created with a dilute lidocaine solution.  Laser energy was then delivered with constant withdrawal of the sheath and laser fiber.  Approximately 950 Joules of energy were delivered over a length of 26 cm.   The patient's leg was then repositioned and reprepped and redraped in a sterile fashion. The small saphenous vein was then evaluated with ultrasound.   A segment of the small saphenous vein at the distal gastroc was selected for access.  The saphenous vein was accessed without difficulty using ultrasound guidance with a micropuncture needle.   An 0.018  wire was placed beyond the saphenopopliteal junction through the sheath and the microneedle was removed.  The 65 cm sheath was then placed over the wire and the wire and dilator were removed.  The laser fiber was placed through the sheath and its tip was placed approximately 2 cm below the saphenopopliteal junction.  Tumescent anesthesia was then created with a dilute lidocaine solution.  Laser energy was then delivered with constant withdrawal of the sheath and laser fiber.  Approximately 540 Joules of energy were delivered over a length of 16 cm.     Sterile dressings were placed.  The patient tolerated the procedure well without complications.

## 2017-05-24 ENCOUNTER — Encounter (INDEPENDENT_AMBULATORY_CARE_PROVIDER_SITE_OTHER): Payer: Self-pay | Admitting: Vascular Surgery

## 2017-05-27 ENCOUNTER — Other Ambulatory Visit (INDEPENDENT_AMBULATORY_CARE_PROVIDER_SITE_OTHER): Payer: Self-pay | Admitting: Vascular Surgery

## 2017-05-27 ENCOUNTER — Ambulatory Visit (INDEPENDENT_AMBULATORY_CARE_PROVIDER_SITE_OTHER): Payer: Medicare Other

## 2017-05-27 DIAGNOSIS — I872 Venous insufficiency (chronic) (peripheral): Secondary | ICD-10-CM

## 2017-06-06 ENCOUNTER — Ambulatory Visit: Payer: Medicare Other

## 2017-07-25 ENCOUNTER — Ambulatory Visit: Payer: Medicare Other | Admitting: Internal Medicine

## 2017-07-25 ENCOUNTER — Encounter: Payer: Self-pay | Admitting: Internal Medicine

## 2017-07-25 VITALS — BP 160/72 | HR 58 | Temp 97.4°F | Resp 24 | Wt 133.3 lb

## 2017-07-25 DIAGNOSIS — I482 Chronic atrial fibrillation, unspecified: Secondary | ICD-10-CM

## 2017-07-25 DIAGNOSIS — I10 Essential (primary) hypertension: Secondary | ICD-10-CM

## 2017-07-25 DIAGNOSIS — I5032 Chronic diastolic (congestive) heart failure: Secondary | ICD-10-CM

## 2017-07-25 DIAGNOSIS — I83013 Varicose veins of right lower extremity with ulcer of ankle: Secondary | ICD-10-CM | POA: Diagnosis not present

## 2017-07-25 DIAGNOSIS — F0150 Vascular dementia without behavioral disturbance: Secondary | ICD-10-CM | POA: Diagnosis not present

## 2017-07-25 DIAGNOSIS — E039 Hypothyroidism, unspecified: Secondary | ICD-10-CM

## 2017-07-25 DIAGNOSIS — L97319 Non-pressure chronic ulcer of right ankle with unspecified severity: Secondary | ICD-10-CM | POA: Diagnosis not present

## 2017-07-25 NOTE — Assessment & Plan Note (Signed)
Edema better after saphenous vein ablation Chronic ulcer that looks dry and stable

## 2017-07-25 NOTE — Assessment & Plan Note (Signed)
Mild ---apparently vascular --- and no clear worsening in functional status

## 2017-07-25 NOTE — Progress Notes (Signed)
Subjective:    Patient ID: Donna Rosario, female    DOB: 05-15-1923, 84 y.o.   MRN: [id number]  HPI Visit at [facility] assisted living for review of chronic health conditions Reviewed status with Luellen Pucker RN Has had 2 recent falls--out of bed. Rail placed on side of bed for her to hold onto when transferring  Had chronic right calf ulcer Wound care and then vascular surgeon did vein ablation Still with ulcer and stable granulation on right lateral malleolus Very little pain Edema is some better  Has aide at times now--but seems to be maintaining some indendence Wears pad for urinary incontinence  No chest pain No SOB No palpitations No dizziness  Mild memory issues are stable No functional   Current Outpatient Medications on File Prior to Visit  Medication Sig Dispense Refill  . alendronate (FOSAMAX) 70 MG tablet Take 1 tablet (70 mg total) by mouth once a week. Take with a full glass of water on an empty stomach. 12 tablet 0  . apixaban (ELIQUIS) 5 MG TABS tablet Take 1 tablet (5 mg total) by mouth 2 (two) times daily. 60 tablet 11  . ARMOUR THYROID 60 MG tablet TAKE 1 TABLET (60 MG TOTAL) BY MOUTH DAILY. 90 tablet 3  . Ascorbic Acid (VITAMIN C) 500 MG tablet Take 500 mg by mouth daily.      Marland Kitchen atorvastatin (LIPITOR) 40 MG tablet Take 1 tablet (40 mg total) by mouth daily at 6 PM. 30 tablet 2  . calcium carbonate (TUMS - DOSED IN MG ELEMENTAL CALCIUM) 500 MG chewable tablet Chew 2 tablets by mouth daily.    . Calcium Carbonate-Vit D-Min (CALCIUM 1200 PO) Take 1 capsule by mouth daily.      . Cholecalciferol (VITAMIN D3) 10000 units TABS Take 50,000 Units by mouth every 30 (thirty) days.    . fish oil-omega-3 fatty acids 1000 MG capsule Take 1 g by mouth daily.      . furosemide (LASIX) 40 MG tablet Take 40 mg by mouth.    . L-Lysine 500 MG TABS Take 1 tablet by mouth daily.      . Selenium 200 MCG TABS Take 1 tablet by mouth daily.      Marland Kitchen lisinopril (PRINIVIL,ZESTRIL) 40 MG  tablet Take 1 tablet (40 mg total) by mouth daily. 90 tablet 3  . metoprolol tartrate (LOPRESSOR) 25 MG tablet Take 1 tablet (25 mg total) by mouth 2 (two) times daily. 180 tablet 3   No current facility-administered medications on file prior to visit.     Allergies  Allergen Reactions  . Sulfonamide Derivatives     Past Medical History:  Diagnosis Date  . Arthritis   . Atrial fibrillation [facility]) Sept. 2011   [facility] Admission for RVR  . Chronic low back pain    due to lumbosacral degenerative joint disease  . Disturbance of skin sensation   . Embolic stroke ([place]) 02/3817  . Hyperlipidemia   . Hypertension   . Mitral valve disorders(424.0)   . Osteoporosis   . Sciatica   . Thyroid disease    Hypothyroidism  . Vascular dementia, uncomplicated     Past Surgical History:  Procedure Laterality Date  . JOINT REPLACEMENT     Right Hip Oct 2011,  Left Knee 2008 ([place])  . KNEE SURGERY     Left knee  . THYROIDECTOMY    . TOTAL HIP ARTHROPLASTY  2011    Family History  Problem Relation Age of Onset  .  Other Mother        CHF  . Osteoporosis Mother   . Diabetes Mother   . Other Father        unknown  . Osteoporosis Father   . Heart disease Father     Social History   Socioeconomic History  . Marital status: Widowed    Spouse name: Not on file  . Number of children: 2  . Years of education: Not on file  . Highest education level: Not on file  Social Needs  . Financial resource strain: Not on file  . Food insecurity - worry: Not on file  . Food insecurity - inability: Not on file  . Transportation needs - medical: Not on file  . Transportation needs - non-medical: Not on file  Occupational History  . Occupation: Youth worker  Tobacco Use  . Smoking status: Never Smoker  . Smokeless tobacco: Never Used  Substance and Sexual Activity  . Alcohol use: Yes    Alcohol/week: 3.0 oz    Types: 5 Glasses of wine per week    Comment: occasional  .  Drug use: No  . Sexual activity: No  Other Topics Concern  . Not on file  Social History Narrative   Has living will   Son or daughter should make health care decisions   Will accept resuscitation   Review of Systems Still has stiffness in hips---walks with cane mostly---occasionally walker Exercises most days--- with Legrand [place] and on her own Appetite is good Weight up slightly Sleeping well Bowels are fine    Objective:   Physical Exam  Constitutional: She appears well-developed. No distress.  Neck: No thyromegaly present.  Cardiovascular: Normal rate and normal heart sounds. Exam reveals no gallop.  No murmur heard. irregular  Pulmonary/Chest: Breath sounds normal. No respiratory distress. She has no wheezes. She has no rales.  Abdominal: Soft. There is no tenderness.  Musculoskeletal:  No sig edema Support hose on right  Lymphadenopathy:    She has no cervical adenopathy.  Skin:  Stasis changes R>L Small granulated ulcer on right lateral malleolus--no inflammation          Assessment & Plan:

## 2017-07-25 NOTE — Assessment & Plan Note (Signed)
Rate control good On eliquis Will set up labs soon

## 2017-07-25 NOTE — Assessment & Plan Note (Signed)
Compensated. No changes needed. 

## 2017-07-25 NOTE — Assessment & Plan Note (Signed)
BP Readings from Last 3 Encounters:  07/25/17 (!) 160/72  05/23/17 139/60  04/24/17 124/80   Up today but generally okay No change for now but staff will monitor

## 2017-07-25 NOTE — Assessment & Plan Note (Signed)
Will check labs soon

## 2017-10-18 ENCOUNTER — Ambulatory Visit: Payer: Medicare Other | Admitting: Internal Medicine

## 2017-10-18 VITALS — BP 141/78 | HR 70 | Temp 98.0°F | Resp 20 | Wt 131.0 lb

## 2017-10-18 DIAGNOSIS — F0150 Vascular dementia without behavioral disturbance: Secondary | ICD-10-CM

## 2017-10-18 DIAGNOSIS — I482 Chronic atrial fibrillation, unspecified: Secondary | ICD-10-CM

## 2017-10-18 DIAGNOSIS — I5032 Chronic diastolic (congestive) heart failure: Secondary | ICD-10-CM | POA: Diagnosis not present

## 2017-10-18 DIAGNOSIS — M47816 Spondylosis without myelopathy or radiculopathy, lumbar region: Secondary | ICD-10-CM | POA: Diagnosis not present

## 2017-10-18 DIAGNOSIS — E039 Hypothyroidism, unspecified: Secondary | ICD-10-CM

## 2017-10-18 DIAGNOSIS — I10 Essential (primary) hypertension: Secondary | ICD-10-CM | POA: Diagnosis not present

## 2017-10-18 DIAGNOSIS — M810 Age-related osteoporosis without current pathological fracture: Secondary | ICD-10-CM | POA: Diagnosis not present

## 2017-10-18 DIAGNOSIS — E782 Mixed hyperlipidemia: Secondary | ICD-10-CM

## 2017-10-18 DIAGNOSIS — S91001A Unspecified open wound, right ankle, initial encounter: Secondary | ICD-10-CM

## 2017-10-18 DIAGNOSIS — Z8673 Personal history of transient ischemic attack (TIA), and cerebral infarction without residual deficits: Secondary | ICD-10-CM | POA: Diagnosis not present

## 2017-10-25 ENCOUNTER — Emergency Department
Admission: EM | Admit: 2017-10-25 | Discharge: 2017-10-25 | Disposition: A | Discharge status: Home / Self Care | Payer: Medicare Other | Attending: Emergency Medicine | Admitting: Emergency Medicine

## 2017-10-25 ENCOUNTER — Emergency Department: Payer: Medicare Other

## 2017-10-25 ENCOUNTER — Other Ambulatory Visit: Payer: Self-pay

## 2017-10-25 DIAGNOSIS — Y92122 Bedroom in nursing home as the place of occurrence of the external cause: Secondary | ICD-10-CM | POA: Diagnosis not present

## 2017-10-25 DIAGNOSIS — Y999 Unspecified external cause status: Secondary | ICD-10-CM | POA: Insufficient documentation

## 2017-10-25 DIAGNOSIS — Z96641 Presence of right artificial hip joint: Secondary | ICD-10-CM | POA: Insufficient documentation

## 2017-10-25 DIAGNOSIS — S0993XA Unspecified injury of face, initial encounter: Secondary | ICD-10-CM | POA: Diagnosis present

## 2017-10-25 DIAGNOSIS — S0083XA Contusion of other part of head, initial encounter: Secondary | ICD-10-CM | POA: Diagnosis not present

## 2017-10-25 DIAGNOSIS — E039 Hypothyroidism, unspecified: Secondary | ICD-10-CM | POA: Insufficient documentation

## 2017-10-25 DIAGNOSIS — W19XXXA Unspecified fall, initial encounter: Secondary | ICD-10-CM

## 2017-10-25 DIAGNOSIS — Y939 Activity, unspecified: Secondary | ICD-10-CM | POA: Insufficient documentation

## 2017-10-25 DIAGNOSIS — S60212A Contusion of left wrist, initial encounter: Secondary | ICD-10-CM | POA: Diagnosis not present

## 2017-10-25 DIAGNOSIS — Z96652 Presence of left artificial knee joint: Secondary | ICD-10-CM | POA: Insufficient documentation

## 2017-10-25 DIAGNOSIS — H05231 Hemorrhage of right orbit: Secondary | ICD-10-CM

## 2017-10-25 DIAGNOSIS — I5032 Chronic diastolic (congestive) heart failure: Secondary | ICD-10-CM | POA: Insufficient documentation

## 2017-10-25 DIAGNOSIS — S5012XA Contusion of left forearm, initial encounter: Secondary | ICD-10-CM

## 2017-10-25 DIAGNOSIS — Z79899 Other long term (current) drug therapy: Secondary | ICD-10-CM | POA: Insufficient documentation

## 2017-10-25 DIAGNOSIS — I110 Hypertensive heart disease with heart failure: Secondary | ICD-10-CM | POA: Diagnosis not present

## 2017-10-25 DIAGNOSIS — F0390 Unspecified dementia without behavioral disturbance: Secondary | ICD-10-CM | POA: Diagnosis not present

## 2017-10-25 DIAGNOSIS — R51 Headache: Secondary | ICD-10-CM | POA: Insufficient documentation

## 2017-10-25 DIAGNOSIS — Z7901 Long term (current) use of anticoagulants: Secondary | ICD-10-CM | POA: Insufficient documentation

## 2017-10-25 LAB — URINALYSIS, COMPLETE (UACMP) WITH MICROSCOPIC
BACTERIA UA: NONE SEEN
Bilirubin Urine: NEGATIVE
GLUCOSE, UA: NEGATIVE mg/dL
HGB URINE DIPSTICK: NEGATIVE
Ketones, ur: 5 mg/dL — AB
Leukocytes, UA: NEGATIVE
NITRITE: NEGATIVE
PROTEIN: NEGATIVE mg/dL
SPECIFIC GRAVITY, URINE: 1.018 (ref 1.005–1.030)
pH: 6 (ref 5.0–8.0)

## 2017-10-25 LAB — BASIC METABOLIC PANEL
ANION GAP: 11 (ref 5–15)
BUN: 23 mg/dL — ABNORMAL HIGH (ref 6–20)
CHLORIDE: 102 mmol/L (ref 101–111)
CO2: 23 mmol/L (ref 22–32)
Calcium: 8.6 mg/dL — ABNORMAL LOW (ref 8.9–10.3)
Creatinine, Ser: 0.75 mg/dL (ref 0.44–1.00)
GFR calc non Af Amer: >60 mL/min (ref >60)
GLUCOSE: 115 mg/dL — AB (ref 65–99)
Potassium: 4.2 mmol/L (ref 3.5–5.1)
Sodium: 136 mmol/L (ref 135–145)

## 2017-10-25 LAB — CBC
HCT: 44.4 % (ref 35.0–47.0)
HEMOGLOBIN: 14.7 g/dL (ref 12.0–16.0)
MCH: 28.4 pg (ref 26.0–34.0)
MCHC: 33.1 g/dL (ref 32.0–36.0)
MCV: 85.7 fL (ref 80.0–100.0)
Platelets: 246 10*3/uL (ref 150–440)
RBC: 5.18 MIL/uL (ref 3.80–5.20)
RDW: 16 % — AB (ref 11.5–14.5)
WBC: 9.9 10*3/uL (ref 3.6–11.0)

## 2017-10-25 MED ORDER — LISINOPRIL 10 MG PO TABS
ORAL_TABLET | ORAL | Status: AC
  Filled 2017-10-25: qty 1

## 2017-10-25 MED ORDER — METOPROLOL TARTRATE 25 MG PO TABS
25.0000 mg | ORAL_TABLET | Freq: Once | ORAL | Status: AC
  Administered 2017-10-25: 25 mg via ORAL
  Filled 2017-10-25: qty 1

## 2017-10-25 MED ORDER — ACETAMINOPHEN 500 MG PO TABS
1000.0000 mg | ORAL_TABLET | Freq: Once | ORAL | Status: AC
  Administered 2017-10-25: 1000 mg via ORAL
  Filled 2017-10-25: qty 2

## 2017-10-25 MED ORDER — OXYCODONE HCL 5 MG PO TABS
5.0000 mg | ORAL_TABLET | Freq: Once | ORAL | Status: AC
  Administered 2017-10-25: 5 mg via ORAL
  Filled 2017-10-25: qty 1

## 2017-10-25 MED ORDER — LISINOPRIL 10 MG PO TABS
40.0000 mg | ORAL_TABLET | Freq: Once | ORAL | Status: AC
  Administered 2017-10-25: 40 mg via ORAL
  Filled 2017-10-25: qty 4

## 2017-10-25 NOTE — ED Notes (Addendum)
Pt resting in bed, states her right eye is hurting, her left arm hurts when she moves it, md aware, order obtained. Sitter at bedside from [facility].

## 2017-10-25 NOTE — ED Notes (Signed)
Caregiver from [facility] at the bedside. Call bell in reach. Will continue to assess.

## 2017-10-25 NOTE — ED Provider Notes (Signed)
[facility] Emergency Department Provider Note  ____________________________________________  Time seen: Approximately 10:53 AM  I have reviewed the triage vital signs and the nursing notes.   HISTORY  Chief Complaint Fall  Level 5 caveat:  Portions of the history and physical were unable to be obtained due to dementia   HPI Donna Rosario is a 84 y.o. female with a history of dementia, hypertension, hyperlipidemia, CVA, atrial fibrillation on Eliquis who presents for evaluation of facial trauma status post fall.  Patient had a unwitnessed fall last night.  She reports a hitting her face onto the floor.  She has a large right periorbital hematoma.  She denies changes in vision in her right eye.  She is complaining of mild constant pain that is located in the right side of her head since the fall.  She denies severe headache, neck pain, back pain, chest pain, abdominal pain, extremity pain.  Patient reports that she thinks she rolled out of bed but she is not sure how she fell.  She was found to have a big bruise this morning and sent here for evaluation.  Past Medical History:  Diagnosis Date  . Arthritis   . Atrial fibrillation [facility]) Sept. 2011   [facility] Admission for RVR  . Chronic low back pain    due to lumbosacral degenerative joint disease  . Disturbance of skin sensation   . Embolic stroke ([place]) 02/3817  . Hyperlipidemia   . Hypertension   . Mitral valve disorders(424.0)   . Osteoporosis   . Sciatica   . Thyroid disease    Hypothyroidism  . Vascular dementia, uncomplicated     Patient Active Problem List   Diagnosis Date Noted  . 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 Osteoarthritis 04/24/2017  . History of stroke 04/24/2017  . HLD (hyperlipidemia) 04/24/2017  . Osteoporosis 04/24/2017  . Venous ulcer of ankle, right ([place]) 03/21/2017  . Hypothyroidism 05/30/2015  . Chronic diastolic CHF  (congestive heart failure) ([place]) 08/10/2013  . Chronic venous insufficiency 03/11/2011  . Dementia 03/09/2011  . HYPERTENSION, BENIGN 03/30/2010  . Chronic atrial fibrillation ([place]) 03/30/2010    Past Surgical History:  Procedure Laterality Date  . JOINT REPLACEMENT     Right Hip Oct 2011,  Left Knee 2008 ([place])  . KNEE SURGERY     Left knee  . THYROIDECTOMY    . TOTAL HIP ARTHROPLASTY  2011    Prior to Admission medications   Medication Sig Start Date End Date Taking? Authorizing Provider  alendronate (FOSAMAX) 70 MG tablet Take 1 tablet (70 mg total) by mouth once a week. Take with a full glass of water on an empty stomach. 07/28/16   Crecencio Mc, MD  apixaban (ELIQUIS) 5 MG TABS tablet Take 1 tablet (5 mg total) by mouth 2 (two) times daily. 01/10/17   Venia Carbon, MD  ARMOUR THYROID 60 MG tablet TAKE 1 TABLET (60 MG TOTAL) BY MOUTH DAILY. 03/09/14   Crecencio Mc, MD  Ascorbic Acid (VITAMIN C) 500 MG tablet Take 500 mg by mouth daily.      [provider]  atorvastatin (LIPITOR) 40 MG tablet Take 1 tablet (40 mg total) by mouth daily at 6 PM. 08/07/16   Gladstone Lighter, MD  calcium carbonate (TUMS - DOSED IN MG ELEMENTAL CALCIUM) 500 MG chewable tablet Chew 2 tablets by mouth daily.    [provider]  Calcium Carbonate-Vit D-Min (  CALCIUM 1200 PO) Take 1 capsule by mouth daily.      [provider]  Cholecalciferol (VITAMIN D3) 10000 units TABS Take 50,000 Units by mouth every 30 (thirty) days.    [provider]  fish oil-omega-3 fatty acids 1000 MG capsule Take 1 g by mouth daily.      [provider]  furosemide (LASIX) 40 MG tablet Take 40 mg by mouth.    [provider]  L-Lysine 500 MG TABS Take 1 tablet by mouth daily.      [provider]  lisinopril (PRINIVIL,ZESTRIL) 40 MG tablet Take 1 tablet (40 mg total) by mouth daily. 03/25/17 06/23/17  Minna Merritts, MD  metoprolol tartrate (LOPRESSOR) 25  MG tablet Take 1 tablet (25 mg total) by mouth 2 (two) times daily. 03/25/17 06/23/17  Minna Merritts, MD  Selenium 200 MCG TABS Take 1 tablet by mouth daily.      [provider]    Allergies Sulfonamide derivatives  Family History  Problem Relation Age of Onset  . Other Mother        CHF  . Osteoporosis Mother   . Diabetes Mother   . Other Father        unknown  . Osteoporosis Father   . Heart disease Father     Social History Social History   Tobacco Use  . Smoking status: Never Smoker  . Smokeless tobacco: Never Used  Substance Use Topics  . Alcohol use: Yes    Alcohol/week: 3.0 oz    Types: 5 Glasses of wine per week    Comment: occasional  . Drug use: No    Review of Systems Constitutional: Negative for fever. Eyes: Negative for visual changes. ENT: + facial injury. No neck injury Cardiovascular: Negative for chest injury. Respiratory: Negative for shortness of breath. Negative for chest wall injury. Gastrointestinal: Negative for abdominal pain or injury. Genitourinary: Negative for dysuria. Musculoskeletal: Negative for back injury, negative for arm or leg pain. Skin: Negative for laceration/abrasions. Neurological: Negative for head injury.   ____________________________________________   PHYSICAL EXAM:  VITAL SIGNS: ED Triage Vitals  Enc Vitals Group     BP 10/25/17 0929 (!) 190/100     Pulse Rate 10/25/17 0925 91     Resp 10/25/17 0925 (!) 21     Temp 10/25/17 0925 98 F (36.7 C)     Temp Source 10/25/17 0925 Oral     SpO2 10/25/17 0929 99 %     Weight 10/25/17 0927 140 lb (63.5 kg)     Height 10/25/17 0927 5\' 3"  (1.6 m)     Head Circumference --      Peak Flow --      Pain Score 10/25/17 0926 0     Pain Loc --      Pain Edu? --      Excl. in [place]? --     Constitutional: Alert and oriented. No acute distress. Does not appear intoxicated. HEENT Head: Normocephalic, R forehead hematoma. Face: No facial bony tenderness. Stable  midface Ears: No hemotympanum bilaterally. No Battle sign Eyes: R periorbital ecchymosis and swelling, R conjunctiva is erythematous, no hyphema, intact EOMI and visual fields b/l. Mild R periorbital ecchymosis Nose: Nontender. No epistaxis. No rhinorrhea Mouth/Throat: Mucous membranes are moist. No oropharyngeal blood. No dental injury. Airway patent without stridor. Normal voice. Neck: no C-collar in place. No midline c-spine tenderness.  Cardiovascular: Normal rate, regular rhythm. Normal and symmetric distal pulses are present in  all extremities. Pulmonary/Chest: Chest wall is stable and nontender to palpation/compression. Normal respiratory effort. Breath sounds are normal. No crepitus.  Abdominal: Soft, nontender, non distended. Musculoskeletal: Large bruise on the L forearm and L wrist.  Patient has full range of motion of the left wrist with no pain at the snuffbox or axial load of the thumb.  Nontender with normal full range of motion in all extremities. No deformities. No thoracic or lumbar midline spinal tenderness. Pelvis is stable. Skin: Skin is warm, dry and intact. No abrasions or contutions. Psychiatric: Speech and behavior are appropriate. Neurological: Normal speech and language. Moves all extremities to command. No gross focal neurologic deficits are appreciated.  Glascow Coma Score: 4 - Opens eyes on own 6 - Follows simple motor commands 5 - Alert and oriented GCS: 15   ____________________________________________   LABS (all labs ordered are listed, but only abnormal results are displayed)  Labs Reviewed  URINALYSIS, COMPLETE (UACMP) WITH MICROSCOPIC - Abnormal; Notable for the following components:      Result Value   Color, Urine YELLOW (*)    APPearance CLEAR (*)    Ketones, ur 5 (*)    All other components within normal limits  CBC - Abnormal; Notable for the following components:   RDW 16.0 (*)    All other components within normal limits  BASIC METABOLIC  PANEL - Abnormal; Notable for the following components:   Glucose, Bld 115 (*)    BUN 23 (*)    Calcium 8.6 (*)    All other components within normal limits   ____________________________________________  EKG  ED ECG REPORT I, Rudene Re, the attending physician, personally viewed and interpreted this ECG.  Atrial fibrillation, rate of 65, normal QRS and QTC, normal axis, occasional PVCs, no ST elevations or depressions.  Unchanged from prior. ____________________________________________  RADIOLOGY  I have personally reviewed the images performed during this visit and I agree with the Radiologist's read.   Interpretation by Radiologist:  Dg Forearm Left  Result Date: 10/25/2017 CLINICAL DATA:  Left forearm and wrist pain.  Fall. EXAM: LEFT FOREARM - 2 VIEW COMPARISON:  Left hand series 08/03/2016 FINDINGS: Arthritic changes in the left wrist. There is chronic deformity in the distal left radius in the region of the radial styloid, likely related to old injury. This has a stable appearance since prior study. No acute fracture, subluxation or dislocation. IMPRESSION: No acute bony abnormality. Electronically Signed   By: Rolm Baptise M.D.   On: 10/25/2017 10:32   Dg Wrist Complete Left  Result Date: 10/25/2017 CLINICAL DATA:  Left forearm pain, fall EXAM: LEFT WRIST - COMPLETE 3+ VIEW COMPARISON:  08/03/2016 FINDINGS: Chronic deformity in the region of the radial styloid, stable since prior study, likely related to old healed fracture. Advanced arthritic changes in the left wrist at the radiocarpal joint and 1st carpometacarpal joint. No acute fracture, subluxation or dislocation. IMPRESSION: No acute bony abnormality. Electronically Signed   By: Rolm Baptise M.D.   On: 10/25/2017 10:33   Ct Head Wo Contrast  Result Date: 10/25/2017 CLINICAL DATA:  Facial hematoma after fall last night. EXAM: CT HEAD WITHOUT CONTRAST CT MAXILLOFACIAL WITHOUT CONTRAST CT CERVICAL SPINE WITHOUT  CONTRAST TECHNIQUE: Multidetector CT imaging of the head, cervical spine, and maxillofacial structures were performed using the standard protocol without intravenous contrast. Multiplanar CT image reconstructions of the cervical spine and maxillofacial structures were also generated. COMPARISON:  CT scan of August 04, 2016. FINDINGS: CT HEAD FINDINGS Brain: Mild  diffuse cortical atrophy is noted. Mild chronic ischemic white matter disease is noted. No mass effect or midline shift is noted. Ventricular size is within normal limits. There is no evidence of mass lesion, hemorrhage or acute infarction. Vascular: No hyperdense vessel or unexpected calcification. Skull: Normal. Negative for fracture or focal lesion. Other: Moderate size right frontal scalp hematoma is noted. CT MAXILLOFACIAL FINDINGS Osseous: Degenerative changes seen involving the right temporomandibular joint. No fracture or dislocation is noted. Orbits: Probable contusion is seen involving the preseptal tissues of the right orbit. No other abnormality seen involving either orbit. Sinuses: Clear. Soft tissues: As previously described, contusion overlying right orbit. CT CERVICAL SPINE FINDINGS Alignment: Normal. Skull base and vertebrae: No acute fracture. No primary bone lesion or focal pathologic process. Soft tissues and spinal canal: No prevertebral fluid or swelling. No visible canal hematoma. Disc levels: Moderate degenerative disc disease is noted at C3-4 and C4-5 with anterior osteophyte formation. Severe degenerative disc disease is noted at C5-6 and C6-7. Upper chest: Negative. Other: Degenerative changes are seen involving posterior facet joints bilaterally. IMPRESSION: Moderate-sized right frontal scalp hematoma is noted which extends over the right orbit. Mild chronic ischemic white matter disease. Mild diffuse cortical atrophy. No acute intracranial abnormality seen. No significant maxillofacial fracture or bony abnormality is noted.  Multilevel degenerative disc disease. No acute abnormality seen in the cervical spine. Electronically Signed   By: Marijo Conception, M.D.   On: 10/25/2017 10:32   Ct Cervical Spine Wo Contrast  Result Date: 10/25/2017 CLINICAL DATA:  Facial hematoma after fall last night. EXAM: CT HEAD WITHOUT CONTRAST CT MAXILLOFACIAL WITHOUT CONTRAST CT CERVICAL SPINE WITHOUT CONTRAST TECHNIQUE: Multidetector CT imaging of the head, cervical spine, and maxillofacial structures were performed using the standard protocol without intravenous contrast. Multiplanar CT image reconstructions of the cervical spine and maxillofacial structures were also generated. COMPARISON:  CT scan of August 04, 2016. FINDINGS: CT HEAD FINDINGS Brain: Mild diffuse cortical atrophy is noted. Mild chronic ischemic white matter disease is noted. No mass effect or midline shift is noted. Ventricular size is within normal limits. There is no evidence of mass lesion, hemorrhage or acute infarction. Vascular: No hyperdense vessel or unexpected calcification. Skull: Normal. Negative for fracture or focal lesion. Other: Moderate size right frontal scalp hematoma is noted. CT MAXILLOFACIAL FINDINGS Osseous: Degenerative changes seen involving the right temporomandibular joint. No fracture or dislocation is noted. Orbits: Probable contusion is seen involving the preseptal tissues of the right orbit. No other abnormality seen involving either orbit. Sinuses: Clear. Soft tissues: As previously described, contusion overlying right orbit. CT CERVICAL SPINE FINDINGS Alignment: Normal. Skull base and vertebrae: No acute fracture. No primary bone lesion or focal pathologic process. Soft tissues and spinal canal: No prevertebral fluid or swelling. No visible canal hematoma. Disc levels: Moderate degenerative disc disease is noted at C3-4 and C4-5 with anterior osteophyte formation. Severe degenerative disc disease is noted at C5-6 and C6-7. Upper chest: Negative.  Other: Degenerative changes are seen involving posterior facet joints bilaterally. IMPRESSION: Moderate-sized right frontal scalp hematoma is noted which extends over the right orbit. Mild chronic ischemic white matter disease. Mild diffuse cortical atrophy. No acute intracranial abnormality seen. No significant maxillofacial fracture or bony abnormality is noted. Multilevel degenerative disc disease. No acute abnormality seen in the cervical spine. Electronically Signed   By: Marijo Conception, M.D.   On: 10/25/2017 10:32   Ct Maxillofacial Wo Contrast  Result Date: 10/25/2017 CLINICAL DATA:  Facial hematoma  after fall last night. EXAM: CT HEAD WITHOUT CONTRAST CT MAXILLOFACIAL WITHOUT CONTRAST CT CERVICAL SPINE WITHOUT CONTRAST TECHNIQUE: Multidetector CT imaging of the head, cervical spine, and maxillofacial structures were performed using the standard protocol without intravenous contrast. Multiplanar CT image reconstructions of the cervical spine and maxillofacial structures were also generated. COMPARISON:  CT scan of August 04, 2016. FINDINGS: CT HEAD FINDINGS Brain: Mild diffuse cortical atrophy is noted. Mild chronic ischemic white matter disease is noted. No mass effect or midline shift is noted. Ventricular size is within normal limits. There is no evidence of mass lesion, hemorrhage or acute infarction. Vascular: No hyperdense vessel or unexpected calcification. Skull: Normal. Negative for fracture or focal lesion. Other: Moderate size right frontal scalp hematoma is noted. CT MAXILLOFACIAL FINDINGS Osseous: Degenerative changes seen involving the right temporomandibular joint. No fracture or dislocation is noted. Orbits: Probable contusion is seen involving the preseptal tissues of the right orbit. No other abnormality seen involving either orbit. Sinuses: Clear. Soft tissues: As previously described, contusion overlying right orbit. CT CERVICAL SPINE FINDINGS Alignment: Normal. Skull base and  vertebrae: No acute fracture. No primary bone lesion or focal pathologic process. Soft tissues and spinal canal: No prevertebral fluid or swelling. No visible canal hematoma. Disc levels: Moderate degenerative disc disease is noted at C3-4 and C4-5 with anterior osteophyte formation. Severe degenerative disc disease is noted at C5-6 and C6-7. Upper chest: Negative. Other: Degenerative changes are seen involving posterior facet joints bilaterally. IMPRESSION: Moderate-sized right frontal scalp hematoma is noted which extends over the right orbit. Mild chronic ischemic white matter disease. Mild diffuse cortical atrophy. No acute intracranial abnormality seen. No significant maxillofacial fracture or bony abnormality is noted. Multilevel degenerative disc disease. No acute abnormality seen in the cervical spine. Electronically Signed   By: Marijo Conception, M.D.   On: 10/25/2017 10:32     ____________________________________________   PROCEDURES  Procedure(s) performed: None Procedures Critical Care performed:  None ____________________________________________   INITIAL IMPRESSION / ASSESSMENT AND PLAN / ED COURSE  84 y.o. female with a history of dementia, hypertension, hyperlipidemia, CVA, atrial fibrillation on Eliquis who presents for evaluation of facial trauma status post fall.  Due to history of dementia patient is unable to provide details about her fall in the fall was unwitnessed, therefore labs and urine and EKG have been ordered to rule out dehydration, anemia, UTI, or arrhythmia as possible causes of patient's fall. CT head/cspine/face pending. Will give tylenol for pain. No evidence of globe injury or traumatic hyphema.    _________________________ 1:12 PM on 10/25/2017 ----------------------------------------- EKG showing A. fib with well-controlled rate which is chronic for patient.  Urinalysis with no evidence of UTI.  Labs with no acute findings.  Patient remains with no  proptosis, intact visual fields, intact visual acuity.  CT head, C-spine, and face showing right forehead hematoma but no other acute findings.  XRs negative for fracture dislocation.  Discussed ice, pain control, and follow-up with primary care doctor.  Discussed immediate return to the emergency room for proptosis, visual changes or visual loss, or severe headache.    As part of my medical decision making, I reviewed the following data within the [facility] notes reviewed and incorporated, Labs reviewed , EKG interpreted , Old chart reviewed, Radiograph reviewed , Notes from prior ED visits and  Controlled Substance Database    Pertinent labs & imaging results that were available during my care of the patient were reviewed by me and  considered in my medical decision making (see chart for details).    ____________________________________________   FINAL CLINICAL IMPRESSION(S) / ED DIAGNOSES  Final diagnoses:  Fall, initial encounter  Traumatic hematoma of forehead, initial encounter  Periorbital hematoma of right eye  Blunt trauma of face, initial encounter  Traumatic ecchymosis of forearm, left, initial encounter      NEW MEDICATIONS STARTED DURING THIS VISIT:  ED Discharge Orders    None       Note:  This document was prepared using Dragon voice recognition software and may include unintentional dictation errors.    Alfred Levins, [place], MD 10/25/17 1315

## 2017-10-25 NOTE — Discharge Instructions (Addendum)
Your CT scans did not show any evidence of injury other than the hematoma on your face.  If you develop changes in vision, visual loss, severe headache, dizziness, or pressure behind her eye you need to return to the emergency room immediately for further evaluation.  Otherwise follow-up with your primary care doctor.

## 2017-10-25 NOTE — ED Triage Notes (Signed)
She arrives today from [place] experienced a fall during the night  Hematoma with edema covering her right eye  Scattered ecchymosis on her arm  Pt does not remember falling  History of mild dementia, Afib  She takes eliquis everyday

## 2017-10-25 NOTE — ED Notes (Signed)
Walked pt to [facility] with one assist, pt very unsteady on her feet, friend at bedside states she walks with walker at home. Yellow arm band placed.

## 2017-10-25 NOTE — ED Notes (Signed)
Pain reassessed at 1300

## 2017-10-28 ENCOUNTER — Encounter: Payer: Self-pay | Admitting: Internal Medicine

## 2017-10-28 NOTE — Assessment & Plan Note (Signed)
Stable on Metoprolol and Eliquis Monitor

## 2017-10-28 NOTE — Progress Notes (Signed)
Subjective:    Patient ID: Donna Rosario, female    DOB: 12-Nov-1922, 84 y.o.   MRN: [id number]  HPI  Routine follow up for resident in [address]. No new concerns from RN. Family concerned about intermittent nose bleeds, bleeding easily controlled. ? From Flonase vs Eliquis. Resident reports she is sleeping well. Walks with walker, no recent falls. Her appetite is good. Weight is stable. She denies issues with bowel or bladder. She denies reflux, chest pain or shortness of breath. She denies c/o pain at this time.  CHF: Compensated. Continue Lasix, Metoprolol and Lisinopril.  Hypothyroidism: She denies any issues on her current dose of Armour Thyroid.  HLD: She denies myalgias on Atorvastatin.  OA: Mainly in her back. She takes Tylenol as needed with good relief.   Osteoperosis: She is taking Fosamax as prescribed. She gets weight bearing activity daily.  HTN: Reasonable control on Metoprolol and Lisinopril.  Hx of Stroke/Vascular Dementia: Residual effect cognitively. Appropriate for ALF. Continue Metoprolol, Lisinopril, Eliquis and Atorvastatin as prescribed.  Afib: No issues on Metoprolol and Eliquis.   Review of Systems  Past Medical History:  Diagnosis Date  . Arthritis   . Atrial fibrillation [facility]) Sept. 2011   [facility] Admission for RVR  . Chronic low back pain    due to lumbosacral degenerative joint disease  . Disturbance of skin sensation   . Embolic stroke ([place]) 70/3500  . Hyperlipidemia   . Hypertension   . Mitral valve disorders(424.0)   . Osteoporosis   . Sciatica   . Thyroid disease    Hypothyroidism  . Vascular dementia, uncomplicated     Current Outpatient Medications  Medication Sig Dispense Refill  . alendronate (FOSAMAX) 70 MG tablet Take 1 tablet (70 mg total) by mouth once a week. Take with a full glass of water on an empty stomach. 12 tablet 0  . apixaban (ELIQUIS) 5 MG TABS tablet Take 1 tablet (5 mg total) by mouth 2 (two) times daily. 60 tablet 11   . ARMOUR THYROID 60 MG tablet TAKE 1 TABLET (60 MG TOTAL) BY MOUTH DAILY. 90 tablet 3  . Ascorbic Acid (VITAMIN C) 500 MG tablet Take 500 mg by mouth daily.      Marland Kitchen atorvastatin (LIPITOR) 40 MG tablet Take 1 tablet (40 mg total) by mouth daily at 6 PM. 30 tablet 2  . Calcium Carbonate-Vit D-Min (CALCIUM 1200 PO) Take 1 capsule by mouth daily.      . Cholecalciferol (VITAMIN D3) 10000 units TABS Take 50,000 Units by mouth every 30 (thirty) days.    . fish oil-omega-3 fatty acids 1000 MG capsule Take 1 g by mouth daily.      . furosemide (LASIX) 40 MG tablet Take 40 mg by mouth.    . L-Lysine 500 MG TABS Take 1 tablet by mouth daily.      . Selenium 200 MCG TABS Take 1 tablet by mouth daily.      . calcium carbonate (TUMS - DOSED IN MG ELEMENTAL CALCIUM) 500 MG chewable tablet Chew 2 tablets by mouth daily.    Marland Kitchen lisinopril (PRINIVIL,ZESTRIL) 40 MG tablet Take 1 tablet (40 mg total) by mouth daily. 90 tablet 3  . metoprolol tartrate (LOPRESSOR) 25 MG tablet Take 1 tablet (25 mg total) by mouth 2 (two) times daily. 180 tablet 3   No current facility-administered medications for this visit.     Allergies  Allergen Reactions  . Sulfonamide Derivatives     Family History  Problem Relation Age of Onset  . Other Mother        CHF  . Osteoporosis Mother   . Diabetes Mother   . Other Father        unknown  . Osteoporosis Father   . Heart disease Father     Social History   Socioeconomic History  . Marital status: Widowed    Spouse name: Not on file  . Number of children: 2  . Years of education: Not on file  . Highest education level: Not on file  Occupational History  . Occupation: Youth worker  Social Needs  . Financial resource strain: Not on file  . Food insecurity:    Worry: Not on file    Inability: Not on file  . Transportation needs:    Medical: Not on file    Non-medical: Not on file  Tobacco Use  . Smoking status: Never Smoker  . Smokeless  tobacco: Never Used  Substance and Sexual Activity  . Alcohol use: Yes    Alcohol/week: 3.0 oz    Types: 5 Glasses of wine per week    Comment: occasional  . Drug use: No  . Sexual activity: Never  Lifestyle  . Physical activity:    Days per week: Not on file    Minutes per session: Not on file  . Stress: Not on file  Relationships  . Social connections:    Talks on phone: Not on file    Gets together: Not on file    Attends religious service: Not on file    Active member of club or organization: Not on file    Attends meetings of clubs or organizations: Not on file    Relationship status: Not on file  . Intimate partner violence:    Fear of current or ex partner: Not on file    Emotionally abused: Not on file    Physically abused: Not on file    Forced sexual activity: Not on file  Other Topics Concern  . Not on file  Social History Narrative   Has living will   Son or daughter should make health care decisions   Will accept resuscitation     Constitutional: Denies fever, malaise, fatigue, headache or abrupt weight changes.  HEENT: Pt reports intermittent nose bleeds. Denies eye pain, eye redness, ear pain, ringing in the ears, wax buildup, runny nose, nasal congestion, or sore throat. Respiratory: Denies difficulty breathing, shortness of breath, cough or sputum production.   Cardiovascular: Denies chest pain, chest tightness, palpitations or swelling in the hands or feet.  Gastrointestinal: Denies abdominal pain, bloating, constipation, diarrhea or blood in the stool.  GU: Denies urgency, frequency, pain with urination, burning sensation, blood in urine, odor or discharge. Musculoskeletal: Pt reports difficulty with gait. Denies decrease in range of motion, muscle pain or joint pain and swelling.  Skin: Pt reports wound to RLE. Denies redness, rashes, lesions.  Neurological: Pt reports difficulty with memory. Denies dizziness, difficulty with speech or problems with  balance and coordination.  Psych: Denies anxiety, depression, SI/HI.  No other specific complaints in a complete review of systems (except as listed in HPI above).     Objective:   Physical Exam  BP (!) 141/78   Pulse 70   Temp 98 F (36.7 C)   Resp 20   Wt 131 lb (59.4 kg)   BMI 23.21 kg/m  Wt Readings from Last 3 Encounters:  10/25/17 140 lb (63.5 kg)  10/28/17  131 lb (59.4 kg)  07/25/17 133 lb 4.8 oz (60.5 kg)    General: Appears her stated age, well developed, well nourished in NAD. Skin: Open wound to medial malleous, draining serous fluid. No s/s of infection, cellulitis. Neck:  Neck supple, trachea midline. No masses, lumps present.  Cardiovascular: Normal rate with irregular rhythm. No murmur, rubs or gallops noted. No JVD or BLE edema. No carotid bruits noted. Pulmonary/Chest: Normal effort and positive vesicular breath sounds. No respiratory distress. No wheezes, rales or ronchi noted.  Abdomen: Soft and nontender. Normal bowel sounds.  Musculoskeletal: Gait slow but steady with use of walker. Neurological: Alert and oriented. Mildly confused at times, repeats herself.   BMET    Component Value Date/Time   NA 136 10/25/2017 1033   NA 141 02/24/2015 1041   NA 142 06/08/2014 1940   K 4.2 10/25/2017 1033   K 3.9 06/08/2014 1940   CL 102 10/25/2017 1033   CL 110 (H) 06/08/2014 1940   CO2 23 10/25/2017 1033   CO2 26 06/08/2014 1940   GLUCOSE 115 (H) 10/25/2017 1033   GLUCOSE 105 (H) 06/08/2014 1940   BUN 23 (H) 10/25/2017 1033   BUN 23 02/24/2015 1041   BUN 22 (H) 06/08/2014 1940   CREATININE 0.75 10/25/2017 1033   CREATININE 0.95 06/08/2014 1940   CREATININE 0.95 01/30/2013 1628   CALCIUM 8.6 (L) 10/25/2017 1033   CALCIUM 9.0 06/08/2014 1940   GFRNONAA >60 10/25/2017 1033   GFRNONAA 59 (L) 06/08/2014 1940   GFRAA >60 10/25/2017 1033   GFRAA >60 06/08/2014 1940    Lipid Panel     Component Value Date/Time   CHOL 215 (H) 01/12/2014 1400   TRIG  165.0 (H) 01/12/2014 1400   HDL 54.70 01/12/2014 1400   CHOLHDL 4 01/12/2014 1400   VLDL 33.0 01/12/2014 1400   LDLCALC 127 (H) 01/12/2014 1400    CBC    Component Value Date/Time   WBC 9.9 10/25/2017 1033   RBC 5.18 10/25/2017 1033   HGB 14.7 10/25/2017 1033   HGB 14.0 06/08/2014 1940   HCT 44.4 10/25/2017 1033   HCT 43.1 06/08/2014 1940   PLT 246 10/25/2017 1033   PLT 235 06/08/2014 1940   MCV 85.7 10/25/2017 1033   MCV 90 06/08/2014 1940   MCH 28.4 10/25/2017 1033   MCHC 33.1 10/25/2017 1033   RDW 16.0 (H) 10/25/2017 1033   RDW 14.4 06/08/2014 1940   LYMPHSABS 1.6 08/04/2016 1104   MONOABS 1.0 (H) 08/04/2016 1104   EOSABS 0.0 08/04/2016 1104   BASOSABS 0.1 08/04/2016 1104    Hgb A1C No results found for: HGBA1C          Assessment & Plan:   Open Wound, Right Ankle:  Treat with Hydrogel  Will reassess as needed Webb Silversmith, NP

## 2017-10-28 NOTE — Assessment & Plan Note (Signed)
Continue prn Tylenol.

## 2017-10-28 NOTE — Assessment & Plan Note (Signed)
Continue Fosamax Encouraged daily weight bearing activity

## 2017-10-28 NOTE — Assessment & Plan Note (Signed)
Residual effect on cognition Needs ALF Continue Atorvastatin, Lisinopril, Metoprolol and Eliquis

## 2017-10-28 NOTE — Assessment & Plan Note (Signed)
Controlled on Lisinopril and Metoprolol Monitor

## 2017-10-28 NOTE — Assessment & Plan Note (Signed)
Compensated Continue Lasix, Lisinopril and Metoprolol

## 2017-10-28 NOTE — Assessment & Plan Note (Signed)
Will check Free T4 annually Continue Armour Thyroid

## 2017-10-28 NOTE — Assessment & Plan Note (Signed)
Secondary to stroke On Atorvastatin and Eliquis Will continue for now

## 2017-10-28 NOTE — Assessment & Plan Note (Signed)
Will get CMET and lipid profile annually Continue Atorvastatin for now

## 2017-10-30 ENCOUNTER — Ambulatory Visit: Payer: Medicare Other | Admitting: Internal Medicine

## 2017-10-30 ENCOUNTER — Encounter: Payer: Self-pay | Admitting: Internal Medicine

## 2017-10-30 VITALS — BP 132/70 | HR 76 | Temp 98.1°F | Resp 20 | Wt 124.0 lb

## 2017-10-30 DIAGNOSIS — W19XXXD Unspecified fall, subsequent encounter: Secondary | ICD-10-CM

## 2017-10-30 DIAGNOSIS — S0083XD Contusion of other part of head, subsequent encounter: Secondary | ICD-10-CM

## 2017-10-30 NOTE — Progress Notes (Signed)
Subjective:    Patient ID: Donna Rosario, female    DOB: January 28, 1923, 84 y.o.   MRN: [id number]  HPI  Asked to eval resident in [address] s/p ER visit.  Fell 5/3. Hit face and arms bilaterally. CT head showed right forehead hematoma. CT neck, xray left wrist, xray left forearm negative for acute fracture. Was sent back to [facility] Resident reports some pain in the right forehead but denies pain otherwise No dizziness, headaches, visual changes  Review of Systems      Past Medical History:  Diagnosis Date  . Arthritis   . Atrial fibrillation [facility]) Sept. 2011   [facility] Admission for RVR  . Chronic low back pain    due to lumbosacral degenerative joint disease  . Disturbance of skin sensation   . Embolic stroke ([employer] [employer]) 15/1761  . Hyperlipidemia   . Hypertension   . Mitral valve disorders(424.0)   . Osteoporosis   . Sciatica   . Thyroid disease    Hypothyroidism  . Vascular dementia, uncomplicated     Current Outpatient Medications  Medication Sig Dispense Refill  . alendronate (FOSAMAX) 70 MG tablet Take 1 tablet (70 mg total) by mouth once a week. Take with a full glass of water on an empty stomach. 12 tablet 0  . apixaban (ELIQUIS) 5 MG TABS tablet Take 1 tablet (5 mg total) by mouth 2 (two) times daily. 60 tablet 11  . ARMOUR THYROID 60 MG tablet TAKE 1 TABLET (60 MG TOTAL) BY MOUTH DAILY. 90 tablet 3  . Ascorbic Acid (VITAMIN C) 500 MG tablet Take 500 mg by mouth daily.      Marland Kitchen atorvastatin (LIPITOR) 40 MG tablet Take 1 tablet (40 mg total) by mouth daily at 6 PM. 30 tablet 2  . calcium carbonate (TUMS - DOSED IN MG ELEMENTAL CALCIUM) 500 MG chewable tablet Chew 2 tablets by mouth daily.    . Calcium Carbonate-Vit D-Min (CALCIUM 1200 PO) Take 1 capsule by mouth daily.      . Cholecalciferol (VITAMIN D3) 10000 units TABS Take 50,000 Units by mouth every 30 (thirty) days.    . fish oil-omega-3 fatty acids 1000 MG capsule Take 1 g by mouth daily.      . furosemide (LASIX) 40  MG tablet Take 40 mg by mouth.    . L-Lysine 500 MG TABS Take 1 tablet by mouth daily.      Marland Kitchen lisinopril (PRINIVIL,ZESTRIL) 40 MG tablet Take 1 tablet (40 mg total) by mouth daily. 90 tablet 3  . metoprolol tartrate (LOPRESSOR) 25 MG tablet Take 1 tablet (25 mg total) by mouth 2 (two) times daily. 180 tablet 3  . Selenium 200 MCG TABS Take 1 tablet by mouth daily.       No current facility-administered medications for this visit.     Allergies  Allergen Reactions  . Sulfonamide Derivatives     Family History  Problem Relation Age of Onset  . Other Mother        CHF  . Osteoporosis Mother   . Diabetes Mother   . Other Father        unknown  . Osteoporosis Father   . Heart disease Father     Social History   Socioeconomic History  . Marital status: Widowed    Spouse name: Not on file  . Number of children: 2  . Years of education: Not on file  . Highest education level: Not on file  Occupational History  .  Occupation: Youth worker  Social Needs  . Financial resource strain: Not on file  . Food insecurity:    Worry: Not on file    Inability: Not on file  . Transportation needs:    Medical: Not on file    Non-medical: Not on file  Tobacco Use  . Smoking status: Never Smoker  . Smokeless tobacco: Never Used  Substance and Sexual Activity  . Alcohol use: Yes    Alcohol/week: 3.0 oz    Types: 5 Glasses of wine per week    Comment: occasional  . Drug use: No  . Sexual activity: Never  Lifestyle  . Physical activity:    Days per week: Not on file    Minutes per session: Not on file  . Stress: Not on file  Relationships  . Social connections:    Talks on phone: Not on file    Gets together: Not on file    Attends religious service: Not on file    Active member of club or organization: Not on file    Attends meetings of clubs or organizations: Not on file    Relationship status: Not on file  . Intimate partner violence:    Fear of current or  ex partner: Not on file    Emotionally abused: Not on file    Physically abused: Not on file    Forced sexual activity: Not on file  Other Topics Concern  . Not on file  Social History Narrative   Has living will   Son or daughter should make health care decisions   Will accept resuscitation     Constitutional: Denies fever, malaise, fatigue, headache or abrupt weight changes.  HEENT: Denies eye pain, eye redness, ear pain, ringing in the ears, wax buildup, runny nose, nasal congestion, bloody nose, or sore throat. Musculoskeletal: Denies decrease in range of motion, difficulty with gait, muscle pain or joint pain and swelling.  Skin: Pt reports hematoma to right forehead, multiple bruises. Denies redness, rashes, lesions or ulcercations.  Neurological: Pt reports difficulty with memory. Denies dizziness, difficulty with speech.   No other specific complaints in a complete review of systems (except as listed in HPI above).  Objective:   Physical Exam  BP 132/70   Pulse 76   Temp 98.1 F (36.7 C)   Resp 20   Wt 124 lb (56.2 kg)   BMI 21.97 kg/m    Wt Readings from Last 3 Encounters:  10/25/17 140 lb (63.5 kg)  10/28/17 131 lb (59.4 kg)  07/25/17 133 lb 4.8 oz (60.5 kg)    General: Appears her stated age, in NAD. Skin: Warm, dry and intact. Hematoma noted of right forehead. Bruising noted of right side of face and bilateral forearms.  HEENT: Head: normal shape and size; Eyes: sclera white, no icterus, conjunctiva pink, PERRLA and EOMs intact;  Musculoskeletal: No pain with palpation of facial bones. Normal flexion, extension and rotation of bilateral elbows and wrists.   Neurological: Alert and oriented. Gait slow and steady with use of rolling walker.   BMET    Component Value Date/Time   NA 136 10/25/2017 1033   NA 141 02/24/2015 1041   NA 142 06/08/2014 1940   K 4.2 10/25/2017 1033   K 3.9 06/08/2014 1940   CL 102 10/25/2017 1033   CL 110 (H) 06/08/2014 1940     CO2 23 10/25/2017 1033   CO2 26 06/08/2014 1940   GLUCOSE 115 (H) 10/25/2017 1033  GLUCOSE 105 (H) 06/08/2014 1940   BUN 23 (H) 10/25/2017 1033   BUN 23 02/24/2015 1041   BUN 22 (H) 06/08/2014 1940   CREATININE 0.75 10/25/2017 1033   CREATININE 0.95 06/08/2014 1940   CREATININE 0.95 01/30/2013 1628   CALCIUM 8.6 (L) 10/25/2017 1033   CALCIUM 9.0 06/08/2014 1940   GFRNONAA >60 10/25/2017 1033   GFRNONAA 59 (L) 06/08/2014 1940   GFRAA >60 10/25/2017 1033   GFRAA >60 06/08/2014 1940    Lipid Panel     Component Value Date/Time   CHOL 215 (H) 01/12/2014 1400   TRIG 165.0 (H) 01/12/2014 1400   HDL 54.70 01/12/2014 1400   CHOLHDL 4 01/12/2014 1400   VLDL 33.0 01/12/2014 1400   LDLCALC 127 (H) 01/12/2014 1400    CBC    Component Value Date/Time   WBC 9.9 10/25/2017 1033   RBC 5.18 10/25/2017 1033   HGB 14.7 10/25/2017 1033   HGB 14.0 06/08/2014 1940   HCT 44.4 10/25/2017 1033   HCT 43.1 06/08/2014 1940   PLT 246 10/25/2017 1033   PLT 235 06/08/2014 1940   MCV 85.7 10/25/2017 1033   MCV 90 06/08/2014 1940   MCH 28.4 10/25/2017 1033   MCHC 33.1 10/25/2017 1033   RDW 16.0 (H) 10/25/2017 1033   RDW 14.4 06/08/2014 1940   LYMPHSABS 1.6 08/04/2016 1104   MONOABS 1.0 (H) 08/04/2016 1104   EOSABS 0.0 08/04/2016 1104   BASOSABS 0.1 08/04/2016 1104    Hgb A1C No results found for: HGBA1C          Assessment & Plan:   ER Follow up for Fall, Hematoma of Forehead:  ER note, labs and imaging reviewed. Nothing fractured, no active bleeding, pain minimal Continue Tylenol prn Will monitor  Will reassess as needed Webb Silversmith, NP

## 2017-10-30 NOTE — Patient Instructions (Signed)
Hematoma A hematoma is a collection of blood. The collection of blood can turn into a hard, painful lump under the skin. Your skin may turn blue or yellow if the hematoma is close to the surface of the skin. Most hematomas get better in a few days to weeks. Some hematomas are serious and need medical care. Hematomas can be very small or very big. Follow these instructions at home:  Apply ice to the injured area: ? Put ice in a plastic bag. ? Place a towel between your skin and the bag. ? Leave the ice on for 20 minutes, 2-3 times a day for the first 1 to 2 days.  After the first 2 days, switch to using warm packs on the injured area.  Raise (elevate) the injured area to lessen pain and puffiness (swelling). You may also wrap the area with an elastic bandage. Make sure the bandage is not wrapped too tight.  If you have a painful hematoma on your leg or foot, you may use crutches for a couple days.  Only take medicines as told by your doctor. Get help right away if:  Your pain gets worse.  Your pain is not controlled with medicine.  You have a fever.  Your puffiness gets worse.  Your skin turns more blue or yellow.  Your skin over the hematoma breaks or starts bleeding.  Your hematoma is in your chest or belly (abdomen) and you are short of breath, feel weak, or have a change in consciousness.  Your hematoma is on your scalp and you have a headache that gets worse or a change in alertness or consciousness. This information is not intended to replace advice given to you by your health care provider. Make sure you discuss any questions you have with your health care provider. Document Released: 07/19/2004 Document Revised: 11/17/2015 Document Reviewed: 11/19/2012 Elsevier Interactive Patient Education  2017 [employer]vier Inc.  

## 2017-11-13 ENCOUNTER — Encounter: Payer: Self-pay | Admitting: Internal Medicine

## 2017-12-10 ENCOUNTER — Emergency Department
Admission: EM | Admit: 2017-12-10 | Discharge: 2017-12-10 | Disposition: A | Discharge status: Other Acute Inpatient Hospital | Payer: Medicare Other | Attending: Emergency Medicine | Admitting: Emergency Medicine

## 2017-12-10 ENCOUNTER — Other Ambulatory Visit: Payer: Self-pay

## 2017-12-10 ENCOUNTER — Emergency Department: Payer: Medicare Other

## 2017-12-10 ENCOUNTER — Inpatient Hospital Stay (HOSPITAL_COMMUNITY)
Admission: AD | Admit: 2017-12-10 | Discharge: 2017-12-23 | DRG: 082 | Disposition: E | Payer: Medicare Other | Source: Other Acute Inpatient Hospital | Attending: Internal Medicine | Admitting: Internal Medicine

## 2017-12-10 ENCOUNTER — Encounter (HOSPITAL_COMMUNITY): Payer: Self-pay | Admitting: Internal Medicine

## 2017-12-10 DIAGNOSIS — W19XXXA Unspecified fall, initial encounter: Secondary | ICD-10-CM | POA: Diagnosis not present

## 2017-12-10 DIAGNOSIS — Y92128 Other place in nursing home as the place of occurrence of the external cause: Secondary | ICD-10-CM | POA: Insufficient documentation

## 2017-12-10 DIAGNOSIS — J9601 Acute respiratory failure with hypoxia: Secondary | ICD-10-CM | POA: Diagnosis not present

## 2017-12-10 DIAGNOSIS — J690 Pneumonitis due to inhalation of food and vomit: Secondary | ICD-10-CM | POA: Diagnosis not present

## 2017-12-10 DIAGNOSIS — Z7901 Long term (current) use of anticoagulants: Secondary | ICD-10-CM

## 2017-12-10 DIAGNOSIS — Z96641 Presence of right artificial hip joint: Secondary | ICD-10-CM | POA: Insufficient documentation

## 2017-12-10 DIAGNOSIS — F05 Delirium due to known physiological condition: Secondary | ICD-10-CM | POA: Diagnosis not present

## 2017-12-10 DIAGNOSIS — I10 Essential (primary) hypertension: Secondary | ICD-10-CM

## 2017-12-10 DIAGNOSIS — G8929 Other chronic pain: Secondary | ICD-10-CM | POA: Diagnosis present

## 2017-12-10 DIAGNOSIS — R569 Unspecified convulsions: Secondary | ICD-10-CM | POA: Diagnosis not present

## 2017-12-10 DIAGNOSIS — M810 Age-related osteoporosis without current pathological fracture: Secondary | ICD-10-CM | POA: Diagnosis present

## 2017-12-10 DIAGNOSIS — Y999 Unspecified external cause status: Secondary | ICD-10-CM | POA: Insufficient documentation

## 2017-12-10 DIAGNOSIS — I5032 Chronic diastolic (congestive) heart failure: Secondary | ICD-10-CM

## 2017-12-10 DIAGNOSIS — E890 Postprocedural hypothyroidism: Secondary | ICD-10-CM | POA: Diagnosis present

## 2017-12-10 DIAGNOSIS — Z8249 Family history of ischemic heart disease and other diseases of the circulatory system: Secondary | ICD-10-CM

## 2017-12-10 DIAGNOSIS — G9341 Metabolic encephalopathy: Secondary | ICD-10-CM | POA: Diagnosis not present

## 2017-12-10 DIAGNOSIS — F0150 Vascular dementia without behavioral disturbance: Secondary | ICD-10-CM | POA: Diagnosis present

## 2017-12-10 DIAGNOSIS — Z8673 Personal history of transient ischemic attack (TIA), and cerebral infarction without residual deficits: Secondary | ICD-10-CM | POA: Diagnosis not present

## 2017-12-10 DIAGNOSIS — Y939 Activity, unspecified: Secondary | ICD-10-CM | POA: Diagnosis not present

## 2017-12-10 DIAGNOSIS — I110 Hypertensive heart disease with heart failure: Secondary | ICD-10-CM | POA: Diagnosis present

## 2017-12-10 DIAGNOSIS — Z66 Do not resuscitate: Secondary | ICD-10-CM | POA: Diagnosis not present

## 2017-12-10 DIAGNOSIS — Z515 Encounter for palliative care: Secondary | ICD-10-CM

## 2017-12-10 DIAGNOSIS — R0602 Shortness of breath: Secondary | ICD-10-CM

## 2017-12-10 DIAGNOSIS — M545 Low back pain: Secondary | ICD-10-CM | POA: Diagnosis present

## 2017-12-10 DIAGNOSIS — D72829 Elevated white blood cell count, unspecified: Secondary | ICD-10-CM

## 2017-12-10 DIAGNOSIS — I482 Chronic atrial fibrillation, unspecified: Secondary | ICD-10-CM | POA: Diagnosis present

## 2017-12-10 DIAGNOSIS — W1830XA Fall on same level, unspecified, initial encounter: Secondary | ICD-10-CM | POA: Diagnosis present

## 2017-12-10 DIAGNOSIS — E039 Hypothyroidism, unspecified: Secondary | ICD-10-CM | POA: Diagnosis not present

## 2017-12-10 DIAGNOSIS — S065X9A Traumatic subdural hemorrhage with loss of consciousness of unspecified duration, initial encounter: Secondary | ICD-10-CM

## 2017-12-10 DIAGNOSIS — S0101XA Laceration without foreign body of scalp, initial encounter: Secondary | ICD-10-CM

## 2017-12-10 DIAGNOSIS — Z8262 Family history of osteoporosis: Secondary | ICD-10-CM | POA: Diagnosis not present

## 2017-12-10 DIAGNOSIS — Z79890 Hormone replacement therapy (postmenopausal): Secondary | ICD-10-CM

## 2017-12-10 DIAGNOSIS — E785 Hyperlipidemia, unspecified: Secondary | ICD-10-CM | POA: Diagnosis present

## 2017-12-10 DIAGNOSIS — S065XAA Traumatic subdural hemorrhage with loss of consciousness status unknown, initial encounter: Secondary | ICD-10-CM

## 2017-12-10 DIAGNOSIS — F0390 Unspecified dementia without behavioral disturbance: Secondary | ICD-10-CM | POA: Insufficient documentation

## 2017-12-10 DIAGNOSIS — R451 Restlessness and agitation: Secondary | ICD-10-CM | POA: Diagnosis not present

## 2017-12-10 DIAGNOSIS — Z882 Allergy status to sulfonamides status: Secondary | ICD-10-CM

## 2017-12-10 DIAGNOSIS — Z79899 Other long term (current) drug therapy: Secondary | ICD-10-CM

## 2017-12-10 LAB — COMPREHENSIVE METABOLIC PANEL
ALBUMIN: 3.3 g/dL — AB (ref 3.5–5.0)
ALT: 32 U/L (ref 14–54)
AST: 46 U/L — AB (ref 15–41)
Alkaline Phosphatase: 121 U/L (ref 38–126)
Anion gap: 10 (ref 5–15)
BILIRUBIN TOTAL: 1.3 mg/dL — AB (ref 0.3–1.2)
BUN: 24 mg/dL — AB (ref 6–20)
CO2: 22 mmol/L (ref 22–32)
CREATININE: 0.82 mg/dL (ref 0.44–1.00)
Calcium: 8.4 mg/dL — ABNORMAL LOW (ref 8.9–10.3)
Chloride: 104 mmol/L (ref 101–111)
GFR calc Af Amer: >60 mL/min (ref >60)
GFR, EST NON AFRICAN AMERICAN: 60 mL/min — AB (ref >60)
GLUCOSE: 129 mg/dL — AB (ref 65–99)
POTASSIUM: 4.1 mmol/L (ref 3.5–5.1)
Sodium: 136 mmol/L (ref 135–145)
TOTAL PROTEIN: 6.9 g/dL (ref 6.5–8.1)

## 2017-12-10 LAB — CBC WITH DIFFERENTIAL/PLATELET
BASOS ABS: 0.1 10*3/uL (ref 0–0.1)
BASOS PCT: 0 %
Eosinophils Absolute: 0.1 10*3/uL (ref 0–0.7)
Eosinophils Relative: 1 %
HEMATOCRIT: 39.7 % (ref 35.0–47.0)
Hemoglobin: 13.1 g/dL (ref 12.0–16.0)
LYMPHS PCT: 7 %
Lymphs Abs: 1.1 10*3/uL (ref 1.0–3.6)
MCH: 28.6 pg (ref 26.0–34.0)
MCHC: 33.1 g/dL (ref 32.0–36.0)
MCV: 86.4 fL (ref 80.0–100.0)
Monocytes Absolute: 1.6 10*3/uL — ABNORMAL HIGH (ref 0.2–0.9)
Monocytes Relative: 10 %
NEUTROS ABS: 13.1 10*3/uL — AB (ref 1.4–6.5)
Neutrophils Relative %: 82 %
Platelets: 257 10*3/uL (ref 150–440)
RBC: 4.6 MIL/uL (ref 3.80–5.20)
RDW: 15.8 % — AB (ref 11.5–14.5)
WBC: 16 10*3/uL — ABNORMAL HIGH (ref 3.6–11.0)

## 2017-12-10 LAB — TROPONIN I

## 2017-12-10 LAB — PROTIME-INR
INR: 1.57
Prothrombin Time: 18.6 seconds — ABNORMAL HIGH (ref 11.4–15.2)

## 2017-12-10 MED ORDER — HYDRALAZINE HCL 20 MG/ML IJ SOLN
10.0000 mg | INTRAMUSCULAR | Status: DC | PRN
  Administered 2017-12-11 – 2017-12-12 (×3): 10 mg via INTRAVENOUS
  Filled 2017-12-10 (×5): qty 1

## 2017-12-10 MED ORDER — METOPROLOL TARTRATE 25 MG PO TABS
25.0000 mg | ORAL_TABLET | Freq: Two times a day (BID) | ORAL | Status: DC
  Administered 2017-12-11 (×2): 25 mg via ORAL
  Filled 2017-12-10 (×2): qty 1

## 2017-12-10 MED ORDER — ACETAMINOPHEN 325 MG PO TABS: 650.0000 mg | ORAL_TABLET | ORAL | Status: DC | PRN

## 2017-12-10 MED ORDER — ONDANSETRON HCL 4 MG/2ML IJ SOLN: 4.0000 mg | Freq: Four times a day (QID) | INTRAMUSCULAR | Status: DC | PRN

## 2017-12-10 MED ORDER — ONDANSETRON HCL 4 MG PO TABS: 4.0000 mg | ORAL_TABLET | Freq: Four times a day (QID) | ORAL | Status: DC | PRN

## 2017-12-10 MED ORDER — THYROID 60 MG PO TABS
60.0000 mg | ORAL_TABLET | Freq: Every day | ORAL | Status: DC
  Administered 2017-12-11 – 2017-12-13 (×3): 60 mg via ORAL
  Filled 2017-12-10 (×3): qty 1

## 2017-12-10 MED ORDER — OMEGA-3-ACID ETHYL ESTERS 1 G PO CAPS
1.0000 g | ORAL_CAPSULE | Freq: Every day | ORAL | Status: DC
  Administered 2017-12-11 – 2017-12-12 (×2): 1 g via ORAL
  Filled 2017-12-10 (×2): qty 1

## 2017-12-10 MED ORDER — L-LYSINE 500 MG PO TABS: 1.0000 | ORAL_TABLET | Freq: Every day | ORAL | Status: DC

## 2017-12-10 MED ORDER — VITAMIN K1 10 MG/ML IJ SOLN
10.0000 mg | Freq: Once | INTRAMUSCULAR | Status: AC
  Administered 2017-12-10: 10 mg via INTRAVENOUS
  Filled 2017-12-10: qty 1

## 2017-12-10 MED ORDER — CALCIUM CARBONATE ANTACID 500 MG PO CHEW
2.0000 | CHEWABLE_TABLET | Freq: Every day | ORAL | Status: DC
  Administered 2017-12-11 – 2017-12-12 (×2): 400 mg via ORAL
  Filled 2017-12-10 (×2): qty 2

## 2017-12-10 MED ORDER — DIPHENHYDRAMINE HCL 50 MG/ML IJ SOLN
25.0000 mg | Freq: Once | INTRAMUSCULAR | Status: AC
  Administered 2017-12-10: 25 mg via INTRAVENOUS
  Filled 2017-12-10: qty 1

## 2017-12-10 MED ORDER — ATORVASTATIN CALCIUM 40 MG PO TABS
40.0000 mg | ORAL_TABLET | Freq: Every day | ORAL | Status: DC
  Administered 2017-12-11: 40 mg via ORAL
  Filled 2017-12-10: qty 1

## 2017-12-10 MED ORDER — FUROSEMIDE 40 MG PO TABS
40.0000 mg | ORAL_TABLET | Freq: Every day | ORAL | Status: DC
  Administered 2017-12-11 – 2017-12-12 (×2): 40 mg via ORAL
  Filled 2017-12-10 (×2): qty 1

## 2017-12-10 MED ORDER — METOCLOPRAMIDE HCL 5 MG/ML IJ SOLN
10.0000 mg | Freq: Once | INTRAMUSCULAR | Status: AC
  Administered 2017-12-10: 10 mg via INTRAVENOUS
  Filled 2017-12-10: qty 2

## 2017-12-10 MED ORDER — LISINOPRIL 40 MG PO TABS
40.0000 mg | ORAL_TABLET | Freq: Every day | ORAL | Status: DC
  Administered 2017-12-11 – 2017-12-12 (×2): 40 mg via ORAL
  Filled 2017-12-10: qty 1
  Filled 2017-12-10: qty 2
  Filled 2017-12-10 (×2): qty 1
  Filled 2017-12-10: qty 2

## 2017-12-10 MED ORDER — PROTHROMBIN COMPLEX CONC HUMAN 500 UNITS IV KIT
2775.0000 [IU] | PACK | Freq: Once | Status: AC
  Administered 2017-12-10: 2775 [IU] via INTRAVENOUS
  Filled 2017-12-10: qty 2775

## 2017-12-10 NOTE — ED Triage Notes (Signed)
Pt arrived via ems for report of unwitnessed fall - appears that pt fell backwards and now has a lac to the back of the head - pt is A&O to baseline - pt is on Eliquis

## 2017-12-10 NOTE — Plan of Care (Signed)
Transfer request from [facility] ED, Dr. Juleen China.  89yow on apixaban presented with h/o fall, scalp hematoma, acute on chronic subdural hematoma.  Anticoagulation reversed with Kcentra.  Initial plan was to transfer to [facility], however there are no beds.   Per EDP, patient is neurologically intact, no focal deficits, mildly hypertensive but stable. PMH dementia. Pt at baseline.  Laboratory studies unremarkable.  Dr. Juleen China spoke with [facility] neurosurgeon Dr. Ronnald Ramp who advised treatment will likely be nonoperative, will see in consult, advised medical admission.  Will plan admission to stepdown unit to [facility] with neurosurgery consult.  Murray Hodgkins, MD [facility] [phone]

## 2017-12-10 NOTE — ED Notes (Signed)
Cassie RN present when hanging prothrombin complex (KCENTRA)

## 2017-12-10 NOTE — ED Notes (Signed)
Gradual increase in pt BP noted, EDP aware.  No new orders at this time.

## 2017-12-10 NOTE — ED Provider Notes (Signed)
[facility] Emergency Department Provider Note       Time seen: ----------------------------------------- 12:40 PM on 11/30/2017 ----------------------------------------- Level V caveat: History/ROS limited by dementia  I have reviewed the triage vital signs and the nursing notes.  HISTORY   Chief Complaint Fall and Head Laceration    HPI Donna Rosario is a 84 y.o. female with a history of arthritis, atrial fibrillation, embolic CVA, hypertension, hyperlipidemia, vascular dementia who presents to the ED for an unwitnessed fall at the nursing home.  Patient was in the [place] area and fell and hit her head.  She appeared to have fallen backwards and had a large complex laceration to the right parietal scalp.  She currently takes Eliquis due to her previously mentioned medical problems.  She cannot give any of her further review of systems or report.  Past Medical History:  Diagnosis Date  . Arthritis   . Atrial fibrillation [facility]) Sept. 2011   [facility] Admission for RVR  . Chronic low back pain    due to lumbosacral degenerative joint disease  . Disturbance of skin sensation   . Embolic stroke ([place]) 07/7739  . Hyperlipidemia   . Hypertension   . Mitral valve disorders(424.0)   . Osteoporosis   . Sciatica   . Thyroid disease    Hypothyroidism  . Vascular dementia, uncomplicated     Patient Active Problem List   Diagnosis Date Noted  . 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 Osteoarthritis 04/24/2017  . History of stroke 04/24/2017  . HLD (hyperlipidemia) 04/24/2017  . Osteoporosis 04/24/2017  . Hypothyroidism 05/30/2015  . Chronic diastolic CHF (congestive heart failure) ([place]) 08/10/2013  . Dementia 03/09/2011  . HYPERTENSION, BENIGN 03/30/2010  . Chronic atrial fibrillation ([place]) 03/30/2010    Past Surgical History:  Procedure Laterality Date  . JOINT REPLACEMENT     Right Hip Oct 2011,  Left Knee  2008 ([facility])  . KNEE SURGERY     Left knee  . THYROIDECTOMY    . TOTAL HIP ARTHROPLASTY  2011    Allergies Sulfonamide derivatives  Social History Social History   Tobacco Use  . Smoking status: Never Smoker  . Smokeless tobacco: Never Used  Substance Use Topics  . Alcohol use: Yes    Alcohol/week: 3.0 oz    Types: 5 Glasses of wine per week    Comment: occasional  . Drug use: No   Review of Systems Skin: Positive for large right-sided scalp laceration Neurological: Positive for head injury  All systems otherwise unknown  ____________________________________________   PHYSICAL EXAM:  VITAL SIGNS: ED Triage Vitals  Enc Vitals Group     BP 12/05/2017 1137 (!) 185/116     Pulse Rate 12/15/2017 1137 84     Resp 12/13/2017 1137 (!) 24     Temp 12/18/2017 1137 98.4 F (36.9 C)     Temp Source 12/19/2017 1137 Oral     SpO2 12/16/2017 1137 94 %     Weight 11/28/2017 1135 134 lb 12.8 oz (61.1 kg)     Height 12/05/2017 1135 5\' 3"  (1.6 m)     Head Circumference --      Peak Flow --      Pain Score 11/29/2017 1135 0     Pain Loc --      Pain Edu? --      Excl. in [place]? --    Constitutional: Alert but disoriented.  Mild distress Eyes: Conjunctivae are  normal. Normal extraocular movements. ENT   Head: Normocephalic, large complex right posterior parietal scalp laceration   Nose: No congestion/rhinnorhea.   Mouth/Throat: Mucous membranes are moist.   Neck: No stridor. Cardiovascular: Normal rate, regular rhythm. No murmurs, rubs, or gallops. Respiratory: Normal respiratory effort without tachypnea nor retractions. Breath sounds are clear and equal bilaterally. No wheezes/rales/rhonchi. Gastrointestinal: Soft and nontender. Normal bowel sounds Musculoskeletal: No lower extremity tenderness nor edema. Neurologic:  No gross focal neurologic deficits are appreciated.  Skin: Large right scalp laceration as dictated above. Psychiatric: Speech and behavior are normal.   ____________________________________________  ED COURSE:  As part of my medical decision making, I reviewed the following data within the [place] History obtained from family if available, nursing notes, old chart and ekg, as well as notes from prior ED visits. Patient presented for an unwitnessed fall, we will assess with labs and imaging as indicated at this time.  Patient also has an extensive laceration which will require repair.   Marland Kitchen.Laceration Repair Date/Time: 12/09/2017 12:43 PM Performed by: Earleen Newport, MD Authorized by: Earleen Newport, MD   Consent:    Consent obtained:  Emergent situation   Consent given by:  Patient Anesthesia (see MAR for exact dosages):    Anesthesia method:  None Repair type:    Repair type:  Complex Exploration:    Hemostasis achieved with:  Direct pressure   Wound exploration: entire depth of wound probed and visualized     Contaminated: no   Treatment:    Area cleansed with:  Saline   Amount of cleaning:  Standard   Irrigation solution:  Sterile saline   Visualized foreign bodies/material removed: no     Debridement:  None Skin repair:    Repair method:  Sutures and staples   Suture size:  5-0   Suture material:  Fast-absorbing gut   Number of sutures:  2   Number of staples:  9 Approximation:    Approximation:  Close Post-procedure details:    Dressing:  Sterile dressing   Patient tolerance of procedure:  Tolerated well, no immediate complications  EKG: Interpreted by me, atrial fibrillation with a rate of 81 bpm, possible LVH, normal axis, normal QT ____________________________________________   LABS (pertinent positives/negatives)  Labs Reviewed  CBC WITH DIFFERENTIAL/PLATELET - Abnormal; Notable for the following components:      Result Value   WBC 16.0 (*)    RDW 15.8 (*)    Neutro Abs 13.1 (*)    Monocytes Absolute 1.6 (*)    All other components within normal limits  COMPREHENSIVE  METABOLIC PANEL - Abnormal; Notable for the following components:   Glucose, Bld 129 (*)    BUN 24 (*)    Calcium 8.4 (*)    Albumin 3.3 (*)    AST 46 (*)    Total Bilirubin 1.3 (*)    GFR calc non Af Amer 60 (*)    All other components within normal limits  PROTIME-INR - Abnormal; Notable for the following components:   Prothrombin Time 18.6 (*)    All other components within normal limits  TROPONIN I    RADIOLOGY Images were viewed by me  CT head IMPRESSION: Right-sided acute on chronic subdural hematoma as described.  Atrophic and ischemic changes.  Right scalp hematoma as described.  Sinus changes on the left of uncertain chronicity. Air-fluid levels are noted as described.  Critical Value/emergent results were called by telephone at the time of interpretation on  12/06/2017 at 12:30 pm to Dr. Lenise Arena , who verbally acknowledged these results.  CRITICAL CARE Performed by: Laurence Aly   Total critical care time: 30 minutes  Critical care time was exclusive of separately billable procedures and treating other patients.  Critical care was necessary to treat or prevent imminent or life-threatening deterioration.  Critical care was time spent personally by me on the following activities: development of treatment plan with patient and/or surrogate as well as nursing, discussions with consultants, evaluation of patient's response to treatment, examination of patient, obtaining history from patient or surrogate, ordering and performing treatments and interventions, ordering and review of laboratory studies, ordering and review of radiographic studies, pulse oximetry and re-evaluation of patient's condition.  ____________________________________________  DIFFERENTIAL DIAGNOSIS   Contusion, scalp laceration, subdural hematoma, subarachnoid, skull fracture  FINAL ASSESSMENT AND PLAN  Fall, head injury, subdural hematoma, scalp laceration   Plan:  The patient had presented for unwitnessed fall. Patient's labs did reveal mild leukocytosis but were otherwise reassuring. Patient's imaging was unfortunately concerning for right-sided acute on chronic subdural hematoma.  Patient was given vitamin K and Kcentra for reversal of her Eliquis.  I had discussed the case with neurosurgery on-call.  Patient has been accepted in transfer to [facility] for observation.   Laurence Aly, MD   Note: This note was generated in part or whole with voice recognition software. Voice recognition is usually quite accurate but there are transcription errors that can and very often do occur. I apologize for any typographical errors that were not detected and corrected.     Earleen Newport, MD 11/30/2017 1346

## 2017-12-10 NOTE — ED Notes (Signed)
Blue top recollected per lab request

## 2017-12-10 NOTE — ED Notes (Signed)
Lac to back of head cleaned with normal saline and Dr Jimmye Norman at bedside Provider sutured (inner) lac to back of head and then placed staples (outer)

## 2017-12-10 NOTE — ED Notes (Signed)
Pressure dressing applied to lac on back of head per Dr Jimmye Norman

## 2017-12-10 NOTE — H&P (Addendum)
History and Physical    Donna Rosario GYJ:[id number] DOB: 06/24/1923 DOA: 12/13/2017  PCP: Venia Carbon, MD  Patient coming from: Assisted living facility.  Chief Complaint: Fall.  HPI: Donna Rosario is a 84 y.o. female with history of embolic CVA, atrial fibrillation, hypertension, hypothyroidism, dementia was brought to the ER after patient had an unwitnessed fall.  Patient lives in assisted living facility.  Patient was brought to the ER at [facility]  Patient after the fall had sustained a scalp hematoma.  CT head shows acute on chronic subdural hematoma.  On-call neurosurgeon Dr. Ronnald Ramp was consulted at [facility] and patient transferred to [facility] for further management.  Patient has baseline dementia and does not provide much history.  EKG shows A. fib with rate controlled.  As per the report patient was given Kcentra for reversing the anticoagulation.  Patient has mild leukocytosis but was afebrile.  On my exam patient is mildly lethargic after receiving IV Benadryl.  But follows commands and moves all extremities.  ED Course: Patient was a direct admit from [facility].  Review of Systems: As per HPI, rest all negative.   Past Medical History:  Diagnosis Date  . Arthritis   . Atrial fibrillation [facility]) Sept. 2011   [facility] Admission for RVR  . Chronic low back pain    due to lumbosacral degenerative joint disease  . Disturbance of skin sensation   . Embolic stroke ([place]) 26/3785  . Hyperlipidemia   . Hypertension   . Mitral valve disorders(424.0)   . Osteoporosis   . Sciatica   . Thyroid disease    Hypothyroidism  . Vascular dementia, uncomplicated     Past Surgical History:  Procedure Laterality Date  . JOINT REPLACEMENT     Right Hip Oct 2011,  Left Knee 2008 ([facility])  . KNEE SURGERY     Left knee  . THYROIDECTOMY    . TOTAL HIP ARTHROPLASTY  2011     reports that she has never smoked. She has never used  smokeless tobacco. She reports that she drinks about 3.0 oz of alcohol per week. She reports that she does not use drugs.  Allergies  Allergen Reactions  . Sulfonamide Derivatives     Family History  Problem Relation Age of Onset  . Other Mother        CHF  . Osteoporosis Mother   . Diabetes Mother   . Other Father        unknown  . Osteoporosis Father   . Heart disease Father     Prior to Admission medications   Medication Sig Start Date End Date Taking? Authorizing Provider  alendronate (FOSAMAX) 70 MG tablet Take 1 tablet (70 mg total) by mouth once a week. Take with a full glass of water on an empty stomach. 07/28/16   Crecencio Mc, MD  apixaban (ELIQUIS) 5 MG TABS tablet Take 1 tablet (5 mg total) by mouth 2 (two) times daily. 01/10/17   Venia Carbon, MD  ARMOUR THYROID 60 MG tablet TAKE 1 TABLET (60 MG TOTAL) BY MOUTH DAILY. 03/09/14   Crecencio Mc, MD  Ascorbic Acid (VITAMIN C) 500 MG tablet Take 500 mg by mouth daily.      [provider]  atorvastatin (LIPITOR) 40 MG tablet Take 1 tablet (40 mg total) by mouth daily at 6 PM. 08/07/16   Gladstone Lighter, MD  calcium carbonate (TUMS - DOSED IN MG ELEMENTAL CALCIUM)  500 MG chewable tablet Chew 2 tablets by mouth daily.    [provider]  Calcium Carbonate-Vit D-Min (CALCIUM 1200 PO) Take 1 capsule by mouth daily.      [provider]  Cholecalciferol (VITAMIN D3) 10000 units TABS Take 50,000 Units by mouth every 30 (thirty) days.    [provider]  fish oil-omega-3 fatty acids 1000 MG capsule Take 1 g by mouth daily.      [provider]  furosemide (LASIX) 40 MG tablet Take 40 mg by mouth.    [provider]  L-Lysine 500 MG TABS Take 1 tablet by mouth daily.      [provider]  lisinopril (PRINIVIL,ZESTRIL) 40 MG tablet Take 1 tablet (40 mg total) by mouth daily. 03/25/17 06/23/17  Minna Merritts, MD  metoprolol tartrate (LOPRESSOR) 25 MG tablet Take  1 tablet (25 mg total) by mouth 2 (two) times daily. 03/25/17 06/23/17  Minna Merritts, MD  Selenium 200 MCG TABS Take 1 tablet by mouth daily.      [provider]    Physical Exam: Vitals:   12/01/2017 2021  BP: (!) 197/109  Pulse: 91  Resp: 18  Temp: 98 F (36.7 C)  TempSrc: Oral  SpO2: 96%  Weight: 61.2 kg (134 lb 14.7 oz)  Height: 5\' 3"  (1.6 m)      Constitutional: Moderately built and nourished. Vitals:   12/16/2017 2021  BP: (!) 197/109  Pulse: 91  Resp: 18  Temp: 98 F (36.7 C)  TempSrc: Oral  SpO2: 96%  Weight: 61.2 kg (134 lb 14.7 oz)  Height: 5\' 3"  (1.6 m)   Eyes: Anicteric no pallor. ENMT: Scalp dressing done. Neck: No neck rigidity. Respiratory: No rhonchi or crepitations. Cardiovascular: S1-S2 heard no murmurs appreciated. Abdomen: Soft nontender bowel sounds present. Musculoskeletal: No edema.  Joint effusion. Skin: No rash.  Scalp hematoma. Neurologic: Alert awake oriented to name follows commands moves all extremities.  Pupils are reacting to light. Psychiatric: Oriented to name only.   Labs on Admission: I have personally reviewed following labs and imaging studies  CBC: Recent Labs  Lab 12/21/2017 1249  WBC 16.0*  NEUTROABS 13.1*  HGB 13.1  HCT 39.7  MCV 86.4  PLT 161   Basic Metabolic Panel: Recent Labs  Lab 12/06/2017 1249  NA 136  K 4.1  CL 104  CO2 22  GLUCOSE 129*  BUN 24*  CREATININE 0.82  CALCIUM 8.4*   GFR: Estimated Creatinine Clearance: 35.5 mL/min (by C-G formula based on SCr of 0.82 mg/dL). Liver Function Tests: Recent Labs  Lab 12/04/2017 1249  AST 46*  ALT 32  ALKPHOS 121  BILITOT 1.3*  PROT 6.9  ALBUMIN 3.3*   No results for input(s): LIPASE, AMYLASE in the last 168 hours. No results for input(s): AMMONIA in the last 168 hours. Coagulation Profile: Recent Labs  Lab 12/07/2017 1319  INR 1.57   Cardiac Enzymes: Recent Labs  Lab 12/05/2017 1249  TROPONINI <0.03   BNP (last 3 results) No  results for input(s): PROBNP in the last 8760 hours. HbA1C: No results for input(s): HGBA1C in the last 72 hours. CBG: No results for input(s): GLUCAP in the last 168 hours. Lipid Profile: No results for input(s): CHOL, HDL, LDLCALC, TRIG, CHOLHDL, LDLDIRECT in the last 72 hours. Thyroid Function Tests: No results for input(s): TSH, T4TOTAL, FREET4, T3FREE, THYROIDAB in the last 72 hours. Anemia Panel: No results for input(s): VITAMINB12, FOLATE, FERRITIN, TIBC, IRON, RETICCTPCT in the  last 72 hours. Urine analysis:    Component Value Date/Time   COLORURINE YELLOW (A) 10/25/2017 1033   APPEARANCEUR CLEAR (A) 10/25/2017 1033   LABSPEC 1.018 10/25/2017 1033   PHURINE 6.0 10/25/2017 1033   GLUCOSEU NEGATIVE 10/25/2017 1033   HGBUR NEGATIVE 10/25/2017 1033   BILIRUBINUR NEGATIVE 10/25/2017 1033   KETONESUR 5 (A) 10/25/2017 1033   PROTEINUR NEGATIVE 10/25/2017 1033   NITRITE NEGATIVE 10/25/2017 1033   LEUKOCYTESUR NEGATIVE 10/25/2017 1033   Sepsis Labs: @LABRCNTIP (procalcitonin:4,lacticidven:4) )No results found for this or any previous visit (from the past 240 hour(s)).   Radiological Exams on Admission: Ct Head Wo Contrast  Result Date: 12/18/2017 CLINICAL DATA:  Recent fall with posterior scalp laceration EXAM: CT HEAD WITHOUT CONTRAST TECHNIQUE: Contiguous axial images were obtained from the base of the skull through the vertex without intravenous contrast. COMPARISON:  10/25/17 FINDINGS: Brain: Diffuse atrophic changes and chronic white matter ischemic changes are again noted stable from the prior exam. There is evidence of a right-sided subdural hematoma which appears to have both acute and chronic components. Its maximum thickness is approximately 7 mm. No significant mass effect or midline shift is noted. No other hemorrhage is identified. Vascular: No hyperdense vessel or unexpected calcification. Skull: Normal. Negative for fracture or focal lesion. Sinuses/Orbits: Considerable  sinus disease is identified with air-fluid levels within the sphenoid and maxillary sinuses on the left. Other: Scalp hematoma with laceration and surgical staples is noted in the right posterior parietal region. IMPRESSION: Right-sided acute on chronic subdural hematoma as described. Atrophic and ischemic changes. Right scalp hematoma as described. Sinus changes on the left of uncertain chronicity. Air-fluid levels are noted as described. Critical Value/emergent results were called by telephone at the time of interpretation on 12/01/2017 at 12:30 pm to Dr. Lenise Arena , who verbally acknowledged these results. Electronically Signed   By: Inez Catalina M.D.   On: 12/09/2017 12:35    EKG: Independently reviewed.  A. fib rate controlled.  Assessment/Plan Principal Problem:   Subdural hematoma, acute (HCC) Active Problems:   HYPERTENSION, BENIGN   Chronic atrial fibrillation (HCC)   Chronic diastolic CHF (congestive heart failure) (HCC)   Hypothyroidism   HLD (hyperlipidemia)   Subdural hematoma ([place])    1. Acute on chronic subdural hematoma status post fall -Dr. Ronnald Ramp neurosurgery has been consulted.  Patient's apixaban is on hold and was reversed at [facility] with [place].  We will closely monitor and stepdown.  Further recommendations per neurosurgery. 2. A. fib rate controlled on metoprolol.  Apixaban discontinued due to subdural hematoma. 3. History of CHF.  Lasix and lisinopril.  Appears compensated. 4. Leukocytosis likely reactionary.  Patient afebrile.  Will check chest x-ray and UA. 5. Hypothyroidism on Armour Thyroid. 6. Hypertension uncontrolled -we will place patient on PRN IV hydralazine along with patient's lisinopril and metoprolol. 7. History of dementia presently no acute issues.   DVT prophylaxis: SCDs. Code Status: DNR. Family Communication: No family at the bedside. Disposition Plan: To be determined. Consults called: Neurosurgery. Admission status:  Inpatient.   Rise Patience MD [facility] Pager [phone].  If 7PM-7AM, please contact night-coverage [email or web address] Password [email or web address]  12/16/2017, 11:30 PM

## 2017-12-10 NOTE — ED Notes (Signed)
[facility]  CALLED  PER  DR  Jimmye Norman  MD

## 2017-12-10 NOTE — ED Provider Notes (Signed)
-----------------------------------------   5:56 PM on 12/06/2017 -----------------------------------------  I took over care on this patient from Dr. Jimmye Norman.  The patient has a subdural hematoma and is alert and neurologically stable, but will require monitoring at a facility with neurosurgical care with the possibility for intracranial intervention if needed.   I spoke to Dr. Brett Albino from [facility] who states that he would happily accept the patient however both [facility] and [facility] are full and on diversion at this time, so there is no bed availability.  He offered to place the patient on a waitlist if we did not find anywhere else for her.  Dr. Jimmye Norman had already contacted [facility] which is also full at this time.  I contacted Dr. Ronnald Ramp from neurosurgery at [facility].  He advised that he would happily consult on this patient but that she was not a likely surgical candidate and did not require neurosurgical admission.  He recommended that I admit the patient to the hospitalist with his service as a consult.  I then spoke to Dr. Sarajane Jews, the hospitalist at [facility] who kindly agreed to accept the patient to his service.  On reassessment, the patient is stable.  Her blood pressure is slightly elevated and we will continue to monitor it and treat if needed.  Her mental status remains at her baseline.  I also spoke to her daughter Nary Sneed over the phone to update her about the patient's status.  ----------------------------------------- 6:42 PM on 12/12/2017 -----------------------------------------  Patient remains stable in the ED.  Transport Lucianne Lei is en route.   Arta Silence, MD 12/17/2017 [phone]

## 2017-12-10 NOTE — ED Notes (Signed)
Attempted to call report to [facility]; no answer at this time.

## 2017-12-10 NOTE — ED Notes (Addendum)
Pt is unable to sign for self due to Dementia at baseline.  Family lives out of state and is also no here to sign for transfer.  EDP, Siadecki has been in contact with patients daughter, Meredith Mody who is aware of transfer.

## 2017-12-10 NOTE — Progress Notes (Signed)
Pharmacist monitoring of KCentra - Patient was on eliquis PTA, therefore these do not apply:  For WARFARIN reversal cases: pharmacist needs to schedule INRs post-infusion (60 min post-Kcentra, 30 min post-FEIBA) then every 6 hours x 4 and daily x 2   For DABIGATRAN reversal cases: pharmacist needs to schedule aPTT 12 and 24 hours post-Praxbind infusion.      Thomasenia Sales, PharmD, MBA, [place] Clinical Pharmacist

## 2017-12-11 ENCOUNTER — Inpatient Hospital Stay (HOSPITAL_COMMUNITY): Payer: Medicare Other

## 2017-12-11 LAB — HEPATIC FUNCTION PANEL
ALT: 26 U/L (ref 14–54)
AST: 30 U/L (ref 15–41)
Albumin: 2.8 g/dL — ABNORMAL LOW (ref 3.5–5.0)
Alkaline Phosphatase: 92 U/L (ref 38–126)
BILIRUBIN DIRECT: 0.3 mg/dL (ref 0.1–0.5)
Indirect Bilirubin: 0.8 mg/dL (ref 0.3–0.9)
Total Bilirubin: 1.1 mg/dL (ref 0.3–1.2)
Total Protein: 6.1 g/dL — ABNORMAL LOW (ref 6.5–8.1)

## 2017-12-11 LAB — URINALYSIS, ROUTINE W REFLEX MICROSCOPIC
BILIRUBIN URINE: NEGATIVE
Bacteria, UA: NONE SEEN
Glucose, UA: 50 mg/dL — AB
KETONES UR: NEGATIVE mg/dL
LEUKOCYTES UA: NEGATIVE
Nitrite: NEGATIVE
PH: 6 (ref 5.0–8.0)
PROTEIN: NEGATIVE mg/dL
Specific Gravity, Urine: 1.009 (ref 1.005–1.030)

## 2017-12-11 LAB — BASIC METABOLIC PANEL
ANION GAP: 7 (ref 5–15)
BUN: 18 mg/dL (ref 6–20)
CALCIUM: 8.5 mg/dL — AB (ref 8.9–10.3)
CO2: 25 mmol/L (ref 22–32)
Chloride: 107 mmol/L (ref 101–111)
Creatinine, Ser: 0.86 mg/dL (ref 0.44–1.00)
GFR, EST NON AFRICAN AMERICAN: 56 mL/min — AB (ref >60)
GLUCOSE: 139 mg/dL — AB (ref 65–99)
POTASSIUM: 3.6 mmol/L (ref 3.5–5.1)
Sodium: 139 mmol/L (ref 135–145)

## 2017-12-11 LAB — PROTIME-INR
INR: 1.3
PROTHROMBIN TIME: 16.1 s — AB (ref 11.4–15.2)

## 2017-12-11 LAB — CBC
HEMATOCRIT: 38.3 % (ref 36.0–46.0)
HEMOGLOBIN: 12.3 g/dL (ref 12.0–15.0)
MCH: 28.1 pg (ref 26.0–34.0)
MCHC: 32.1 g/dL (ref 30.0–36.0)
MCV: 87.4 fL (ref 78.0–100.0)
Platelets: 273 10*3/uL (ref 150–400)
RBC: 4.38 MIL/uL (ref 3.87–5.11)
RDW: 14.6 % (ref 11.5–15.5)
WBC: 15.7 10*3/uL — ABNORMAL HIGH (ref 4.0–10.5)

## 2017-12-11 LAB — BRAIN NATRIURETIC PEPTIDE: B Natriuretic Peptide: 657.5 pg/mL — ABNORMAL HIGH (ref 0.0–100.0)

## 2017-12-11 MED ORDER — IPRATROPIUM-ALBUTEROL 0.5-2.5 (3) MG/3ML IN SOLN
3.0000 mL | Freq: Three times a day (TID) | RESPIRATORY_TRACT | Status: DC
  Administered 2017-12-11: 3 mL via RESPIRATORY_TRACT
  Filled 2017-12-11 (×3): qty 3

## 2017-12-11 MED ORDER — FUROSEMIDE 10 MG/ML IJ SOLN
40.0000 mg | Freq: Once | INTRAMUSCULAR | Status: AC
  Administered 2017-12-11: 40 mg via INTRAVENOUS
  Filled 2017-12-11: qty 4

## 2017-12-11 MED ORDER — MELATONIN 3 MG PO TABS
3.0000 mg | ORAL_TABLET | Freq: Every evening | ORAL | Status: DC | PRN
  Administered 2017-12-11: 3 mg via ORAL
  Filled 2017-12-11 (×2): qty 1

## 2017-12-11 MED ORDER — PIPERACILLIN-TAZOBACTAM 3.375 G IVPB
3.3750 g | Freq: Three times a day (TID) | INTRAVENOUS | Status: DC
Start: 2017-12-11 — End: 2017-12-13
  Administered 2017-12-11 – 2017-12-13 (×6): 3.375 g via INTRAVENOUS
  Filled 2017-12-11 (×6): qty 50

## 2017-12-11 MED ORDER — ALBUTEROL SULFATE (2.5 MG/3ML) 0.083% IN NEBU: 2.5000 mg | INHALATION_SOLUTION | RESPIRATORY_TRACT | Status: DC | PRN

## 2017-12-11 MED ORDER — METOPROLOL TARTRATE 50 MG PO TABS
50.0000 mg | ORAL_TABLET | Freq: Two times a day (BID) | ORAL | Status: DC
  Administered 2017-12-11 – 2017-12-12 (×3): 50 mg via ORAL
  Filled 2017-12-11 (×3): qty 1

## 2017-12-11 MED ORDER — IPRATROPIUM-ALBUTEROL 0.5-2.5 (3) MG/3ML IN SOLN
3.0000 mL | Freq: Two times a day (BID) | RESPIRATORY_TRACT | Status: DC
  Filled 2017-12-11: qty 3

## 2017-12-11 NOTE — Evaluation (Signed)
Physical Therapy Evaluation Patient Details Name: Donna Rosario MRN: [id number] DOB: Jul 22, 1922 Today's Date: 12/11/2017   History of Present Illness  Donna Rosario is a 84 y.o. female with history of embolic CVA, atrial fibrillation, hypertension, hypothyroidism, dementia was brought to the ER after patient had an unwitnessed fall with scalp hematoma; Patient lives in assisted living facility. Sustained an Acute on chronic subdural hematoma status post fall. Neurosurgery consulted  Clinical Impression  Pt admitted with above diagnosis. Pt currently with functional limitations due to the deficits listed below (see PT Problem List). Very pleasant and participatory; Tells me she lives with her husband, then within a few minutes tells me her husband has passed away (tells me she is familiar with [place], but that she doesn't live in [place]); Her living situation (Assisted Living versus SNF) needs to be verified; She presents with gross weakness LLE compared to R, decr balance and decr activity tolerance;  Pt will benefit from skilled PT to increase their independence and safety with mobility to allow discharge to the venue listed below.       Follow Up Recommendations Other (comment)  HHPT at ALF versus SNF; (I value dc back to familiar caregivers, environment, and routine for patients with dementia; Still, will need more information re: what assist and services are available to Donna Rosario in her current living situation; Given the nature of her injury -- a fall -- if she is at an ALF, can they incr her services?)    Equipment Recommendations  Rolling walker with 5" wheels;3in1 (PT)    Recommendations for Other Services OT consult     Precautions / Restrictions Precautions Precautions: Fall      Mobility  Bed Mobility Overal bed mobility: Needs Assistance Bed Mobility: Supine to Sit     Supine to sit: Mod assist     General bed mobility comments: Light mod handheld assist to pull  to sit  Transfers Overall transfer level: Needs assistance Equipment used: Rolling walker (2 wheeled) Transfers: Sit to/from Stand Sit to Stand: Mod assist         General transfer comment: Light mod assist to power up and steady; Cues for hand placement  Ambulation/Gait Ambulation/Gait assistance: Min assist;Mod assist Gait Distance (Feet): 20 Feet Assistive device: Rolling walker (2 wheeled) Gait Pattern/deviations: Step-through pattern     General Gait Details: Very unsteady, dependent on UE support on RW  Stairs            Wheelchair Mobility    Modified Rankin (Stroke Patients Only) Modified Rankin (Stroke Patients Only) Pre-Morbid Rankin Score: Slight disability(Not specifically known) Modified Rankin: Moderately severe disability     Balance Overall balance assessment: Needs assistance Sitting-balance support: Bilateral upper extremity supported Sitting balance-Leahy Scale: Fair       Standing balance-Leahy Scale: Poor                               Pertinent Vitals/Pain Pain Assessment: No/denies pain    Home Living Family/patient expects to be discharged to:: Assisted living                 Additional Comments: Donna Rosario is an unreliavle historian, so unsure of PLOF, and if she used an assistive device; in H&P it states she is from an ALF; will need more information about PLOF and available assist    Prior Function           Comments: Will  need more info re: PLOF     Hand Dominance        Extremity/Trunk Assessment   Upper Extremity Assessment Upper Extremity Assessment: Generalized weakness    Lower Extremity Assessment Lower Extremity Assessment: Generalized weakness;RLE deficits/detail RLE Deficits / Details: Grossly 3+/5 throughout; grossly weaker compared to L side       Communication   Communication: HOH  Cognition Arousal/Alertness: Awake/alert Behavior During Therapy: WFL for tasks  assessed/performed Overall Cognitive Status: No family/caregiver present to determine baseline cognitive functioning                                 General Comments: Noted history of dementia; pleasant and participating      General Comments      Exercises     Assessment/Plan    PT Assessment Patient needs continued PT services  PT Problem List Decreased strength;Decreased activity tolerance;Decreased balance;Decreased mobility;Decreased coordination;Decreased cognition;Decreased knowledge of use of DME;Decreased safety awareness;Decreased knowledge of precautions       PT Treatment Interventions DME instruction;Gait training;Stair training;Functional mobility training;Therapeutic activities;Therapeutic exercise;Balance training;Neuromuscular re-education;Cognitive remediation;Patient/family education    PT Goals (Current goals can be found in the Care Plan section)  Acute Rehab PT Goals Patient Stated Goal: Agreeable to walk PT Goal Formulation: Patient unable to participate in goal setting Time For Goal Achievement: 12/25/17 Potential to Achieve Goals: Fair    Frequency Min 3X/week   Barriers to discharge   Will need more information re: available assist at her ALF, and whether they can incr services    Co-evaluation               AM-PAC PT "6 Clicks" Daily Activity  Outcome Measure Difficulty turning over in bed (including adjusting bedclothes, sheets and blankets)?: A Little Difficulty moving from lying on back to sitting on the side of the bed? : A Lot Difficulty sitting down on and standing up from a chair with arms (e.g., wheelchair, bedside commode, etc,.)?: Unable Help needed moving to and from a bed to chair (including a wheelchair)?: A Little Help needed walking in hospital room?: A Lot Help needed climbing 3-5 steps with a railing? : A Lot 6 Click Score: 13    End of Session Equipment Utilized During Treatment: Gait belt Activity  Tolerance: Patient tolerated treatment well Patient left: in chair;with call bell/phone within reach;with chair alarm set Nurse Communication: Mobility status(and pt need for bathroom) PT Visit Diagnosis: Unsteadiness on feet (R26.81);Other abnormalities of gait and mobility (R26.89);History of falling (Z91.81)    Time: 2633-3545 PT Time Calculation (min) (ACUTE ONLY): 19 min   Charges:   PT Evaluation $PT Eval Moderate Complexity: 1 Mod     PT G Codes:        Roney Marion, PT  Acute Rehabilitation Services Pager [phone] Office [phone]   Colletta [place] 12/11/2017, 11:40 AM

## 2017-12-11 NOTE — Evaluation (Signed)
Clinical/Bedside Swallow Evaluation Patient Details  Name: Donna Rosario MRN: [id number] Date of Birth: May 19, 1923  Today's Date: 12/11/2017 Time: SLP Start Time (ACUTE ONLY): 6734 SLP Stop Time (ACUTE ONLY): 0935 SLP Time Calculation (min) (ACUTE ONLY): 10 min  Past Medical History:  Past Medical History:  Diagnosis Date  . Arthritis   . Atrial fibrillation [facility]) Sept. 2011   [facility] Admission for RVR  . Chronic low back pain    due to lumbosacral degenerative joint disease  . Disturbance of skin sensation   . Embolic stroke ([place]) 19/3790  . Hyperlipidemia   . Hypertension   . Mitral valve disorders(424.0)   . Osteoporosis   . Sciatica   . Thyroid disease    Hypothyroidism  . Vascular dementia, uncomplicated    Past Surgical History:  Past Surgical History:  Procedure Laterality Date  . JOINT REPLACEMENT     Right Hip Oct 2011,  Left Knee 2008 ([facility])  . KNEE SURGERY     Left knee  . THYROIDECTOMY    . TOTAL HIP ARTHROPLASTY  2011   HPI:  Donna Rosario is a 84 y.o. female with history of embolic CVA, atrial fibrillation, hypertension, hypothyroidism, dementia was brought to the ER after patient had an unwitnessed fall.  Patient lives in assisted living facility.  Patient was brought to the ER at [facility].  Patient after the fall had sustained a scalp hematoma.  CT head shows acute on chronic subdural hematoma.  On-call neurosurgeon Dr. Ronnald Ramp was consulted at [facility] and patient transferred to [facility] for further management.  Patient has baseline dementia. CXR shows Cardiomegaly with vascular congestion, bibasilar atelectasis and probable small effusions.   Assessment / Plan / Recommendation Clinical Impression  Pt demonstrates mild oral deficits associated with severe dry mouth. Oropharyngeal function appears timely and strong, but there is an intermittent congested cough that occurs specficially whey pt phonates or upon exhalation. Likely  swallow function is WNL, but will proceed with MBS for objective assessment given potential for delayed sensation of aspiration.  SLP Visit Diagnosis: Dysphagia, oropharyngeal phase (R13.12)    Aspiration Risk  Moderate aspiration risk    Diet Recommendation Dysphagia 3 (Mech soft);Thin liquid   Liquid Administration via: Cup;Straw Medication Administration: Whole meds with liquid Supervision: Patient able to self feed Compensations: Minimize environmental distractions Postural Changes: Seated upright at 90 degrees    Other  Recommendations Oral Care Recommendations: Oral care BID   Follow up Recommendations Skilled Nursing facility      Frequency and Duration            Prognosis        Swallow Study   General HPI: Donna Rosario is a 84 y.o. female with history of embolic CVA, atrial fibrillation, hypertension, hypothyroidism, dementia was brought to the ER after patient had an unwitnessed fall.  Patient lives in assisted living facility.  Patient was brought to the ER at [facility]  Patient after the fall had sustained a scalp hematoma.  CT head shows acute on chronic subdural hematoma.  On-call neurosurgeon Dr. Ronnald Ramp was consulted at [facility] and patient transferred to [facility] for further management.  Patient has baseline dementia. CXR shows Cardiomegaly with vascular congestion, bibasilar atelectasis and probable small effusions. Type of Study: Bedside Swallow Evaluation Previous Swallow Assessment: none Diet Prior to this Study: NPO Temperature Spikes Noted: No Respiratory Status: Room air History of Recent Intubation: No Behavior/Cognition: Alert;Cooperative;Confused;Requires cueing Oral Cavity  Assessment: Dry Oral Care Completed by SLP: No Oral Cavity - Dentition: Adequate natural dentition Vision: Functional for self-feeding Self-Feeding Abilities: Able to feed self Patient Positioning: Upright in chair Baseline Vocal Quality:  Hoarse Volitional Cough: Congested Volitional Swallow: Able to elicit    Oral/Motor/Sensory Function Overall Oral Motor/Sensory Function: Within functional limits   Ice Chips     Thin Liquid Thin Liquid: Within functional limits Presentation: Cup;Straw    Nectar Thick Nectar Thick Liquid: Not tested   Honey Thick Honey Thick Liquid: Not tested   Puree Puree: Not tested   Solid   GO   Solid: Impaired Oral Phase Impairments: Impaired mastication Oral Phase Functional Implications: Oral residue;Prolonged oral transit;Impaired mastication       Herbie Baltimore, MA CCC-SLP [phone]  Sharad Vaneaton, Katherene Ponto 12/11/2017,10:50 AM

## 2017-12-11 NOTE — Consult Note (Signed)
Reason for Consult: SDH Referring Physician: EDP  Donna Rosario is an 84 y.o. female.   HPI:  84 year old patient presents to the ED after she fell at an assisted living striking her head. She was sent to [facility] where they transferred her here to [facility]. She has a history of dementia per daughter who accompanies her in the room.   Past Medical History:  Diagnosis Date  . Arthritis   . Atrial fibrillation [facility]) Sept. 2011   [facility] Admission for RVR  . Chronic low back pain    due to lumbosacral degenerative joint disease  . Disturbance of skin sensation   . Embolic stroke ([place]) 16/0737  . Hyperlipidemia   . Hypertension   . Mitral valve disorders(424.0)   . Osteoporosis   . Sciatica   . Thyroid disease    Hypothyroidism  . Vascular dementia, uncomplicated     Past Surgical History:  Procedure Laterality Date  . JOINT REPLACEMENT     Right Hip Oct 2011,  Left Knee 2008 ([facility])  . KNEE SURGERY     Left knee  . THYROIDECTOMY    . TOTAL HIP ARTHROPLASTY  2011    Allergies  Allergen Reactions  . Sulfa Antibiotics Hives    Social History   Tobacco Use  . Smoking status: Never Smoker  . Smokeless tobacco: Never Used  Substance Use Topics  . Alcohol use: Yes    Alcohol/week: 3.0 oz    Types: 5 Glasses of wine per week    Comment: occasional    Family History  Problem Relation Age of Onset  . Other Mother        CHF  . Osteoporosis Mother   . Diabetes Mother   . Other Father        unknown  . Osteoporosis Father   . Heart disease Father      Review of Systems  Positive ROS:  All other systems have been reviewed and were otherwise negative with the exception of those mentioned in the HPI and as above.  Objective: Vital signs in last 24 hours: Temp:  [97.8 F (36.6 C)-98.2 F (36.8 C)] 97.9 F (36.6 C) (06/19 1500) Pulse Rate:  [77-95] 94 (06/19 1500) Resp:  [15-31] 24 (06/19 1500) BP: (138-197)/(82-110) 156/85 (06/19 1500) SpO2:  [94 %-99 %] 99 % (06/19  1500) Weight:  [61.2 kg (134 lb 14.7 oz)] 61.2 kg (134 lb 14.7 oz) (06/18 2021)  General Appearance: Alert, cooperative, no distress, appears stated age Head: Normocephalic, without obvious abnormality, atraumatic Eyes: PERRL, conjunctiva/corneas clear, EOM's intact, fundi benign, both eyes      Ears: Normal TM's and external ear canals, both ears Throat: benign Neck: Supple, symmetrical, trachea midline, no adenopathy; thyroid: No enlargement/tenderness/nodules; no carotid bruit or JVD Back: Symmetric, no curvature, ROM normal, no CVA tenderness Lungs: Clear to auscultation bilaterally, respirations unlabored Heart: Regular rate and rhythm, S1 and S2 normal, no murmur, rub or gallop Abdomen: Soft, non-tender, bowel sounds active all four quadrants, no masses, no organomegaly Extremities: Extremities normal, atraumatic, no cyanosis or edema Pulses: 2+ and symmetric all extremities Skin: Skin color, texture, turgor normal, no rashes or lesions  NEUROLOGIC:   Mental status: sleeping comfortably Motor Exam - grossly normal, normal tone and bulk Sensory Exam - not tested Reflexes: not tested Coordination - grossly normal Gait - not tested Balance - not tested Cranial Nerves: I: smell Not tested  II: visual acuity  OS: na  OD: na  II: visual  fields Full to confrontation  II: pupils   III,VII: ptosis None  III,IV,VI: extraocular muscles  Full ROM  V: mastication   V: facial light touch sensation    V,VII: corneal reflex    VII: facial muscle function - upper    VII: facial muscle function - lower   VIII: hearing Not tested  IX: soft palate elevation  Normal  IX,X: gag reflex Present  XI: trapezius strength  5/5  XI: sternocleidomastoid strength 5/5  XI: neck flexion strength  5/5  XII: tongue strength      Data Review Lab Results  Component Value Date   WBC 15.7 (H) 12/11/2017   HGB 12.3 12/11/2017   HCT 38.3 12/11/2017   MCV 87.4 12/11/2017   PLT 273 12/11/2017    Lab Results  Component Value Date   NA 139 12/11/2017   K 3.6 12/11/2017   CL 107 12/11/2017   CO2 25 12/11/2017   BUN 18 12/11/2017   CREATININE 0.86 12/11/2017   GLUCOSE 139 (H) 12/11/2017   Lab Results  Component Value Date   INR 1.30 12/11/2017    Radiology: Ct Head Wo Contrast  Result Date: 11/23/2017 CLINICAL DATA:  Recent fall with posterior scalp laceration EXAM: CT HEAD WITHOUT CONTRAST TECHNIQUE: Contiguous axial images were obtained from the base of the skull through the vertex without intravenous contrast. COMPARISON:  10/25/17 FINDINGS: Brain: Diffuse atrophic changes and chronic white matter ischemic changes are again noted stable from the prior exam. There is evidence of a right-sided subdural hematoma which appears to have both acute and chronic components. Its maximum thickness is approximately 7 mm. No significant mass effect or midline shift is noted. No other hemorrhage is identified. Vascular: No hyperdense vessel or unexpected calcification. Skull: Normal. Negative for fracture or focal lesion. Sinuses/Orbits: Considerable sinus disease is identified with air-fluid levels within the sphenoid and maxillary sinuses on the left. Other: Scalp hematoma with laceration and surgical staples is noted in the right posterior parietal region. IMPRESSION: Right-sided acute on chronic subdural hematoma as described. Atrophic and ischemic changes. Right scalp hematoma as described. Sinus changes on the left of uncertain chronicity. Air-fluid levels are noted as described. Critical Value/emergent results were called by telephone at the time of interpretation on 12/05/2017 at 12:30 pm to Dr. Lenise Arena , who verbally acknowledged these results. Electronically Signed   By: Inez Catalina M.D.   On: 12/09/2017 12:35   Dg Chest Port 1 View  Result Date: 12/11/2017 CLINICAL DATA:  Leukocytosis EXAM: PORTABLE CHEST 1 VIEW COMPARISON:  08/04/2016 FINDINGS: Cardiomegaly. Mild vascular  congestion. Bibasilar atelectasis and possible small layering effusions. No acute bony abnormality. IMPRESSION: Cardiomegaly with vascular congestion, bibasilar atelectasis and probable small effusions. Electronically Signed   By: Rolm Baptise M.D.   On: 12/11/2017 08:05   Dg Swallowing Func-speech Pathology  Result Date: 12/11/2017 Objective Swallowing Evaluation: Type of Study: MBS-Modified Barium Swallow Study  Patient Details Name: Donna Rosario MRN: [id number] Date of Birth: April 17, 1923 Today's Date: 12/11/2017 Time: SLP Start Time (ACUTE ONLY): 1140 -SLP Stop Time (ACUTE ONLY): 1205 SLP Time Calculation (min) (ACUTE ONLY): 25 min Past Medical History: Past Medical History: Diagnosis Date . Arthritis  . Atrial fibrillation [facility]) Sept. 2011  [facility] Admission for RVR . Chronic low back pain   due to lumbosacral degenerative joint disease . Disturbance of skin sensation  . Embolic stroke ([place]) 08/5463 . Hyperlipidemia  . Hypertension  . Mitral valve disorders(424.0)  . Osteoporosis  . Sciatica  .  Thyroid disease   Hypothyroidism . Vascular dementia, uncomplicated  Past Surgical History: Past Surgical History: Procedure Laterality Date . JOINT REPLACEMENT    Right Hip Oct 2011,  Left Knee 2008 ([facility]) . KNEE SURGERY    Left knee . THYROIDECTOMY   . TOTAL HIP ARTHROPLASTY  2011 HPI: Shirrell Solinger is a 84 y.o. female with history of embolic CVA, atrial fibrillation, hypertension, hypothyroidism, dementia was brought to the ER after patient had an unwitnessed fall.  Patient lives in assisted living facility.  Patient was brought to the ER at [facility].  Patient after the fall had sustained a scalp hematoma.  CT head shows acute on chronic subdural hematoma.  On-call neurosurgeon Dr. Ronnald Ramp was consulted at [facility] and patient transferred to [facility] for further management.  Patient has baseline dementia. CXR shows Cardiomegaly with vascular congestion, bibasilar atelectasis and  probable small effusions.  No data recorded Assessment / Plan / Recommendation CHL IP CLINICAL IMPRESSIONS 12/11/2017 Clinical Impression Pt demonstrates a mild oropharyngeal dysphagia with slightly decreased duration of pharyngeal peristalsis, leaving mild vallecualr and pyriform sinus residuals. Trace penetration, eventually squeezed out occurs with cup sips, trace aspiration occurred with consecutive straw sips, though pt immediately sensed tracheal aspirate and ejected it with a cough.  Residue more significant with thickened textures; pt also does not follow commands well for successful use of strategies. Coughing without penetration/aspiration also occurred, possibly related to appearance of stasis in the mid to upper esophagus on esophageal sweep. Given low risk of aspiration despite impairments, recommend pt continue a dys 3/thin liquid diet will f/u for tolerance.  SLP Visit Diagnosis Dysphagia, oropharyngeal phase (R13.12) Attention and concentration deficit following -- Frontal lobe and executive function deficit following -- Impact on safety and function --   CHL IP TREATMENT RECOMMENDATION 12/11/2017 Treatment Recommendations Defer treatment plan to f/u with SLP   No flowsheet data found. CHL IP DIET RECOMMENDATION 12/11/2017 SLP Diet Recommendations Dysphagia 3 (Mech soft) solids;Thin liquid Liquid Administration via Cup;Straw Medication Administration Whole meds with puree Compensations Minimize environmental distractions Postural Changes Remain semi-upright after after feeds/meals (Comment)   CHL IP OTHER RECOMMENDATIONS 12/11/2017 Recommended Consults -- Oral Care Recommendations Oral care BID Other Recommendations --   CHL IP FOLLOW UP RECOMMENDATIONS 12/11/2017 Follow up Recommendations Skilled Nursing facility   [facility] IP FREQUENCY AND DURATION 12/11/2017 Speech Therapy Frequency (ACUTE ONLY) min 2x/week Treatment Duration 2 weeks      CHL IP ORAL PHASE 12/11/2017 Oral Phase Impaired Oral - Pudding  Teaspoon -- Oral - Pudding Cup -- Oral - Honey Teaspoon -- Oral - Honey Cup -- Oral - Nectar Teaspoon -- Oral - Nectar Cup Decreased bolus cohesion Oral - Nectar Straw -- Oral - Thin Teaspoon -- Oral - Thin Cup WFL Oral - Thin Straw WFL Oral - Puree Decreased bolus cohesion Oral - Mech Soft -- Oral - Regular Decreased bolus cohesion Oral - Multi-Consistency -- Oral - Pill -- Oral Phase - Comment --  CHL IP PHARYNGEAL PHASE 12/11/2017 Pharyngeal Phase Impaired Pharyngeal- Pudding Teaspoon -- Pharyngeal -- Pharyngeal- Pudding Cup -- Pharyngeal -- Pharyngeal- Honey Teaspoon -- Pharyngeal -- Pharyngeal- Honey Cup -- Pharyngeal -- Pharyngeal- Nectar Teaspoon -- Pharyngeal -- Pharyngeal- Nectar Cup Pharyngeal residue - valleculae Pharyngeal -- Pharyngeal- Nectar Straw -- Pharyngeal -- Pharyngeal- Thin Teaspoon -- Pharyngeal -- Pharyngeal- Thin Cup Pharyngeal residue - valleculae;Pharyngeal residue - pyriform;Penetration/Aspiration during swallow;Penetration/Apiration after swallow Pharyngeal Material enters airway, remains ABOVE vocal cords then ejected out;Material does not  enter airway Pharyngeal- Thin Straw Pharyngeal residue - valleculae;Pharyngeal residue - pyriform;Trace aspiration;Penetration/Apiration after swallow Pharyngeal Material enters airway, passes BELOW cords then ejected out;Material does not enter airway Pharyngeal- Puree Pharyngeal residue - valleculae;Pharyngeal residue - pyriform Pharyngeal -- Pharyngeal- Mechanical Soft -- Pharyngeal -- Pharyngeal- Regular Pharyngeal residue - valleculae;Pharyngeal residue - pyriform Pharyngeal -- Pharyngeal- Multi-consistency -- Pharyngeal -- Pharyngeal- Pill -- Pharyngeal -- Pharyngeal Comment --  No flowsheet data found. No flowsheet data found. Herbie Baltimore, [place] CCC-SLP [phone] Othelia Pulling Katherene Ponto 12/11/2017, 2:36 PM                Assessment/Plan: Pleasant 84 year old comes in today after a fall at an assisted living. She has an abrasion to the back  of her head. Ct showed right-sided acute on chronic subdural hematoma. We have discussed the risks and benefits of any type of surgery with her daughter. I do not believe she is a candidate for a craniotomy right now as the risks outweigh the benefits with her medical history and dementia. Her daughter understood and agrees that they would not want any type of intervention done regardless of whether her neurological status declines or not.    Ocie Cornfield Tiyana Galla 12/11/2017 4:53 PM

## 2017-12-11 NOTE — Progress Notes (Signed)
Tried to instruct the patient on using the flutter valve.  The patient was not able to understand and use the flutter.

## 2017-12-11 NOTE — Progress Notes (Signed)
Modified Barium Swallow Progress Note  Patient Details  Name: Donna Rosario MRN: [id number] Date of Birth: September 07, 1922  Today's Date: 12/11/2017  Modified Barium Swallow completed.  Full report located under Chart Review in the Imaging Section.  Brief recommendations include the following:  Clinical Impression  Pt demonstrates a mild oropharyngeal dysphagia with slightly decreased duration of pharyngeal peristalsis, leaving mild vallecualr and pyriform sinus residuals. Trace penetration, eventually squeezed out occurs with cup sips, trace aspiration occurred with consecutive straw sips, though pt immediately sensed tracheal aspirate and ejected it with a cough.  Residue more significant with thickened textures; pt also does not follow commands well for successful use of strategies. Coughing without penetration/aspiration also occurred, possibly related to appearance of stasis in the mid to upper esophagus on esophageal sweep. Given low risk of aspiration despite impairments, recommend pt continue a dys 3/thin liquid diet will f/u for tolerance.    Swallow Evaluation Recommendations       SLP Diet Recommendations: Dysphagia 3 (Mech soft) solids;Thin liquid   Liquid Administration via: Cup;Straw   Medication Administration: Whole meds with puree   Supervision: Patient able to self feed   Compensations: Minimize environmental distractions   Postural Changes: Remain semi-upright after after feeds/meals (Comment)   Oral Care Recommendations: Oral care BID       Herbie Baltimore, MA CCC-SLP [phone]  Lynann Beaver 12/11/2017,2:35 PM

## 2017-12-11 NOTE — Progress Notes (Signed)
PROGRESS NOTE        PATIENT DETAILS Name: Donna Rosario Age: 84 y.o. Sex: female Date of Birth: 21-Jan-1923 Admit Date: 12/08/2017 Admitting Physician Samuella Cota, MD QVZ:DGLOVF, Theophilus Kinds, MD  Brief Narrative: Patient is a 84 y.o. female with history of chronic atrial fibrillation on anticoagulation, dementia, chronic diastolic heart failure presented from a assisted living facility secondary to a mechanical fall, she was found to have acute on chronic subdural hematoma, anticoagulation was reversed with Kcentra.  Patient was transferred to [facility] for neurosurgery evaluation.  See below for further details.  Subjective: Confused-seems to be accumulating secretions-and is "gurgling".  Assessment/Plan: Acute on chronic subdural hematoma: Appears to be neurologically intact-await further recommendations from neurosurgery.  Eliquis has been discontinued.  Fall resulting in scalp laceration and subdural hematoma: Laceration sutured in the emergency room.  Supportive care.  Probable aspiration pneumonia: Appears to be accumulating secretions in the back of her throat-WBC is significantly elevated-chest x-ray shows possible bibasilar atelectasis/pneumonia (personally reviewed).  Start Zosyn and follow.  Start chest PT-encourage incentive spirometry/flutter valve if she is able to follow commands.  Bronchodilators as needed.  Evaluated by SLP-started on dysphagia 3 diet.  Chronic atrial fibrillation: Rate controlled with metoprolol-no longer on Eliquis due to above.  Chronic diastolic heart failure: Compensated, continue with Lasix and lisinopril.  Hypothyroidism: Continue Armour Thyroid  Hypertension: Uncontrolled-increase metoprolol to 50 mg twice daily, continue lisinopril and Lasix.  Will follow and adjust accordingly.  Dyslipidemia: Continue statin  Dementia: At risk for delirium.  Supportive care.  Advanced directives: DNR in place.  Spoke  with patient's daughter at [facility] is aware of poor overall prognosis-and risk of decompensation especially with ongoing aspiration pneumonia.  She is aware apart from antibiotics, attempt to mobilize secretions-really no other interventions possible.  If she deteriorates, hospice care would be recommended.  Telemetry: Personally reviewed-A. fib  DVT Prophylaxis: SCD's  Code Status:  DNR  Family Communication: Daughter at bedside  Disposition Plan: Remain inpatient-SNF on discharge over the next few days  Antimicrobial agents: Anti-infectives (From admission, onward)   None      Procedures: None  CONSULTS: Neurosurgery  Time spent: 35- minutes-Greater than 50% of this time was spent in counseling, explanation of diagnosis, planning of further management, and coordination of care.  MEDICATIONS: Scheduled Meds: . atorvastatin  40 mg Oral q1800  . calcium carbonate  2 tablet Oral Daily  . furosemide  40 mg Intravenous Once  . furosemide  40 mg Oral Daily  . ipratropium-albuterol  3 mL Nebulization Q8H  . lisinopril  40 mg Oral Daily  . metoprolol tartrate  25 mg Oral BID  . omega-3 acid ethyl esters  1 g Oral Daily  . thyroid  60 mg Oral QAC breakfast   Continuous Infusions: PRN Meds:.acetaminophen, albuterol, hydrALAZINE, Melatonin, ondansetron **OR** ondansetron (ZOFRAN) IV   PHYSICAL EXAM: Vital signs: Vitals:   12/11/17 0456 12/11/17 0807 12/11/17 1213 12/11/17 1415  BP: 138/85 (!) 172/90 (!) 194/100 (!) 182/109  Pulse: 86 81 77   Resp: 16 16 18  (!) 30  Temp: 98.1 F (36.7 C) 97.8 F (36.6 C) 98 F (36.7 C)   TempSrc: Oral Oral Oral   SpO2: 96% 94% 96%   Weight:      Height:       Filed Weights   12/07/2017 2021  Weight: 61.2 kg (134 lb 14.7 oz)   Body mass index is 23.9 kg/m.   General appearance: Confused-in mild distress-accumulating secretions. Eyes:.Pink conjunctiva HEENT: Atraumatic and Normocephalic Neck: supple Resp:Good air entry  bilaterally, congested/transmitted sounds CVS: S1 S2 regular, no murmurs.  GI: Bowel sounds present, Non tender and not distended with no gaurding, rigidity or rebound.No organomegaly Extremities: B/L Lower Ext shows no edema, both legs are warm to touch Neurology: Difficult exam but appears to be nonfocal Musculoskeletal:No digital cyanosis Skin:No Rash, warm and dry Wounds:N/A  I have personally reviewed following labs and imaging studies  LABORATORY DATA: CBC: Recent Labs  Lab 12/04/2017 1249 12/11/17 0609  WBC 16.0* 15.7*  NEUTROABS 13.1*  --   HGB 13.1 12.3  HCT 39.7 38.3  MCV 86.4 87.4  PLT 257 865    Basic Metabolic Panel: Recent Labs  Lab 11/25/2017 1249 12/11/17 0609  NA 136 139  K 4.1 3.6  CL 104 107  CO2 22 25  GLUCOSE 129* 139*  BUN 24* 18  CREATININE 0.82 0.86  CALCIUM 8.4* 8.5*    GFR: Estimated Creatinine Clearance: 33.8 mL/min (by C-G formula based on SCr of 0.86 mg/dL).  Liver Function Tests: Recent Labs  Lab 11/30/2017 1249 12/11/17 0609  AST 46* 30  ALT 32 26  ALKPHOS 121 92  BILITOT 1.3* 1.1  PROT 6.9 6.1*  ALBUMIN 3.3* 2.8*   No results for input(s): LIPASE, AMYLASE in the last 168 hours. No results for input(s): AMMONIA in the last 168 hours.  Coagulation Profile: Recent Labs  Lab 12/13/2017 1319 12/11/17 0609  INR 1.57 1.30    Cardiac Enzymes: Recent Labs  Lab 12/16/2017 1249  TROPONINI <0.03    BNP (last 3 results) No results for input(s): PROBNP in the last 8760 hours.  HbA1C: No results for input(s): HGBA1C in the last 72 hours.  CBG: No results for input(s): GLUCAP in the last 168 hours.  Lipid Profile: No results for input(s): CHOL, HDL, LDLCALC, TRIG, CHOLHDL, LDLDIRECT in the last 72 hours.  Thyroid Function Tests: No results for input(s): TSH, T4TOTAL, FREET4, T3FREE, THYROIDAB in the last 72 hours.  Anemia Panel: No results for input(s): VITAMINB12, FOLATE, FERRITIN, TIBC, IRON, RETICCTPCT in the last 72  hours.  Urine analysis:    Component Value Date/Time   COLORURINE YELLOW (A) 10/25/2017 1033   APPEARANCEUR CLEAR (A) 10/25/2017 1033   LABSPEC 1.018 10/25/2017 1033   PHURINE 6.0 10/25/2017 1033   GLUCOSEU NEGATIVE 10/25/2017 1033   HGBUR NEGATIVE 10/25/2017 1033   BILIRUBINUR NEGATIVE 10/25/2017 1033   KETONESUR 5 (A) 10/25/2017 1033   PROTEINUR NEGATIVE 10/25/2017 1033   NITRITE NEGATIVE 10/25/2017 1033   LEUKOCYTESUR NEGATIVE 10/25/2017 1033    Sepsis Labs: Lactic Acid, Venous No results found for: LATICACIDVEN  MICROBIOLOGY: No results found for this or any previous visit (from the past 240 hour(s)).  RADIOLOGY STUDIES/RESULTS: Ct Head Wo Contrast  Result Date: 12/07/2017 CLINICAL DATA:  Recent fall with posterior scalp laceration EXAM: CT HEAD WITHOUT CONTRAST TECHNIQUE: Contiguous axial images were obtained from the base of the skull through the vertex without intravenous contrast. COMPARISON:  10/25/17 FINDINGS: Brain: Diffuse atrophic changes and chronic white matter ischemic changes are again noted stable from the prior exam. There is evidence of a right-sided subdural hematoma which appears to have both acute and chronic components. Its maximum thickness is approximately 7 mm. No significant mass effect or midline shift is noted. No other hemorrhage is identified. Vascular: No hyperdense  vessel or unexpected calcification. Skull: Normal. Negative for fracture or focal lesion. Sinuses/Orbits: Considerable sinus disease is identified with air-fluid levels within the sphenoid and maxillary sinuses on the left. Other: Scalp hematoma with laceration and surgical staples is noted in the right posterior parietal region. IMPRESSION: Right-sided acute on chronic subdural hematoma as described. Atrophic and ischemic changes. Right scalp hematoma as described. Sinus changes on the left of uncertain chronicity. Air-fluid levels are noted as described. Critical Value/emergent results were  called by telephone at the time of interpretation on 11/25/2017 at 12:30 pm to Dr. Lenise Arena , who verbally acknowledged these results. Electronically Signed   By: Inez Catalina M.D.   On: 12/16/2017 12:35   Dg Chest Port 1 View  Result Date: 12/11/2017 CLINICAL DATA:  Leukocytosis EXAM: PORTABLE CHEST 1 VIEW COMPARISON:  08/04/2016 FINDINGS: Cardiomegaly. Mild vascular congestion. Bibasilar atelectasis and possible small layering effusions. No acute bony abnormality. IMPRESSION: Cardiomegaly with vascular congestion, bibasilar atelectasis and probable small effusions. Electronically Signed   By: Rolm Baptise M.D.   On: 12/11/2017 08:05   Dg Swallowing Func-speech Pathology  Result Date: 12/11/2017 Objective Swallowing Evaluation: Type of Study: MBS-Modified Barium Swallow Study  Patient Details Name: Lola Czerwonka MRN: [id number] Date of Birth: [birth year] Today's Date: 12/11/2017 Time: SLP Start Time (ACUTE ONLY): 1140 -SLP Stop Time (ACUTE ONLY): 1205 SLP Time Calculation (min) (ACUTE ONLY): 25 min Past Medical History: Past Medical History: Diagnosis Date . Arthritis  . Atrial fibrillation [facility]) Sept. 2011  [facility] Admission for RVR . Chronic low back pain   due to lumbosacral degenerative joint disease . Disturbance of skin sensation  . Embolic stroke ([place]) 42/5956 . Hyperlipidemia  . Hypertension  . Mitral valve disorders(424.0)  . Osteoporosis  . Sciatica  . Thyroid disease   Hypothyroidism . Vascular dementia, uncomplicated  Past Surgical History: Past Surgical History: Procedure Laterality Date . JOINT REPLACEMENT    Right Hip Oct 2011,  Left Knee 2008 ([place]) . KNEE SURGERY    Left knee . THYROIDECTOMY   . TOTAL HIP ARTHROPLASTY  2011 HPI: Tiannah Greenly is a 84 y.o. female with history of embolic CVA, atrial fibrillation, hypertension, hypothyroidism, dementia was brought to the ER after patient had an unwitnessed fall.  Patient lives in assisted living facility.  Patient was brought to the ER at  [facility].  Patient after the fall had sustained a scalp hematoma.  CT head shows acute on chronic subdural hematoma.  On-call neurosurgeon Dr. Ronnald Ramp was consulted at [facility] and patient transferred to [facility] for further management.  Patient has baseline dementia. CXR shows Cardiomegaly with vascular congestion, bibasilar atelectasis and probable small effusions.  No data recorded Assessment / Plan / Recommendation CHL IP CLINICAL IMPRESSIONS 12/11/2017 Clinical Impression Pt demonstrates a mild oropharyngeal dysphagia with slightly decreased duration of pharyngeal peristalsis, leaving mild vallecualr and pyriform sinus residuals. Trace penetration, eventually squeezed out occurs with cup sips, trace aspiration occurred with consecutive straw sips, though pt immediately sensed tracheal aspirate and ejected it with a cough.  Residue more significant with thickened textures; pt also does not follow commands well for successful use of strategies. Coughing without penetration/aspiration also occurred, possibly related to appearance of stasis in the mid to upper esophagus on esophageal sweep. Given low risk of aspiration despite impairments, recommend pt continue a dys 3/thin liquid diet will f/u for tolerance.  SLP Visit Diagnosis Dysphagia, oropharyngeal phase (R13.12) Attention and concentration deficit following -- Frontal lobe and  executive function deficit following -- Impact on safety and function --   CHL IP TREATMENT RECOMMENDATION 12/11/2017 Treatment Recommendations Defer treatment plan to f/u with SLP   No flowsheet data found. CHL IP DIET RECOMMENDATION 12/11/2017 SLP Diet Recommendations Dysphagia 3 (Mech soft) solids;Thin liquid Liquid Administration via Cup;Straw Medication Administration Whole meds with puree Compensations Minimize environmental distractions Postural Changes Remain semi-upright after after feeds/meals (Comment)   CHL IP OTHER RECOMMENDATIONS 12/11/2017  Recommended Consults -- Oral Care Recommendations Oral care BID Other Recommendations --   CHL IP FOLLOW UP RECOMMENDATIONS 12/11/2017 Follow up Recommendations Skilled Nursing facility   [facility] IP FREQUENCY AND DURATION 12/11/2017 Speech Therapy Frequency (ACUTE ONLY) min 2x/week Treatment Duration 2 weeks      CHL IP ORAL PHASE 12/11/2017 Oral Phase Impaired Oral - Pudding Teaspoon -- Oral - Pudding Cup -- Oral - Honey Teaspoon -- Oral - Honey Cup -- Oral - Nectar Teaspoon -- Oral - Nectar Cup Decreased bolus cohesion Oral - Nectar Straw -- Oral - Thin Teaspoon -- Oral - Thin Cup WFL Oral - Thin Straw WFL Oral - Puree Decreased bolus cohesion Oral - Mech Soft -- Oral - Regular Decreased bolus cohesion Oral - Multi-Consistency -- Oral - Pill -- Oral Phase - Comment --  CHL IP PHARYNGEAL PHASE 12/11/2017 Pharyngeal Phase Impaired Pharyngeal- Pudding Teaspoon -- Pharyngeal -- Pharyngeal- Pudding Cup -- Pharyngeal -- Pharyngeal- Honey Teaspoon -- Pharyngeal -- Pharyngeal- Honey Cup -- Pharyngeal -- Pharyngeal- Nectar Teaspoon -- Pharyngeal -- Pharyngeal- Nectar Cup Pharyngeal residue - valleculae Pharyngeal -- Pharyngeal- Nectar Straw -- Pharyngeal -- Pharyngeal- Thin Teaspoon -- Pharyngeal -- Pharyngeal- Thin Cup Pharyngeal residue - valleculae;Pharyngeal residue - pyriform;Penetration/Aspiration during swallow;Penetration/Apiration after swallow Pharyngeal Material enters airway, remains ABOVE vocal cords then ejected out;Material does not enter airway Pharyngeal- Thin Straw Pharyngeal residue - valleculae;Pharyngeal residue - pyriform;Trace aspiration;Penetration/Apiration after swallow Pharyngeal Material enters airway, passes BELOW cords then ejected out;Material does not enter airway Pharyngeal- Puree Pharyngeal residue - valleculae;Pharyngeal residue - pyriform Pharyngeal -- Pharyngeal- Mechanical Soft -- Pharyngeal -- Pharyngeal- Regular Pharyngeal residue - valleculae;Pharyngeal residue - pyriform Pharyngeal --  Pharyngeal- Multi-consistency -- Pharyngeal -- Pharyngeal- Pill -- Pharyngeal -- Pharyngeal Comment --  No flowsheet data found. No flowsheet data found. Herbie Baltimore, [place] [phone] Lynann Beaver 12/11/2017, 2:36 PM                LOS: 1 day   Oren Binet, MD  [facility]  If 7PM-7AM, please contact night-coverage  Please page via [email or web address]-Password [email or web address]-click on MD name and type text message  12/11/2017, 2:44 PM

## 2017-12-11 NOTE — Progress Notes (Signed)
Pharmacy Antibiotic Note  Carrah Eppolito is a 84 y.o. female admitted on 12/07/2017 with aspiration pneumonia.  Pharmacy has been consulted for Zosyn dosing.  Plan: Zosyn 3.375g IV q8h EI Follow c/s, clinical progression, de-escalation, renal function  Height: 5\' 3"  (160 cm) Weight: 134 lb 14.7 oz (61.2 kg) IBW/kg (Calculated) : 52.4  Temp (24hrs), Avg:98 F (36.7 C), Min:97.8 F (36.6 C), Max:98.2 F (36.8 C)  Recent Labs  Lab 11/29/2017 1249 12/11/17 0609  WBC 16.0* 15.7*  CREATININE 0.82 0.86    Estimated Creatinine Clearance: 33.8 mL/min (by C-G formula based on SCr of 0.86 mg/dL).    Allergies  Allergen Reactions  . Sulfa Antibiotics Hives    Antimicrobials this admission: Zosyn 6/19 >>    Dose adjustments this admission: n/a  Microbiology results: none  Thank you for allowing pharmacy to be a part of this patient's care.  Kacelyn Rowzee D. Ky Rumple, PharmD, BCPS Clinical Pharmacist [phone] Please check AMION for all [place] numbers 12/11/2017 3:18 PM

## 2017-12-12 ENCOUNTER — Inpatient Hospital Stay (HOSPITAL_COMMUNITY): Payer: Medicare Other

## 2017-12-12 DIAGNOSIS — R451 Restlessness and agitation: Secondary | ICD-10-CM

## 2017-12-12 DIAGNOSIS — Z66 Do not resuscitate: Secondary | ICD-10-CM

## 2017-12-12 DIAGNOSIS — Z515 Encounter for palliative care: Secondary | ICD-10-CM

## 2017-12-12 LAB — CBC
HEMATOCRIT: 39.5 % (ref 36.0–46.0)
HEMOGLOBIN: 12.9 g/dL (ref 12.0–15.0)
MCH: 28.3 pg (ref 26.0–34.0)
MCHC: 32.7 g/dL (ref 30.0–36.0)
MCV: 86.6 fL (ref 78.0–100.0)
Platelets: 283 10*3/uL (ref 150–400)
RBC: 4.56 MIL/uL (ref 3.87–5.11)
RDW: 14.7 % (ref 11.5–15.5)
WBC: 17.4 10*3/uL — ABNORMAL HIGH (ref 4.0–10.5)

## 2017-12-12 LAB — BASIC METABOLIC PANEL
Anion gap: 11 (ref 5–15)
BUN: 17 mg/dL (ref 6–20)
CO2: 21 mmol/L — AB (ref 22–32)
Calcium: 8.6 mg/dL — ABNORMAL LOW (ref 8.9–10.3)
Chloride: 104 mmol/L (ref 101–111)
Creatinine, Ser: 0.88 mg/dL (ref 0.44–1.00)
GFR calc non Af Amer: 55 mL/min — ABNORMAL LOW (ref >60)
Glucose, Bld: 147 mg/dL — ABNORMAL HIGH (ref 65–99)
POTASSIUM: 3.2 mmol/L — AB (ref 3.5–5.1)
SODIUM: 136 mmol/L (ref 135–145)

## 2017-12-12 MED ORDER — RISPERIDONE 0.5 MG PO TABS
0.2500 mg | ORAL_TABLET | Freq: Every day | ORAL | Status: DC
  Administered 2017-12-12: 0.25 mg via ORAL
  Filled 2017-12-12: qty 1

## 2017-12-12 MED ORDER — RESOURCE THICKENUP CLEAR PO POWD
ORAL | Status: DC | PRN
  Filled 2017-12-12 (×2): qty 125

## 2017-12-12 MED ORDER — HALOPERIDOL LACTATE 5 MG/ML IJ SOLN: 2.0000 mg | Freq: Four times a day (QID) | INTRAMUSCULAR | Status: DC | PRN

## 2017-12-12 MED ORDER — HALOPERIDOL LACTATE 5 MG/ML IJ SOLN: 1.0000 mg | Freq: Four times a day (QID) | INTRAMUSCULAR | Status: DC | PRN

## 2017-12-12 MED ORDER — MELATONIN 3 MG PO TABS
3.0000 mg | ORAL_TABLET | Freq: Every day | ORAL | Status: DC
  Administered 2017-12-12: 3 mg via ORAL
  Filled 2017-12-12: qty 1

## 2017-12-12 MED ORDER — SODIUM CHLORIDE 0.9 % IV SOLN: INTRAVENOUS | Status: DC

## 2017-12-12 MED ORDER — DEXTROSE-NACL 5-0.9 % IV SOLN
INTRAVENOUS | Status: DC
  Administered 2017-12-12 – 2017-12-13 (×2): via INTRAVENOUS

## 2017-12-12 MED ORDER — HALOPERIDOL LACTATE 5 MG/ML IJ SOLN
2.5000 mg | Freq: Once | INTRAMUSCULAR | Status: AC
  Administered 2017-12-12: 2.5 mg via INTRAVENOUS
  Filled 2017-12-12: qty 1

## 2017-12-12 MED ORDER — POTASSIUM CHLORIDE 10 MEQ/100ML IV SOLN
10.0000 meq | INTRAVENOUS | Status: AC
Start: 2017-12-12 — End: 2017-12-12
  Administered 2017-12-12 (×2): 10 meq via INTRAVENOUS
  Filled 2017-12-12 (×2): qty 100

## 2017-12-12 NOTE — Progress Notes (Signed)
OT Cancellation Note  Patient Details Name: Donna Rosario MRN: [id number] DOB: January 19, 1923   Cancelled Treatment:    Reason Eval/Treat Not Completed: Other (comment). Pt received Haldol this am and is very lethargic. Spoke with rep at [facility] who states that the pt was independent with ambulation and ADL PTA without an AD. Discussed with daughter who is requesting for her Mom to rest this am. Educated on importance of being in "chair position" in or pulmonary reasons after she "rests". Daughter verbalized understanding. Will assess tomorrow if appropriate.   Ramond Dial, OT/L  OT Clinical Specialist [phone]  12/12/2017, 12:18 PM

## 2017-12-12 NOTE — Progress Notes (Signed)
PROGRESS NOTE        PATIENT DETAILS Name: Donna Rosario Age: 84 y.o. Sex: female Date of Birth: 1923/01/17 Admit Date: 11/27/2017 Admitting Physician Samuella Cota, MD IZT:[id number], Theophilus Kinds, MD  Brief Narrative: Patient is a 84 y.o. female with history of chronic atrial fibrillation on anticoagulation, dementia, chronic diastolic heart failure presented from a assisted living facility secondary to a mechanical fall, she was found to have acute on chronic subdural hematoma, anticoagulation was reversed with Kcentra.  Patient was transferred to [facility] for neurosurgery evaluation.  See below for further details.  Subjective: Got agitated last night-received Haldol-still drowsy-and accumulating secretions this morning.    Assessment/Plan: Acute on chronic subdural hematoma: Eliquis has been discontinued-agree with neurosurgery-best to proceed with gentle medical treatment in this frail elderly patient.  Hard to assess neurological status as she is drowsy-received Haldol earlier this morning.    Fall resulting in scalp laceration and subdural hematoma: Laceration was sutured in the emergency room-continue with supportive care.    Acute hypoxic respiratory failure secondary to aspiration pneumonia: Still continues to accumulate secretions-significantly transmitted upper airway sounds-continue Zosyn-appreciate speech therapy evaluation.  Continue supportive care oxygen supplementation.  High risk for continued deterioration--daughter at bedside-explained difficult/complicated situation.  Have consulted palliative care but family is leaning towards initiating comfort care if she continues to slowly deteriorate.   Chronic atrial fibrillation: Rate controlled-continue metoprolol.  Eliquis discontinued due to SDH.  Chronic diastolic heart failure: Volume status is stable-continue Lasix.  Hypothyroidism: Continue [place] Thyroid  Hypertension: Fluctuating  but mostly on the higher side-continue lisinopril, metoprolol-dosages adjusted yesterday-we will follow another 24 hours before adjusting any further.  On as needed hydralazine as well.  .  Dyslipidemia: Continue statin  Dementia with delirium: Agitated/confused last night-received Haldol and now very sedated and drowsy-difficult situation she has already fallen and sustained a subdural hematoma-discussed at length with patient's daughter-we will start low-dose Risperdal nightly and see how she does.   Advanced directives: DNR in place.  Discussed at length with patient's daughter at bedside-unfortunately she continues to aspirate-and is at risk for further worsening.  Have consulted palliative care-patient's son is arriving tomorrow-if this trend continues-we may need have no option apart from transition to full comfort measures.  We will continue to engage with family-we will await further recommendations from palliative care team.    Telemetry: Sinus rhythm  DVT Prophylaxis: SCD's  Code Status:  DNR  Family Communication: Daughter at bedside  Disposition Plan: Remain inpatient-SNF on discharge over the next few days if she stabilizes, otherwise may need residential hospice.  Antimicrobial agents: Anti-infectives (From admission, onward)   Start     Dose/Rate Route Frequency Ordered Stop   12/11/17 1530  piperacillin-tazobactam (ZOSYN) IVPB 3.375 g     3.375 g 12.5 mL/hr over 240 Minutes Intravenous Every 8 hours 12/11/17 1518        Procedures: None  CONSULTS: Neurosurgery  Time spent: 40 minutes- Greater than 50% of this time was spent in counseling, explanation of diagnosis, planning of further management, and coordination of care.  MEDICATIONS: Scheduled Meds: . atorvastatin  40 mg Oral q1800  . calcium carbonate  2 tablet Oral Daily  . furosemide  40 mg Oral Daily  . ipratropium-albuterol  3 mL Nebulization BID  . lisinopril  40 mg Oral Daily  . metoprolol  tartrate  50 mg Oral BID  . omega-3 acid ethyl esters  1 g Oral Daily  . risperiDONE  0.25 mg Oral QHS  . thyroid  60 mg Oral QAC breakfast   Continuous Infusions: . piperacillin-tazobactam (ZOSYN)  IV 3.375 g (12/12/17 0513)   PRN Meds:.acetaminophen, albuterol, hydrALAZINE, Melatonin, ondansetron **OR** ondansetron (ZOFRAN) IV, RESOURCE THICKENUP CLEAR   PHYSICAL EXAM: Vital signs: Vitals:   12/12/17 0430 12/12/17 0737 12/12/17 0800 12/12/17 0830  BP:  (!) 193/108 (!) 187/116 (!) 155/72  Pulse: 99 82    Resp:   20 18  Temp: 98.8 F (37.1 C) 98.3 F (36.8 C)    TempSrc: Axillary Axillary    SpO2: 94% 94%    Weight:      Height:       Filed Weights   12/04/2017 2021  Weight: 61.2 kg (134 lb 14.7 oz)   Body mass index is 23.9 kg/m.   General appearance: Drowsy-still accumulating secretions. Eyes:no scleral icterus. HEENT: Atraumatic and Normocephalic Neck: supple Resp:Good air entry bilaterally, significantly transmitted upper airway sounds  CVS: S1 S2 regular GI: Bowel sounds present, Non tender and not distended with no gaurding, rigidity or rebound. Extremities: B/L Lower Ext shows no edema, both legs are warm to touch Neurology: Difficult evaluation-but seems to move all 4 extremities. Musculoskeletal:No digital cyanosis Skin:No Rash, warm and dry Wounds:N/A  I have personally reviewed following labs and imaging studies  LABORATORY DATA: CBC: Recent Labs  Lab 12/16/2017 1249 12/11/17 0609 12/12/17 0639  WBC 16.0* 15.7* 17.4*  NEUTROABS 13.1*  --   --   HGB 13.1 12.3 12.9  HCT 39.7 38.3 39.5  MCV 86.4 87.4 86.6  PLT 257 273 798    Basic Metabolic Panel: Recent Labs  Lab 12/05/2017 1249 12/11/17 0609 12/12/17 0639  NA 136 139 136  K 4.1 3.6 3.2*  CL 104 107 104  CO2 22 25 21*  GLUCOSE 129* 139* 147*  BUN 24* 18 17  CREATININE 0.82 0.86 0.88  CALCIUM 8.4* 8.5* 8.6*    GFR: Estimated Creatinine Clearance: 33 mL/min (by C-G formula based on SCr  of 0.88 mg/dL).  Liver Function Tests: Recent Labs  Lab 12/03/2017 1249 12/11/17 0609  AST 46* 30  ALT 32 26  ALKPHOS 121 92  BILITOT 1.3* 1.1  PROT 6.9 6.1*  ALBUMIN 3.3* 2.8*   No results for input(s): LIPASE, AMYLASE in the last 168 hours. No results for input(s): AMMONIA in the last 168 hours.  Coagulation Profile: Recent Labs  Lab 11/30/2017 1319 12/11/17 0609  INR 1.57 1.30    Cardiac Enzymes: Recent Labs  Lab 12/22/2017 1249  TROPONINI <0.03    BNP (last 3 results) No results for input(s): PROBNP in the last 8760 hours.  HbA1C: No results for input(s): HGBA1C in the last 72 hours.  CBG: No results for input(s): GLUCAP in the last 168 hours.  Lipid Profile: No results for input(s): CHOL, HDL, LDLCALC, TRIG, CHOLHDL, LDLDIRECT in the last 72 hours.  Thyroid Function Tests: No results for input(s): TSH, T4TOTAL, FREET4, T3FREE, THYROIDAB in the last 72 hours.  Anemia Panel: No results for input(s): VITAMINB12, FOLATE, FERRITIN, TIBC, IRON, RETICCTPCT in the last 72 hours.  Urine analysis:    Component Value Date/Time   COLORURINE YELLOW 12/11/2017 1809   APPEARANCEUR CLEAR 12/11/2017 1809   LABSPEC 1.009 12/11/2017 1809   PHURINE 6.0 12/11/2017 1809   GLUCOSEU 50 (A) 12/11/2017 1809   HGBUR SMALL (A) 12/11/2017 1809   BILIRUBINUR  NEGATIVE 12/11/2017 [place] 12/11/2017 1809   PROTEINUR NEGATIVE 12/11/2017 1809   NITRITE NEGATIVE 12/11/2017 1809   LEUKOCYTESUR NEGATIVE 12/11/2017 1809    Sepsis Labs: Lactic Acid, Venous No results found for: LATICACIDVEN  MICROBIOLOGY: No results found for this or any previous visit (from the past 240 hour(s)).  RADIOLOGY STUDIES/RESULTS: Ct Head Wo Contrast  Result Date: 12/22/2017 CLINICAL DATA:  Recent fall with posterior scalp laceration EXAM: CT HEAD WITHOUT CONTRAST TECHNIQUE: Contiguous axial images were obtained from the base of the skull through the vertex without intravenous contrast.  COMPARISON:  10/25/17 FINDINGS: Brain: Diffuse atrophic changes and chronic white matter ischemic changes are again noted stable from the prior exam. There is evidence of a right-sided subdural hematoma which appears to have both acute and chronic components. Its maximum thickness is approximately 7 mm. No significant mass effect or midline shift is noted. No other hemorrhage is identified. Vascular: No hyperdense vessel or unexpected calcification. Skull: Normal. Negative for fracture or focal lesion. Sinuses/Orbits: Considerable sinus disease is identified with air-fluid levels within the sphenoid and maxillary sinuses on the left. Other: Scalp hematoma with laceration and surgical staples is noted in the right posterior parietal region. IMPRESSION: Right-sided acute on chronic subdural hematoma as described. Atrophic and ischemic changes. Right scalp hematoma as described. Sinus changes on the left of uncertain chronicity. Air-fluid levels are noted as described. Critical Value/emergent results were called by telephone at the time of interpretation on 11/28/2017 at 12:30 pm to Dr. Lenise Arena , who verbally acknowledged these results. Electronically Signed   By: Inez Catalina M.D.   On: 12/09/2017 12:35   Dg Chest Port 1 View  Result Date: 12/12/2017 CLINICAL DATA:  Shortness of breath.  Wheezing. EXAM: PORTABLE CHEST 1 VIEW COMPARISON:  12/11/2017. FINDINGS: Mediastinum and hilar structures normal. Stable cardiomegaly. Interim partial clearing of bibasilar atelectasis and small pleural effusions. No pneumothorax. Old left rib fractures. IMPRESSION: 1.  Stable cardiomegaly and mild vascular congestion. 2. Interim partial clearing of bibasilar atelectasis and small pleural effusions. Electronically Signed   By: Marcello Moores  Register   On: 12/12/2017 08:43   Dg Chest Port 1 View  Result Date: 12/11/2017 CLINICAL DATA:  Leukocytosis EXAM: PORTABLE CHEST 1 VIEW COMPARISON:  08/04/2016 FINDINGS: Cardiomegaly.  Mild vascular congestion. Bibasilar atelectasis and possible small layering effusions. No acute bony abnormality. IMPRESSION: Cardiomegaly with vascular congestion, bibasilar atelectasis and probable small effusions. Electronically Signed   By: Rolm Baptise M.D.   On: 12/11/2017 08:05   Dg Swallowing Func-speech Pathology  Result Date: 12/11/2017 Objective Swallowing Evaluation: Type of Study: MBS-Modified Barium Swallow Study  Patient Details Name: Donna Rosario MRN: [id number] Date of Birth: Oct 25, 1922 Today's Date: 12/11/2017 Time: SLP Start Time (ACUTE ONLY): 1140 -SLP Stop Time (ACUTE ONLY): 1205 SLP Time Calculation (min) (ACUTE ONLY): 25 min Past Medical History: Past Medical History: Diagnosis Date . Arthritis  . Atrial fibrillation [facility]) Sept. 2011  [facility] Admission for RVR . Chronic low back pain   due to lumbosacral degenerative joint disease . Disturbance of skin sensation  . Embolic stroke ([place]) 13/2440 . Hyperlipidemia  . Hypertension  . Mitral valve disorders(424.0)  . Osteoporosis  . Sciatica  . Thyroid disease   Hypothyroidism . Vascular dementia, uncomplicated  Past Surgical History: Past Surgical History: Procedure Laterality Date . JOINT REPLACEMENT    Right Hip Oct 2011,  Left Knee 2008 ([place]) . KNEE SURGERY    Left knee . THYROIDECTOMY   . TOTAL HIP  ARTHROPLASTY  2011 HPI: Landree Fernholz is a 84 y.o. female with history of embolic CVA, atrial fibrillation, hypertension, hypothyroidism, dementia was brought to the ER after patient had an unwitnessed fall.  Patient lives in assisted living facility.  Patient was brought to the ER at [facility].  Patient after the fall had sustained a scalp hematoma.  CT head shows acute on chronic subdural hematoma.  On-call neurosurgeon Dr. Ronnald Ramp was consulted at [facility] and patient transferred to [facility] for further management.  Patient has baseline dementia. CXR shows Cardiomegaly with vascular congestion, bibasilar  atelectasis and probable small effusions.  No data recorded Assessment / Plan / Recommendation CHL IP CLINICAL IMPRESSIONS 12/11/2017 Clinical Impression Pt demonstrates a mild oropharyngeal dysphagia with slightly decreased duration of pharyngeal peristalsis, leaving mild vallecualr and pyriform sinus residuals. Trace penetration, eventually squeezed out occurs with cup sips, trace aspiration occurred with consecutive straw sips, though pt immediately sensed tracheal aspirate and ejected it with a cough.  Residue more significant with thickened textures; pt also does not follow commands well for successful use of strategies. Coughing without penetration/aspiration also occurred, possibly related to appearance of stasis in the mid to upper esophagus on esophageal sweep. Given low risk of aspiration despite impairments, recommend pt continue a dys 3/thin liquid diet will f/u for tolerance.  SLP Visit Diagnosis Dysphagia, oropharyngeal phase (R13.12) Attention and concentration deficit following -- Frontal lobe and executive function deficit following -- Impact on safety and function --   CHL IP TREATMENT RECOMMENDATION 12/11/2017 Treatment Recommendations Defer treatment plan to f/u with SLP   No flowsheet data found. CHL IP DIET RECOMMENDATION 12/11/2017 SLP Diet Recommendations Dysphagia 3 (Mech soft) solids;Thin liquid Liquid Administration via Cup;Straw Medication Administration Whole meds with puree Compensations Minimize environmental distractions Postural Changes Remain semi-upright after after feeds/meals (Comment)   CHL IP OTHER RECOMMENDATIONS 12/11/2017 Recommended Consults -- Oral Care Recommendations Oral care BID Other Recommendations --   CHL IP FOLLOW UP RECOMMENDATIONS 12/11/2017 Follow up Recommendations Skilled Nursing facility   [facility] IP FREQUENCY AND DURATION 12/11/2017 Speech Therapy Frequency (ACUTE ONLY) min 2x/week Treatment Duration 2 weeks      CHL IP ORAL PHASE 12/11/2017 Oral Phase Impaired Oral -  Pudding Teaspoon -- Oral - Pudding Cup -- Oral - Honey Teaspoon -- Oral - Honey Cup -- Oral - Nectar Teaspoon -- Oral - Nectar Cup Decreased bolus cohesion Oral - Nectar Straw -- Oral - Thin Teaspoon -- Oral - Thin Cup WFL Oral - Thin Straw WFL Oral - Puree Decreased bolus cohesion Oral - Mech Soft -- Oral - Regular Decreased bolus cohesion Oral - Multi-Consistency -- Oral - Pill -- Oral Phase - Comment --  CHL IP PHARYNGEAL PHASE 12/11/2017 Pharyngeal Phase Impaired Pharyngeal- Pudding Teaspoon -- Pharyngeal -- Pharyngeal- Pudding Cup -- Pharyngeal -- Pharyngeal- Honey Teaspoon -- Pharyngeal -- Pharyngeal- Honey Cup -- Pharyngeal -- Pharyngeal- Nectar Teaspoon -- Pharyngeal -- Pharyngeal- Nectar Cup Pharyngeal residue - valleculae Pharyngeal -- Pharyngeal- Nectar Straw -- Pharyngeal -- Pharyngeal- Thin Teaspoon -- Pharyngeal -- Pharyngeal- Thin Cup Pharyngeal residue - valleculae;Pharyngeal residue - pyriform;Penetration/Aspiration during swallow;Penetration/Apiration after swallow Pharyngeal Material enters airway, remains ABOVE vocal cords then ejected out;Material does not enter airway Pharyngeal- Thin Straw Pharyngeal residue - valleculae;Pharyngeal residue - pyriform;Trace aspiration;Penetration/Apiration after swallow Pharyngeal Material enters airway, passes BELOW cords then ejected out;Material does not enter airway Pharyngeal- Puree Pharyngeal residue - valleculae;Pharyngeal residue - pyriform Pharyngeal -- Pharyngeal- Mechanical Soft -- Pharyngeal -- Pharyngeal- Regular Pharyngeal  residue - valleculae;Pharyngeal residue - pyriform Pharyngeal -- Pharyngeal- Multi-consistency -- Pharyngeal -- Pharyngeal- Pill -- Pharyngeal -- Pharyngeal Comment --  No flowsheet data found. No flowsheet data found. Herbie Baltimore, [place] [phone] Lynann Beaver 12/11/2017, 2:36 PM                LOS: 2 days   Oren Binet, MD  [facility]  If 7PM-7AM, please contact night-coverage  Please page  via [email or web address]-Password [email or web address]-click on MD name and type text message  12/12/2017, 11:27 AM

## 2017-12-12 NOTE — Consult Note (Signed)
[facility] CM Primary Care Navigator  12/12/2017  Donna Rosario 05-10-1923 [id number]   Met with patient (history of dementia) and daughter (Gwen)at the bedsideto identify possible discharge needs. [facility] facility. Per MD note, patient fell at assisted living facility striking her head that had resultedto this admission.   Patient's daughterstates thatfacility staffprovide carefor herneeds whichincludes dispensing andadministering ofhermedicationsfrom facility pharmacy and facility alsoprovidestransportation to herdoctors' appointments.  According to daughter, patient isbeing seenin the facilityby facility physicianwho is her current primary care provider.  Per MD note, discharge plan is SNF (skilled nursing facility) if she stabilizes, otherwise may need residential hospice. Daughter mentioned hoping that patient will be discharged back to [facility] skilled level of care  (healthcare) when improved and ready.  Primary care provider's office is listed as providing transition of care (TOC).  Nofurther health management needs or concerns noted at this point.    For additional questions please contact:  Edwena Felty A. Yarisbel Miranda, BSN, RN-BC [facility] PRIMARY CARE Navigator Cell: [phone]

## 2017-12-12 NOTE — Clinical Social Work Note (Signed)
Clinical Social Work Assessment  Patient Details  Name: Donna Rosario MRN: [id number] Date of Birth: 11-29-1922  Date of referral:  12/11/17               Reason for consult:  Facility Placement                Permission sought to share information with:  Facility Sport and exercise psychologist, Family Supports Permission granted to share information::  Yes, Verbal Permission Granted  Name::     Passenger transport manager::  [place]  Relationship::  Daughter  Contact Information:     Housing/Transportation Living arrangements for the past 2 months:  [place] of Information:  Adult Children Patient Interpreter Needed:  None Criminal Activity/Legal Involvement Pertinent to Current Situation/Hospitalization:  No - Comment as needed Significant Relationships:  Adult Children Lives with:  Self, Facility Resident Do you feel safe going back to the place where you live?  Yes Need for family participation in patient care:  Yes (Comment)  Care giving concerns:  Patient from [facility] ALF and will likely need to transition to SNF at discharge.   Social Worker assessment / plan:  CSW met with patient's daughter at bedside to discuss discharge plan. CSW confirmed plan to return to [facility] and transition to SNF upon return. CSW to follow up with [facility] admissions.  Employment status:  Retired Forensic scientist:  Medicare PT Recommendations:  [place] / Referral to community resources:  [place]  Patient/Family's Response to care:  Patient's family agreeable to transition to SNF at discharge.  Patient/Family's Understanding of and Emotional Response to Diagnosis, Current Treatment, and Prognosis:  Patient's daughter discussed how the patient has been over to the SNF side of [facility] and she does well over there. Patient's daughter appreciative of CSW assistance.  Emotional Assessment Appearance:  Appears stated  age Attitude/Demeanor/Rapport:  Unable to Assess Affect (typically observed):  Unable to Assess Orientation:  Oriented to Self Alcohol / Substance use:  Not Applicable Psych involvement (Current and /or in the community):  No (Comment)  Discharge Needs  Concerns to be addressed:  Care Coordination Readmission within the last 30 days:  No Current discharge risk:  Physical Impairment, Dependent with Mobility, Cognitively Impaired Barriers to Discharge:  Continued Medical Work up, [place], [place] 12/12/2017, 3:26 PM

## 2017-12-12 NOTE — Plan of Care (Addendum)
Patient stable, discussed POC with patient and daughter Donna Rosario, denies question/concerns at this time.

## 2017-12-12 NOTE — [place] FL2 (Signed)
[place] MEDICAID FL2 LEVEL OF CARE SCREENING TOOL     IDENTIFICATION  Patient Name: Donna Rosario Birthdate: 26-Dec-1922 Sex: female Admission Date (Current Location): 12/09/2017  [facility] and [place] Number:  Engineering geologist and Address:  [facility], [place] [address], [place], [place] [address]      Provider Number: [id number]  Attending Physician Name and Address:  Jonetta Osgood, MD  Relative Name and Phone Number:       Current Level of Care: Hospital Recommended Level of Care: [place] Prior Approval Number:    Date Approved/Denied:   PASRR Number: [id number]  Discharge Plan: SNF    Current Diagnoses: Patient Active Problem List   Diagnosis Date Noted  . Subdural hematoma, acute ([place]) 12/11/2017  . Subdural hematoma ([place]) 11/30/2017  . 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 Osteoarthritis 04/24/2017  . History of stroke 04/24/2017  . HLD (hyperlipidemia) 04/24/2017  . Osteoporosis 04/24/2017  . Hypothyroidism 05/30/2015  . Chronic diastolic CHF (congestive heart failure) ([place]) 08/10/2013  . Dementia 03/09/2011  . HYPERTENSION, BENIGN 03/30/2010  . Chronic atrial fibrillation ([place]) 03/30/2010    Orientation RESPIRATION BLADDER Height & Weight     Self  Normal Incontinent Weight: 134 lb 14.7 oz (61.2 kg) Height:  5\' 3"  (160 cm)  BEHAVIORAL SYMPTOMS/MOOD NEUROLOGICAL BOWEL NUTRITION STATUS      Continent Diet(see DC summary)  AMBULATORY STATUS COMMUNICATION OF NEEDS Skin   Extensive Assist Verbally Normal                       Personal Care Assistance Level of Assistance  Bathing, Feeding, Dressing Bathing Assistance: Maximum assistance Feeding assistance: Maximum assistance Dressing Assistance: Maximum assistance     Functional Limitations Info  Sight, Hearing, Speech Sight Info: Adequate Hearing Info: Adequate Speech Info: Adequate    SPECIAL  CARE FACTORS FREQUENCY  OT (By licensed OT), PT (By licensed PT)     PT Frequency: 5x/wk OT Frequency: 5x/wk            Contractures Contractures Info: Not present    Additional Factors Info  Code Status, Allergies, Psychotropic Code Status Info: DNR Allergies Info: Sulfa Antibiotics Psychotropic Info: Risperdal 0.25 mg daily at bedtime         Current Medications (12/12/2017):  This is the current hospital active medication list Current Facility-Administered Medications  Medication Dose Route Frequency Provider Last Rate Last Dose  . acetaminophen (TYLENOL) tablet 650 mg  650 mg Oral Q4H PRN Rise Patience, MD      . albuterol (PROVENTIL) (2.5 MG/3ML) 0.083% nebulizer solution 2.5 mg  2.5 mg Nebulization Q2H PRN Jonetta Osgood, MD      . atorvastatin (LIPITOR) tablet 40 mg  40 mg Oral q1800 Rise Patience, MD   40 mg at 12/11/17 1747  . calcium carbonate (TUMS - dosed in mg elemental calcium) chewable tablet 400 mg of elemental calcium  2 tablet Oral Daily Rise Patience, MD   400 mg of elemental calcium at 12/12/17 0911  . dextrose 5 %-0.9 % sodium chloride infusion   Intravenous Continuous Knox Royalty, NP      . furosemide (LASIX) tablet 40 mg  40 mg Oral Daily Rise Patience, MD   40 mg at 12/12/17 0911  . haloperidol lactate (HALDOL) injection 1 mg  1 mg Intravenous Q6H PRN Knox Royalty, NP      .  hydrALAZINE (APRESOLINE) injection 10 mg  10 mg Intravenous Q4H PRN Rise Patience, MD   10 mg at 12/11/17 1415  . ipratropium-albuterol (DUONEB) 0.5-2.5 (3) MG/3ML nebulizer solution 3 mL  3 mL Nebulization BID Ghimire, Shanker M, MD      . lisinopril (PRINIVIL,ZESTRIL) tablet 40 mg  40 mg Oral Daily Rise Patience, MD   40 mg at 12/12/17 0911  . Melatonin TABS 3 mg  3 mg Oral QHS Knox Royalty, NP      . metoprolol tartrate (LOPRESSOR) tablet 50 mg  50 mg Oral BID Jonetta Osgood, MD   50 mg at 12/12/17 0911  . omega-3 acid ethyl  esters (LOVAZA) capsule 1 g  1 g Oral Daily Rise Patience, MD   1 g at 12/12/17 0912  . ondansetron (ZOFRAN) tablet 4 mg  4 mg Oral Q6H PRN Rise Patience, MD       Or  . ondansetron [facility]) injection 4 mg  4 mg Intravenous Q6H PRN Rise Patience, MD      . piperacillin-tazobactam (ZOSYN) IVPB 3.375 g  3.375 g Intravenous Q8H Bajbus, Lauren D, RPH   Stopped at 12/12/17 1300  . RESOURCE THICKENUP CLEAR   Oral PRN Jonetta Osgood, MD      . risperiDONE (RISPERDAL) tablet 0.25 mg  0.25 mg Oral QHS Ghimire, Henreitta Leber, MD      . thyroid (ARMOUR) tablet 60 mg  60 mg Oral QAC breakfast Rise Patience, MD   60 mg at 12/12/17 0820     Discharge Medications: Please see discharge summary for a list of discharge medications.  Relevant Imaging Results:  Relevant Lab Results:   Additional Information SS#: [id number]  Geralynn Ochs, LCSW

## 2017-12-12 NOTE — Consult Note (Signed)
Consultation Note Date: 12/12/2017   Patient Name: Donna Rosario  DOB: 05-28-1923  MRN: [id number]  Age / Sex: 84 y.o., female  PCP: Venia Carbon, MD Referring Physician: Jonetta Osgood, MD  Reason for Consultation: Establishing goals of care and Psychosocial/spiritual support  HPI/Patient Profile: 84 y.o. female   admitted on 12/09/2017 with a past medcial history of embolic CVA, atrial fibrillation, hypertension, hypothyroidism, mild dementia was brought to the ER after patient had an unwitnessed fall.   Patient lives in assisted living facility/[facility].   Patient was [facility] the ER at [facility].  Patient after the fall had sustained a scalp hematoma.    CT head shows acute on chronic subdural hematoma.  On-call neurosurgeon Dr. Ronnald Ramp was consulted at [facility] and patient transferred to [facility] for further management.   Family face treatment option decisions, advanced directive decisions, and anticipatory care needs.  Clinical Assessment and Goals of Care:  This NP Wadie Lessen reviewed medical records, received report from team, assessed the patient and then meet at the patient's bedside along with her daughter Donna Rosario to discuss diagnosis, prognosis, [place], EOL wishes disposition and options.  Concepts/benefits of Hospice and Palliative Care were discussed.  A  discussion was had today regarding advanced directives.  Concepts specific to code status, artifical feeding and hydration, continued IV antibiotics and rehospitalization was had.  The difference between a aggressive medical intervention path  and a palliative comfort care path for this patient at this time was had.  Values and goals of care important to patient and family were attempted to be elicited.  MOST form introduced  Daughter tells me that her mother was walking to the dining hall with  her walker, socially engaged and "doing quite well" prior to this fall.  Because of her baseline daughter feels that it is important to "give this a little more time" to see if the patient will rebound.  Daughter understands the seriousness of the situation, and if her mother does not begin to show signs of improvement, over the next 2-3 days, she will be open to a comfort path and open to hospice services at the facility.   Natural trajectory and expectations at EOL were discussed.  Questions and concerns addressed.   Family encouraged to call with questions or concerns.    PMT will continue to support holistically.  HCPOA- daughter and son ( he is coming from out of town tomorrow)    [place] Planning:  DNR    Symptom Management:   Agitation:  Family reports extreme sensitively to medication specifically sedatives.  Attempt all nursing interventions to reduce agitation prior to utilization of low dose IV Haldol.   Discussed with nursing    Palliative Prophylaxis:   Aspiration, Bowel Regimen, Delirium Protocol, Frequent Pain Assessment and Oral Care  Additional Recommendations (Limitations, Scope, Preferences):  Full Scope Treatment for trial period of 2-3 days  I shared with daughter and  Dr Estell Harpin that I will be out of the hospital until Monday.  I have scheduled a meeting with daughter for Monday at 2:00.  Please call team phone in [place] with any needs thru the week-end  Psycho-social/Spiritual:   Desire for further Chaplaincy support:no declines at this time   Prognosis:   Unable to determine- long term prognosis is poor with acute on chronic hematomas  Discharge Planning: To Be Determined      Primary Diagnoses: Present on Admission: . Chronic atrial fibrillation ([place]) . Chronic diastolic CHF (congestive heart failure) ([place]) . HYPERTENSION, BENIGN . Subdural hematoma, acute ([place]) . Hypothyroidism . HLD  (hyperlipidemia) . Subdural hematoma ([place])   I have reviewed the medical record, interviewed the patient and family, and examined the patient. The following aspects are pertinent.  Past Medical History:  Diagnosis Date  . Arthritis   . Atrial fibrillation [facility]) Sept. 2011   [facility] Admission for RVR  . Chronic low back pain    due to lumbosacral degenerative joint disease  . Disturbance of skin sensation   . Embolic stroke ([place]) 72/5366  . Hyperlipidemia   . Hypertension   . Mitral valve disorders(424.0)   . Osteoporosis   . Sciatica   . Thyroid disease    Hypothyroidism  . Vascular dementia, uncomplicated    Social History   Socioeconomic History  . Marital status: Widowed    Spouse name: Not on file  . Number of children: 2  . Years of education: Not on file  . Highest education level: Not on file  Occupational History  . Occupation: Youth worker  Social Needs  . Financial resource strain: Not on file  . Food insecurity:    Worry: Not on file    Inability: Not on file  . Transportation needs:    Medical: Not on file    Non-medical: Not on file  Tobacco Use  . Smoking status: Never Smoker  . Smokeless tobacco: Never Used  Substance and Sexual Activity  . Alcohol use: Yes    Alcohol/week: 3.0 oz    Types: 5 Glasses of wine per week    Comment: occasional  . Drug use: No  . Sexual activity: Never  Lifestyle  . Physical activity:    Days per week: Not on file    Minutes per session: Not on file  . Stress: Not on file  Relationships  . Social connections:    Talks on phone: Not on file    Gets together: Not on file    Attends religious service: Not on file    Active member of club or organization: Not on file    Attends meetings of clubs or organizations: Not on file    Relationship status: Not on file  Other Topics Concern  . Not on file  Social History Narrative   Has living will   Son or daughter should make health care decisions    Will accept resuscitation   Family History  Problem Relation Age of Onset  . Other Mother        CHF  . Osteoporosis Mother   . Diabetes Mother   . Other Father        unknown  . Osteoporosis Father   . Heart disease Father    Scheduled Meds: . atorvastatin  40 mg Oral q1800  . calcium carbonate  2 tablet Oral Daily  . furosemide  40 mg Oral Daily  . ipratropium-albuterol  3 mL Nebulization BID  .  lisinopril  40 mg Oral Daily  . metoprolol tartrate  50 mg Oral BID  . omega-3 acid ethyl esters  1 g Oral Daily  . risperiDONE  0.25 mg Oral QHS  . thyroid  60 mg Oral QAC breakfast   Continuous Infusions: . sodium chloride    . piperacillin-tazobactam (ZOSYN)  IV 3.375 g (12/12/17 0513)  . potassium chloride 10 mEq (12/12/17 1315)   PRN Meds:.acetaminophen, albuterol, haloperidol lactate, hydrALAZINE, Melatonin, ondansetron **OR** ondansetron (ZOFRAN) IV, RESOURCE THICKENUP CLEAR Medications Prior to Admission:  Prior to Admission medications   Medication Sig Start Date End Date Taking? Authorizing Provider  acetaminophen (TYLENOL) 325 MG tablet Take 325-650 mg by mouth every 4 (four) hours as needed for mild pain or fever.   Yes [provider]  alendronate (FOSAMAX) 70 MG tablet Take 1 tablet (70 mg total) by mouth once a week. Take with a full glass of water on an empty stomach. 07/28/16  Yes Crecencio Mc, MD  apixaban (ELIQUIS) 5 MG TABS tablet Take 1 tablet (5 mg total) by mouth 2 (two) times daily. 01/10/17  Yes Venia Carbon, MD  ARMOUR THYROID 60 MG tablet TAKE 1 TABLET (60 MG TOTAL) BY MOUTH DAILY. 03/09/14  Yes Crecencio Mc, MD  Ascorbic Acid (VITAMIN C) 500 MG tablet Take 500 mg by mouth daily.     Yes [provider]  atorvastatin (LIPITOR) 40 MG tablet Take 1 tablet (40 mg total) by mouth daily at 6 PM. 08/07/16  Yes Gladstone Lighter, MD  benzocaine (ORAJEL) 10 % mucosal gel Use as directed 1 application in the mouth or throat as needed (lip  blisters).   Yes [provider]  bismuth subsalicylate (KAOPECTATE) 262 MG/15ML suspension Take 10 mLs by mouth every 6 (six) hours as needed for diarrhea or loose stools.   Yes [provider]  calcium carbonate (TUMS - DOSED IN MG ELEMENTAL CALCIUM) 500 MG chewable tablet Chew 2 tablets by mouth daily.   Yes [provider]  carbamide peroxide (DEBROX) 6.5 % OTIC solution 5 drops See admin instructions. In affected ear daily for 5 days then irrigate. Use as needed for impaction.   Yes [provider]  Cholecalciferol (VITAMIN D3) 10000 units TABS Take 50,000 Units by mouth See admin instructions. Once monthly on the 20th of each month.   Yes [provider]  dextromethorphan-guaiFENesin (ROBITUSSIN-DM) 10-100 MG/5ML liquid Take 10 mLs by mouth every 4 (four) hours as needed for cough.   Yes [provider]  diphenhydrAMINE (BENADRYL) 25 mg capsule Take 25 mg by mouth every 6 (six) hours as needed for itching (or allergic reaction).   Yes [provider]  fish oil-omega-3 fatty acids 1000 MG capsule Take 1 g by mouth daily.     Yes [provider]  furosemide (LASIX) 40 MG tablet Take 40 mg by mouth.   Yes [provider]  hydroxypropyl methylcellulose / hypromellose (ISOPTO TEARS / GONIOVISC) 2.5 % ophthalmic solution Place 1 drop into both eyes every 2 (two) hours as needed for dry eyes.   Yes [provider]  L-Lysine 500 MG TABS Take 1 tablet by mouth daily.     Yes [provider]  lisinopril (PRINIVIL,ZESTRIL) 40 MG tablet Take 1 tablet (40 mg total) by mouth daily. 03/25/17 12/11/17 Yes Gollan, Kathlene November, MD  magnesium hydroxide (MILK OF MAGNESIA) 400 MG/5ML suspension Take 30 mLs by mouth daily as needed for mild constipation.   Yes [provider]  metoprolol tartrate (LOPRESSOR) 25 MG tablet Take 1 tablet (25 mg total) by mouth 2 (two) times daily. 03/25/17 12/11/17 Yes Gollan, Kathlene November,  MD  nystatin (MYCOSTATIN/NYSTOP) powder Apply 1 g topically 2 (two) times daily as needed (Under breasts, groin, abdominal folds for rash).   Yes [provider]  ondansetron (ZOFRAN) 4 MG tablet Take 4 mg by mouth every 8 (eight) hours as needed for nausea or vomiting.   Yes [provider]  Selenium 200 MCG TABS Take 1 tablet by mouth daily.     Yes [provider]   Allergies  Allergen Reactions  . Sulfa Antibiotics Hives   Review of Systems  Unable to perform ROS   Physical Exam  Constitutional: She appears well-developed.  Cardiovascular: Normal rate, regular rhythm and normal heart sounds.  Pulmonary/Chest: Effort normal.  Abdominal: Soft.  Skin: Skin is warm and dry.    Vital Signs: BP (!) 160/104 (BP Location: Right Arm)   Pulse 72   Temp 97.9 F (36.6 C) (Oral)   Resp 20   Ht 5\' 3"  (1.6 m)   Wt 61.2 kg (134 lb 14.7 oz)   SpO2 97%   BMI 23.90 kg/m  Pain Scale: 0-10   Pain Score: 0-No pain   SpO2: SpO2: 97 % O2 Device:SpO2: 97 % O2 Flow Rate: .   IO: Intake/output summary:   Intake/Output Summary (Last 24 hours) at 12/12/2017 1327 Last data filed at 12/12/2017 0515 Gross per 24 hour  Intake 320 ml  Output 750 ml  Net -430 ml    LBM: Last BM Date: 12/11/17 Baseline Weight: Weight: 61.2 kg (134 lb 14.7 oz) Most recent weight: Weight: 61.2 kg (134 lb 14.7 oz)     Palliative Assessment/Data:   PTA 70% currently 30%   Discussed with Dr Sloan Leiter  Time In: 1315 Time Out: 1430 Time Total: 75 minutes Greater than 50%  of this time was spent counseling and coordinating care related to the above assessment and plan.  Signed by: Wadie Lessen, NP   Please contact Palliative Medicine Team phone at [phone] for questions and concerns.  For individual provider: See Shea Evans

## 2017-12-12 NOTE — Progress Notes (Addendum)
Physical Therapy Treatment Patient Details Name: Donna Rosario MRN: [id number] DOB: 1923-05-14 Today's Date: 12/12/2017    History of Present Illness Donna Rosario is a 84 y.o. female with history of embolic CVA, atrial fibrillation, hypertension, hypothyroidism, dementia was brought to the ER after patient had an unwitnessed fall with scalp hematoma; Patient lives in assisted living facility. Sustained an Acute on chronic subdural hematoma status post fall.     PT Comments    Treatment limited due to patient marked altered mental status compared to yesterday. In today's session, patient was alert, but unable to follow simple commands despite multimodal cues. Able to progress to sitting EOB with min assist. However, once sitting, patient daughter requested patient be returned to bed due to lack of therapeutic value of mobility with patient's current mental status. Rest of session involved discussing discharge plan with patient daughter. Patient daughter states that patient was independent with RW at [facility] prior to admission but had history of multiple falls. Pt daughter agreeable for higher level care at discharge at [facility] (which she stated was a ST rehab). Recommending this based on patient being at high risk for falls.   Follow Up Recommendations  SNF;Supervision/Assistance - 24 hour;Other (comment)(([facility]care at [facility]))     Equipment Recommendations  Rolling walker with 5" wheels;3in1 (PT)    Recommendations for Other Services OT consult     Precautions / Restrictions Precautions Precautions: Fall Restrictions Weight Bearing Restrictions: No    Mobility  Bed Mobility Overal bed mobility: Needs Assistance Bed Mobility: Supine to Sit     Supine to sit: Min assist     General bed mobility comments: Handheld assist to pull up to sit  Transfers                    Ambulation/Gait                 Stairs              Wheelchair Mobility    Modified Rankin (Stroke Patients Only) Modified Rankin (Stroke Patients Only) Pre-Morbid Rankin Score: Slight disability Modified Rankin: Moderately severe disability     Balance Overall balance assessment: Needs assistance Sitting-balance support: Bilateral upper extremity supported Sitting balance-Leahy Scale: Fair       Standing balance-Leahy Scale: Poor                              Cognition Arousal/Alertness: Suspect due to medications Behavior During Therapy: Restless Overall Cognitive Status: Impaired/Different from baseline Area of Impairment: Following commands;Safety/judgement;Orientation                 Orientation Level: Disoriented to;Person;Place;Time;Situation     Following Commands: Follows one step commands inconsistently Safety/Judgement: Decreased awareness of deficits;Decreased awareness of safety     General Comments: Patient with history of dementia per daughter but patient with marked altered mental status this session. Unable to follow simple commands or answer questions appropriately. Mumbling statements such as "he goes to the black, I go to the red." RN aware.      Exercises      General Comments        Pertinent Vitals/Pain Pain Assessment: Faces Faces Pain Scale: No hurt    Home Living                      Prior Function  PT Goals (current goals can now be found in the care plan section) Acute Rehab PT Goals PT Goal Formulation: Patient unable to participate in goal setting Time For Goal Achievement: 12/25/17 Potential to Achieve Goals: Fair    Frequency    Min 2X/week      PT Plan Frequency needs to be updated    Co-evaluation              AM-PAC PT "6 Clicks" Daily Activity  Outcome Measure  Difficulty turning over in bed (including adjusting bedclothes, sheets and blankets)?: A Little Difficulty moving from lying on back to sitting on the  side of the bed? : A Lot Difficulty sitting down on and standing up from a chair with arms (e.g., wheelchair, bedside commode, etc,.)?: Unable Help needed moving to and from a bed to chair (including a wheelchair)?: A Little Help needed walking in hospital room?: A Lot Help needed climbing 3-5 steps with a railing? : A Lot 6 Click Score: 13    End of Session Equipment Utilized During Treatment: Gait belt Activity Tolerance: Patient limited by lethargy Patient left: with call bell/phone within reach;in bed;with bed alarm set;with family/visitor present Nurse Communication: Mobility status PT Visit Diagnosis: Unsteadiness on feet (R26.81);Other abnormalities of gait and mobility (R26.89);History of falling (Z91.81)     Time: 0935-1000 PT Time Calculation (min) (ACUTE ONLY): 25 min  Charges:  $Therapeutic Activity: 23-37 mins                    G Codes:       Ellamae Sia, PT, DPT Acute Rehabilitation Services  Pager: [phone]   Willy Eddy 12/12/2017, 10:25 AM

## 2017-12-12 NOTE — Progress Notes (Addendum)
  Speech Language Pathology Treatment: Dysphagia  Patient Details Name: Donna Rosario MRN: [id number] DOB: 20-Mar-1923 Today's Date: 12/12/2017 Time: 8916-9450 SLP Time Calculation (min) (ACUTE ONLY): 19 min  Assessment / Plan / Recommendation Clinical Impression  Pts function significantly worsened since yesterday. Pt is alert, but more confused, unable to follow commands, unable to self feed, needs max cues to recognize PO. Pt is more dysarthric, with respiratory sounds suggestive of more flaccid oropharynx.  Oral residue and holding noted, expect severe pharyngeal residuals now. Given worsened mentation and weakness, expect high risk of aspiration with all textures as pharyngeal residue with puree and thickened liquids is expected, though immediate aspiration also appears to occur with thin. No PO are "safe" but given plan of care focused on comfort rather than aggressive treatment, will downgrade diet to allow PO and meds as needed with the easiest method of intake. Will change diet to puree and nectar thick via teaspoon with suction needed to clear oral cavity intermittently.     HPI HPI: Donna Rosario is a 84 y.o. female with history of embolic CVA, atrial fibrillation, hypertension, hypothyroidism, dementia was brought to the ER after patient had an unwitnessed fall.  Patient lives in assisted living facility.  Patient was brought to the ER at [facility] [place].  Patient after the fall had sustained a scalp hematoma.  CT head shows acute on chronic subdural hematoma.  On-call neurosurgeon Dr. Ronnald Ramp was consulted at [facility] and patient transferred to [facility] for further management.  Patient has baseline dementia. CXR shows Cardiomegaly with vascular congestion, bibasilar atelectasis and probable small effusions.      SLP Plan  Continue with current plan of care       Recommendations  Diet recommendations: Dysphagia 1 (puree);Nectar-thick liquid Liquids provided  via: Cup;Teaspoon Medication Administration: Whole meds with liquid Compensations: Minimize environmental distractions;Slow rate;Small sips/bites;Clear throat intermittently;Multiple dry swallows after each bite/sip                Oral Care Recommendations: Oral care BID Follow up Recommendations: Skilled Nursing facility SLP Visit Diagnosis: Dysphagia, oropharyngeal phase (R13.12) Plan: Continue with current plan of care       GO               [facility], [place] CCC-SLP [phone]  Donna Rosario 12/12/2017, 9:22 AM

## 2017-12-13 DIAGNOSIS — J690 Pneumonitis due to inhalation of food and vomit: Secondary | ICD-10-CM

## 2017-12-13 DIAGNOSIS — R569 Unspecified convulsions: Secondary | ICD-10-CM

## 2017-12-13 MED ORDER — MORPHINE BOLUS VIA INFUSION
1.0000 mg | Freq: Once | INTRAVENOUS | Status: AC
  Administered 2017-12-13: 1 mg via INTRAVENOUS
  Filled 2017-12-13: qty 1

## 2017-12-13 MED ORDER — ACETAMINOPHEN 650 MG RE SUPP: 650.0000 mg | Freq: Four times a day (QID) | RECTAL | Status: DC | PRN

## 2017-12-13 MED ORDER — MORPHINE SULFATE (CONCENTRATE) 10 MG/0.5ML PO SOLN: 5.0000 mg | ORAL | Status: DC | PRN

## 2017-12-13 MED ORDER — LORAZEPAM 2 MG/ML IJ SOLN
1.0000 mg | INTRAMUSCULAR | Status: DC | PRN
  Administered 2017-12-13 – 2017-12-14 (×2): 1 mg via INTRAVENOUS
  Filled 2017-12-13 (×2): qty 1

## 2017-12-13 MED ORDER — MORPHINE BOLUS VIA INFUSION
1.0000 mg | INTRAVENOUS | Status: DC | PRN
  Administered 2017-12-13 – 2017-12-14 (×4): 1 mg via INTRAVENOUS
  Filled 2017-12-13: qty 1

## 2017-12-13 MED ORDER — MORPHINE 100MG IN NS 100ML (1MG/ML) PREMIX INFUSION
1.0000 mg/h | INTRAVENOUS | Status: DC
  Administered 2017-12-13 (×2): 1 mg/h via INTRAVENOUS
  Filled 2017-12-13: qty 100

## 2017-12-13 MED ORDER — ACETAMINOPHEN 325 MG PO TABS: 650.0000 mg | ORAL_TABLET | Freq: Four times a day (QID) | ORAL | Status: DC | PRN

## 2017-12-13 MED ORDER — LORAZEPAM 2 MG/ML IJ SOLN
1.0000 mg | INTRAMUSCULAR | Status: DC | PRN
  Administered 2017-12-13: 1 mg via INTRAVENOUS
  Filled 2017-12-13: qty 1

## 2017-12-13 MED ORDER — LORAZEPAM 2 MG/ML IJ SOLN: 2.0000 mg | INTRAMUSCULAR | Status: DC | PRN

## 2017-12-13 MED ORDER — GLYCOPYRROLATE 0.2 MG/ML IJ SOLN
0.2000 mg | INTRAMUSCULAR | Status: DC | PRN
  Administered 2017-12-14: 0.2 mg via INTRAVENOUS
  Filled 2017-12-13: qty 1

## 2017-12-13 MED ORDER — GLYCOPYRROLATE 0.2 MG/ML IJ SOLN: 0.2000 mg | INTRAMUSCULAR | Status: DC | PRN

## 2017-12-13 MED ORDER — MORPHINE SULFATE (PF) 2 MG/ML IV SOLN
1.0000 mg | INTRAVENOUS | Status: DC | PRN
  Administered 2017-12-13: 1 mg via INTRAVENOUS
  Filled 2017-12-13: qty 1

## 2017-12-13 MED ORDER — GLYCOPYRROLATE 1 MG PO TABS
1.0000 mg | ORAL_TABLET | ORAL | Status: DC | PRN
  Filled 2017-12-13: qty 1

## 2017-12-13 NOTE — Progress Notes (Signed)
PT Cancellation Note  Patient Details Name: Donna Rosario MRN: [id number] DOB: 1922-07-07   Cancelled Treatment:    Reason Eval/Treat Not Completed: Medical issues which prohibited therapy Per chart review rapid response called over night; PT speaking with nursing - patient is now comfort care/Hospice. Cancel due to medical issues.    Lanney Gins, PT, DPT 12/13/17 12:12 PM

## 2017-12-13 NOTE — Progress Notes (Signed)
Called daughter, Meredith Mody, updating her on events of the night. Gwen's phone # is on the white board. She appreciated the call.

## 2017-12-13 NOTE — Significant Event (Addendum)
Rapid Response Event Note  Overview: Neurologic   Initial Focused Assessment: Called by RNs about patient having frank bleeding from the mouth. Per staff, patient was also shaking and appeared to be having a seizure.  I instructed the RN to page the NP. I also asked the nurse have suction set up and keep HOB > 30,  Upon arrival, patient was resting, did respond to painful stimuli, pupils reactive bilateral, generalized rhythmic motion noted. Patient did talk, stated that her tongue hurt, + Tongue laceration - bright red blood present. Lung sounds were clear, not in distress.  VSS  Interventions: - Gentle oral suctioning and mouth care  Plan of Care (if not transferred): - RN to page primary provider for orders  Event Summary:   at    Call Time 300 Arrival Time [place], [place]

## 2017-12-13 NOTE — Progress Notes (Signed)
Rapid response in room. Paged On Call for an order of ativan. Will continue to monitor.

## 2017-12-13 NOTE — Care Management Note (Signed)
Case Management Note  Patient Details  Name: Aliea Bobe MRN: [id number] Date of Birth: 05/09/1923  Subjective/Objective:     Pt admitted with subdural hematoma. She is from [facility].                Action/Plan: Plan is for residential hospice. CM following.  Expected Discharge Date:                  Expected Discharge Plan:  [place]  In-House Referral:  Clinical Social Work  Discharge planning Services     Post Acute Care Choice:    Choice offered to:     DME Arranged:    DME Agency:     HH Arranged:    [place] Agency:     Status of Service:  In process, will continue to follow  If discussed at Long Length of Stay Meetings, dates discussed:    Additional Comments:  Pollie Friar, RN 12/13/2017, 11:13 AM

## 2017-12-13 NOTE — Care Management Important Message (Signed)
Important Message  Patient Details  Name: Donna Rosario MRN: [id number] Date of Birth: 1923/01/04   Medicare Important Message Given:  Yes    [name] Jarid Sasso 12/13/2017, 12:42 PM

## 2017-12-13 NOTE — Progress Notes (Signed)
OT Cancellation Note  Patient Details Name: Donna Rosario MRN: [id number] DOB: 03-02-1923   Cancelled Treatment:    Reason Eval/Treat Not Completed: Medical issues which prohibited therapy. Pt having rapid response on 6/20 and is not on comfort care/hospice. OT evaluation cancelled secondary to pt not being appropriate at this time. Thank you for the referral.   Gypsy Decant 12/13/2017, 12:39 PM

## 2017-12-13 NOTE — Progress Notes (Addendum)
PROGRESS NOTE        PATIENT DETAILS Name: Donna Rosario Age: 84 y.o. Sex: female Date of Birth: February 16, 1923 Admit Date: 12/12/2017 Admitting Physician Samuella Cota, MD IEP:PIRJJO, Theophilus Kinds, MD  Brief Narrative: Patient is a 84 y.o. female with history of chronic atrial fibrillation on anticoagulation, dementia, chronic diastolic heart failure presented from a assisted living facility secondary to a mechanical fall, she was found to have acute on chronic subdural hematoma, anticoagulation was reversed with Kcentra.  Patient was transferred to [facility] for neurosurgery evaluation.  Subsequent hospital course complicated by aspiration pneumonia causing respiratory failure, and lately by worsening encephalopathy and one episode of seizures.  See below for further details.  Subjective: Seen multiple times this morning-she is unresponsive.  Apparently had a seizure around 4:00 this morning.  Have had numerous discussion with the patient's daughter at bedside.  She has markedly declined since this morning-this afternoon she is having periods of apnea alternating with tachypneic episodes.  Assessment/Plan: Acute on chronic subdural hematoma: No longer on Eliquis-evaluated by neurosurgery not a candidate for craniotomy.    Fall resulting in scalp laceration and subdural hematoma: Laceration was sutured in the emergency room-continue with supportive care.    Acute hypoxic respiratory failure secondary to aspiration pneumonia: Has continued to worsen during this hospital stay-still continues to accumulate secretions-low-grade fever this afternoon.  After extensive discussion with the patient's daughter by this MD today-given progressive encephalopathy, seizure earlier this morning-and continued clinical decline-we have transitioned her to comfort measures.  No role for any further antimicrobial therapy.  Seizure: Occurred earlier this morning-has been  unresponsive since then.  Long discussion with the patient's daughter at bedside-explained that we potentially could work this seizure and start antiepileptics-but given her overall tenous situation-it probably would not change outcome-she agreed-we have subsequently started on Ativan-and have transitioned to full comfort measures.  Acute Metabolic encephalopathy: Unresponsive-does not respond to painful or verbal stimuli.  Not sure if she is having nonconvulsive seizures is just encephalopathic from hypoxia/worsening respiratory failure.  As noted above-she is very fragile-her overall situation is very tenuous-do not think that further work-up will change outcome here.  After extensive discussion with family-we have transitioned her to full comfort measures.  Chronic atrial fibrillation: Stop all rate control medications-as transition to full comfort measures.  Chronic diastolic heart failure: Volume status is stable-have discontinued diuretics.  Hypothyroidism: Discontinue Armour Thyroid  Hypertension: Stop all antihypertensives-full comfort measures in effect.  Dyslipidemia: Stop statin  Dementia with delirium: Agitated and confused-did receive low-dose Risperdal-now with seizures and significantly worsening encephalopathy.  See above.    Advanced directives/palliative care: DNR in place.  Unfortunate 84 year old dementia-who presented with a subdural hematoma-subsequently developed aspiration pneumonia and respiratory failure, now lately with decreased responsiveness and seizures.  Very poor prognosis-further work-up will not likely change outcome-as she is actively dying.  I have had extensive discussions with the patient's daughter over the past few days-we will already contemplating transitioning to hospice care at some point-I spoke to her again at bedside-given overnight events-we have agreed to transition her to full comfort measures.  She has rapidly deteriorated-she is very tachypneic,  persistently encephalopathic-and is now having apneic spells.  Will start her on a morphine infusion-hold off on consulting residential hospice-she may likely expire soon.  Telemetry: Sinus rhythm  DVT Prophylaxis: SCD's  Code Status:  DNR  Family Communication: Daughter at bedside  Disposition Plan: Remain inpatient-likely inpatient death within hours to days.  Antimicrobial agents: Anti-infectives (From admission, onward)   Start     Dose/Rate Route Frequency Ordered Stop   12/11/17 1530  piperacillin-tazobactam (ZOSYN) IVPB 3.375 g  Status:  Discontinued     3.375 g 12.5 mL/hr over 240 Minutes Intravenous Every 8 hours 12/11/17 1518 12/13/17 1129      Procedures: None  CONSULTS: Neurosurgery  Time spent: Around 40 minutes- Greater than 50% of this time was spent in counseling, explanation of diagnosis, planning of further management, and coordination of care.  MEDICATIONS: Scheduled Meds:  Continuous Infusions: . dextrose 5 % and 0.9% NaCl 50 mL/hr at 12/13/17 0613   PRN Meds:.acetaminophen **OR** acetaminophen, glycopyrrolate **OR** glycopyrrolate **OR** glycopyrrolate, haloperidol lactate, LORazepam, morphine injection, morphine CONCENTRATE **OR** morphine CONCENTRATE, ondansetron **OR** ondansetron (ZOFRAN) IV   PHYSICAL EXAM: Vital signs: Vitals:   12/13/17 0320 12/13/17 0322 12/13/17 0359 12/13/17 0800  BP: (!) 211/127 (!) 156/101 (!) 135/55 (!) 169/90  Pulse: 92 (!) 123 84 87  Resp: (!) 26 (!) 44 (!) 24 (!) 22  Temp:      TempSrc:      SpO2: 94% 94% 97% 97%  Weight:      Height:       Filed Weights   12/21/2017 2021  Weight: 61.2 kg (134 lb 14.7 oz)   Body mass index is 23.9 kg/m.   General appearance: Unresponsive-still accumulating secretions.  Very tachypneic with apneic spells this afternoon. Eyes:no scleral icterus. HEENT: Atraumatic and Normocephalic Neck: supple Resp: With a lot of transmitted upper airway sounds. CVS: S1 S2 regular  tachycardic  GI: Bowel sounds present, Non tender and not distended with no gaurding, rigidity or rebound. Extremities: B/L Lower Ext shows no edema, both legs are warm to touch Neurology: Difficult to evaluate  Musculoskeletal:No digital cyanosis Skin:No Rash, warm and dry Wounds:N/A  I have personally reviewed following labs and imaging studies  LABORATORY DATA: CBC: Recent Labs  Lab 11/26/2017 1249 12/11/17 0609 12/12/17 0639  WBC 16.0* 15.7* 17.4*  NEUTROABS 13.1*  --   --   HGB 13.1 12.3 12.9  HCT 39.7 38.3 39.5  MCV 86.4 87.4 86.6  PLT 257 273 299    Basic Metabolic Panel: Recent Labs  Lab 12/15/2017 1249 12/11/17 0609 12/12/17 0639  NA 136 139 136  K 4.1 3.6 3.2*  CL 104 107 104  CO2 22 25 21*  GLUCOSE 129* 139* 147*  BUN 24* 18 17  CREATININE 0.82 0.86 0.88  CALCIUM 8.4* 8.5* 8.6*    GFR: Estimated Creatinine Clearance: 33 mL/min (by C-G formula based on SCr of 0.88 mg/dL).  Liver Function Tests: Recent Labs  Lab 11/24/2017 1249 12/11/17 0609  AST 46* 30  ALT 32 26  ALKPHOS 121 92  BILITOT 1.3* 1.1  PROT 6.9 6.1*  ALBUMIN 3.3* 2.8*   No results for input(s): LIPASE, AMYLASE in the last 168 hours. No results for input(s): AMMONIA in the last 168 hours.  Coagulation Profile: Recent Labs  Lab 12/04/2017 1319 12/11/17 0609  INR 1.57 1.30    Cardiac Enzymes: Recent Labs  Lab 12/03/2017 1249  TROPONINI <0.03    BNP (last 3 results) No results for input(s): PROBNP in the last 8760 hours.  HbA1C: No results for input(s): HGBA1C in the last 72 hours.  CBG: No results for input(s): GLUCAP in the last 168 hours.  Lipid Profile: No results for input(s):  CHOL, HDL, LDLCALC, TRIG, CHOLHDL, LDLDIRECT in the last 72 hours.  Thyroid Function Tests: No results for input(s): TSH, T4TOTAL, FREET4, T3FREE, THYROIDAB in the last 72 hours.  Anemia Panel: No results for input(s): VITAMINB12, FOLATE, FERRITIN, TIBC, IRON, RETICCTPCT in the last 72  hours.  Urine analysis:    Component Value Date/Time   COLORURINE YELLOW 12/11/2017 1809   APPEARANCEUR CLEAR 12/11/2017 1809   LABSPEC 1.009 12/11/2017 1809   PHURINE 6.0 12/11/2017 1809   GLUCOSEU 50 (A) 12/11/2017 1809   HGBUR SMALL (A) 12/11/2017 1809   BILIRUBINUR NEGATIVE 12/11/2017 1809   KETONESUR NEGATIVE 12/11/2017 1809   PROTEINUR NEGATIVE 12/11/2017 1809   NITRITE NEGATIVE 12/11/2017 1809   LEUKOCYTESUR NEGATIVE 12/11/2017 1809    Sepsis Labs: Lactic Acid, Venous No results found for: LATICACIDVEN  MICROBIOLOGY: No results found for this or any previous visit (from the past 240 hour(s)).  RADIOLOGY STUDIES/RESULTS: Ct Head Wo Contrast  Result Date: 11/23/2017 CLINICAL DATA:  Recent fall with posterior scalp laceration EXAM: CT HEAD WITHOUT CONTRAST TECHNIQUE: Contiguous axial images were obtained from the base of the skull through the vertex without intravenous contrast. COMPARISON:  10/25/17 FINDINGS: Brain: Diffuse atrophic changes and chronic white matter ischemic changes are again noted stable from the prior exam. There is evidence of a right-sided subdural hematoma which appears to have both acute and chronic components. Its maximum thickness is approximately 7 mm. No significant mass effect or midline shift is noted. No other hemorrhage is identified. Vascular: No hyperdense vessel or unexpected calcification. Skull: Normal. Negative for fracture or focal lesion. Sinuses/Orbits: Considerable sinus disease is identified with air-fluid levels within the sphenoid and maxillary sinuses on the left. Other: Scalp hematoma with laceration and surgical staples is noted in the right posterior parietal region. IMPRESSION: Right-sided acute on chronic subdural hematoma as described. Atrophic and ischemic changes. Right scalp hematoma as described. Sinus changes on the left of uncertain chronicity. Air-fluid levels are noted as described. Critical Value/emergent results were called by  telephone at the time of interpretation on 11/25/2017 at 12:30 pm to Dr. Lenise Arena , who verbally acknowledged these results. Electronically Signed   By: Inez Catalina M.D.   On: 12/19/2017 12:35   Dg Chest Port 1 View  Result Date: 12/12/2017 CLINICAL DATA:  Shortness of breath.  Wheezing. EXAM: PORTABLE CHEST 1 VIEW COMPARISON:  12/11/2017. FINDINGS: Mediastinum and hilar structures normal. Stable cardiomegaly. Interim partial clearing of bibasilar atelectasis and small pleural effusions. No pneumothorax. Old left rib fractures. IMPRESSION: 1.  Stable cardiomegaly and mild vascular congestion. 2. Interim partial clearing of bibasilar atelectasis and small pleural effusions. Electronically Signed   By: Marcello Moores  Register   On: 12/12/2017 08:43   Dg Chest Port 1 View  Result Date: 12/11/2017 CLINICAL DATA:  Leukocytosis EXAM: PORTABLE CHEST 1 VIEW COMPARISON:  08/04/2016 FINDINGS: Cardiomegaly. Mild vascular congestion. Bibasilar atelectasis and possible small layering effusions. No acute bony abnormality. IMPRESSION: Cardiomegaly with vascular congestion, bibasilar atelectasis and probable small effusions. Electronically Signed   By: Rolm Baptise M.D.   On: 12/11/2017 08:05   Dg Swallowing Func-speech Pathology  Result Date: 12/11/2017 Objective Swallowing Evaluation: Type of Study: MBS-Modified Barium Swallow Study  Patient Details Name: Donna Rosario MRN: [id number] Date of Birth: 11-10-1922 Today's Date: 12/11/2017 Time: SLP Start Time (ACUTE ONLY): 1140 -SLP Stop Time (ACUTE ONLY): 1205 SLP Time Calculation (min) (ACUTE ONLY): 25 min Past Medical History: Past Medical History: Diagnosis Date . Arthritis  . Atrial fibrillation ([place]) Sept.  2011  [facility] Admission for RVR . Chronic low back pain   due to lumbosacral degenerative joint disease . Disturbance of skin sensation  . Embolic stroke ([place]) 35/0093 . Hyperlipidemia  . Hypertension  . Mitral valve disorders(424.0)  . Osteoporosis  . Sciatica  .  Thyroid disease   Hypothyroidism . Vascular dementia, uncomplicated  Past Surgical History: Past Surgical History: Procedure Laterality Date . JOINT REPLACEMENT    Right Hip Oct 2011,  Left Knee 2008 ([place]) . KNEE SURGERY    Left knee . THYROIDECTOMY   . TOTAL HIP ARTHROPLASTY  2011 HPI: Almer Littleton is a 84 y.o. female with history of embolic CVA, atrial fibrillation, hypertension, hypothyroidism, dementia was brought to the ER after patient had an unwitnessed fall.  Patient lives in assisted living facility.  Patient was brought to the ER at [facility].  Patient after the fall had sustained a scalp hematoma.  CT head shows acute on chronic subdural hematoma.  On-call neurosurgeon Dr. Ronnald Ramp was consulted at [facility] and patient transferred to [facility] for further management.  Patient has baseline dementia. CXR shows Cardiomegaly with vascular congestion, bibasilar atelectasis and probable small effusions.  No data recorded Assessment / Plan / Recommendation CHL IP CLINICAL IMPRESSIONS 12/11/2017 Clinical Impression Pt demonstrates a mild oropharyngeal dysphagia with slightly decreased duration of pharyngeal peristalsis, leaving mild vallecualr and pyriform sinus residuals. Trace penetration, eventually squeezed out occurs with cup sips, trace aspiration occurred with consecutive straw sips, though pt immediately sensed tracheal aspirate and ejected it with a cough.  Residue more significant with thickened textures; pt also does not follow commands well for successful use of strategies. Coughing without penetration/aspiration also occurred, possibly related to appearance of stasis in the mid to upper esophagus on esophageal sweep. Given low risk of aspiration despite impairments, recommend pt continue a dys 3/thin liquid diet will f/u for tolerance.  SLP Visit Diagnosis Dysphagia, oropharyngeal phase (R13.12) Attention and concentration deficit following -- Frontal lobe and  executive function deficit following -- Impact on safety and function --   CHL IP TREATMENT RECOMMENDATION 12/11/2017 Treatment Recommendations Defer treatment plan to f/u with SLP   No flowsheet data found. CHL IP DIET RECOMMENDATION 12/11/2017 SLP Diet Recommendations Dysphagia 3 (Mech soft) solids;Thin liquid Liquid Administration via Cup;Straw Medication Administration Whole meds with puree Compensations Minimize environmental distractions Postural Changes Remain semi-upright after after feeds/meals (Comment)   CHL IP OTHER RECOMMENDATIONS 12/11/2017 Recommended Consults -- Oral Care Recommendations Oral care BID Other Recommendations --   CHL IP FOLLOW UP RECOMMENDATIONS 12/11/2017 Follow up Recommendations Skilled Nursing facility   [facility] IP FREQUENCY AND DURATION 12/11/2017 Speech Therapy Frequency (ACUTE ONLY) min 2x/week Treatment Duration 2 weeks      CHL IP ORAL PHASE 12/11/2017 Oral Phase Impaired Oral - Pudding Teaspoon -- Oral - Pudding Cup -- Oral - Honey Teaspoon -- Oral - Honey Cup -- Oral - Nectar Teaspoon -- Oral - Nectar Cup Decreased bolus cohesion Oral - Nectar Straw -- Oral - Thin Teaspoon -- Oral - Thin Cup WFL Oral - Thin Straw WFL Oral - Puree Decreased bolus cohesion Oral - Mech Soft -- Oral - Regular Decreased bolus cohesion Oral - Multi-Consistency -- Oral - Pill -- Oral Phase - Comment --  CHL IP PHARYNGEAL PHASE 12/11/2017 Pharyngeal Phase Impaired Pharyngeal- Pudding Teaspoon -- Pharyngeal -- Pharyngeal- Pudding Cup -- Pharyngeal -- Pharyngeal- Honey Teaspoon -- Pharyngeal -- Pharyngeal- Honey Cup -- Pharyngeal -- Pharyngeal- Nectar Teaspoon -- Pharyngeal --  Pharyngeal- Nectar Cup Pharyngeal residue - valleculae Pharyngeal -- Pharyngeal- Nectar Straw -- Pharyngeal -- Pharyngeal- Thin Teaspoon -- Pharyngeal -- Pharyngeal- Thin Cup Pharyngeal residue - valleculae;Pharyngeal residue - pyriform;Penetration/Aspiration during swallow;Penetration/Apiration after swallow Pharyngeal Material enters  airway, remains ABOVE vocal cords then ejected out;Material does not enter airway Pharyngeal- Thin Straw Pharyngeal residue - valleculae;Pharyngeal residue - pyriform;Trace aspiration;Penetration/Apiration after swallow Pharyngeal Material enters airway, passes BELOW cords then ejected out;Material does not enter airway Pharyngeal- Puree Pharyngeal residue - valleculae;Pharyngeal residue - pyriform Pharyngeal -- Pharyngeal- Mechanical Soft -- Pharyngeal -- Pharyngeal- Regular Pharyngeal residue - valleculae;Pharyngeal residue - pyriform Pharyngeal -- Pharyngeal- Multi-consistency -- Pharyngeal -- Pharyngeal- Pill -- Pharyngeal -- Pharyngeal Comment --  No flowsheet data found. No flowsheet data found. Herbie Baltimore, [place] [phone] Lynann Beaver 12/11/2017, 2:36 PM                LOS: 3 days   Oren Binet, MD  [facility]  If 7PM-7AM, please contact night-coverage  Please page via [email or web address]-Password [email or web address]-click on MD name and type text message  12/13/2017, 11:31 AM

## 2017-12-13 NOTE — Progress Notes (Signed)
Patient sleeping at this time.  O2 at 2L via nasal cannula, seizure pads placed on bed, tubing changed

## 2017-12-13 NOTE — Progress Notes (Signed)
SLP Cancellation Note  Patient Details Name: Donna Rosario MRN: [id number] DOB: 18-Apr-1923   Cancelled treatment:       Reason Eval/Treat Not Completed: Medical issues which prohibited therapy; rapid response called overnight and pt bit lip badly; cancel d/t LOA/medical issues; nursing stated pt is now comfort care.   Elvina Sidle, M.S., CCC-SLP 12/13/2017, 11:17 AM

## 2017-12-13 NOTE — Progress Notes (Signed)
Paged on call again. [place] returned call. Will follow orders and continue to monitor.

## 2017-12-13 NOTE — Progress Notes (Signed)
CSW following for discharge plan. Noting plan for patient to transition to full comfort measures, and hospital death anticipated. Please consult CSW if patient stabilizes enough for transition to residential hospice care.  Laveda Abbe, [place] Clinical Social Worker [phone]

## 2017-12-13 NOTE — Progress Notes (Signed)
Called to room. Patient bleeding, possibly from biting her lip or tongue. BP now 156/101.Rapid informed, on call paged.

## 2017-12-13 NOTE — Progress Notes (Signed)
Late entry for 12/13/17 @ 0630 NT bladder scanned patient, showing 677 mls. Straight cathed patient.950 cc dark yellow urine, with Barnabas Lister, RN. Patient tolerated procedure well, without complication.

## 2017-12-14 ENCOUNTER — Other Ambulatory Visit: Payer: Self-pay

## 2017-12-14 MED ORDER — ATROPINE SULFATE 1 % OP SOLN
2.0000 [drp] | Freq: Four times a day (QID) | OPHTHALMIC | Status: DC
  Filled 2017-12-14: qty 2

## 2017-12-14 MED ORDER — MORPHINE SULFATE (PF) 2 MG/ML IV SOLN: 1.0000 mg | Freq: Once | INTRAVENOUS | Status: DC

## 2017-12-14 MED ORDER — WHITE PETROLATUM EX OINT
TOPICAL_OINTMENT | CUTANEOUS | Status: AC
  Administered 2017-12-14: 0.2
  Filled 2017-12-14: qty 28.35

## 2017-12-14 MED ORDER — SCOPOLAMINE 1 MG/3DAYS TD PT72
1.0000 | MEDICATED_PATCH | TRANSDERMAL | Status: DC
  Filled 2017-12-14: qty 1

## 2017-12-23 NOTE — Progress Notes (Signed)
This nurse notes no vital signs present.  MD present on unit and aware.  [id number]

## 2017-12-23 NOTE — Death Summary Note (Addendum)
DEATH SUMMARY   Patient Details  Name: Donna Rosario MRN: [id number] DOB: 07-21-1922  Admission/Discharge Information   Admit Date:  05-Jan-2018  Date of Death: Date of Death: 09-Jan-2018  Time of Death: Time of Death: 1025-10-14  Length of Stay: 4  Referring Physician: Venia Carbon, MD   Reason(s) for Hospitalization  Subdural Hematoma  Diagnoses  Preliminary cause of death:  Secondary Diagnoses (including complications and co-morbidities):  Principal Problem:   Subdural hematoma, acute ([place]) Active Problems:   Acute hypoxic respiratory failure   Aspiration pneumonia   HYPERTENSION, BENIGN   Chronic atrial fibrillation (HCC)   Chronic diastolic CHF (congestive heart failure) ([place])   Hypothyroidism   HLD (hyperlipidemia)   Subdural hematoma (HCC)   Agitation   Palliative care by specialist   DNR (do not resuscitate)   Brief Hospital Course (including significant findings, care, treatment, and services provided and events leading to death)  Brief Narrative: Patient is a 84 y.o. female with history of chronic atrial fibrillation on anticoagulation, dementia, chronic diastolic heart failure presented from a assisted living facility secondary to a mechanical fall, she was found to have acute on chronic subdural hematoma, anticoagulation was reversed with Kcentra.  Patient was transferred to [facility] for neurosurgery evaluation.  Subsequent hospital course complicated by aspiration pneumonia causing respiratory failure, and lately by worsening encephalopathy and one episode of seizures.  After discussion with family, she was transition to full comfort measures on 6/21.  She expired at 10:27 AM on 01-10-23. See below for further details.  Assessment/Plan: Acute on chronic subdural hematoma: Evaluated by neurosurgery not a candidate for craniotomy.    She was on Eliquis prior to this hospitalization-she received Kcentra in the emergency room-Eliquis was subsequently  discontinued.  Fall resulting in scalp laceration and subdural hematoma: Laceration was sutured in the emergency room-managed with supportive care during this hospital stay.  Acute hypoxic respiratory failure secondary to aspiration pneumonia:  Hospital course complicated by aspiration pneumonia leading to acute hypoxic respiratory failure.  She continued to worsen-after extensive discussion with the patient's family-she was transition to full comfort measures, she was started on a morphine infusion-as noted above she expired at 10:27 AM on 01/10/23.    Seizure: Occurred 6/21-subsequently after which patient became much more lethargic and with decreased responsiveness.  After extensive discussion with the patient's daughter, we elected to fully transition to full comfort measures-as her overall situation was already very tenuous.  Family did not want to pursue any further work-up or treatment as the patient was already deteriorating even before she had a seizure.  She was given as needed lorazepam.  Acute Metabolic encephalopathy:  On 6/21 she had a seizure-and subsequently became unresponsive-she started accumulating secretions even more-we will not sure if she was encephalopathic from worsening hypoxia/respiratory failure or with ongoing seizures.  In any event since she was already deteriorating-after extensive discussion with family she was transition to full comfort measures-she was started on a morphine infusion.   Chronic atrial fibrillation:  All rate control medications were discontinued when she was transitioned to full comfort measures.    Chronic diastolic heart failure: Volume status remained-all diuretics were discontinued when she was transitioned to full comfort measures.   Hypothyroidism:  Thyroid supplementation was stopped as she was transitioned to comfort measures.  Hypertension:  Antihypertensives were held after she was transitioned to full comfort  measures.  Dyslipidemia:  Statin was stopped after she was transition to full comfort measures.  Dementia with delirium:  As per course was complicated by agitation and confusion-initially she was started on low-dose Risperdal.  However when she was transition to full comfort measures-she was managed with as needed Ativan and morphine infusion.    Pertinent Labs and Studies  Significant Diagnostic Studies Ct Head Wo Contrast  Result Date: 11/26/2017 CLINICAL DATA:  Recent fall with posterior scalp laceration EXAM: CT HEAD WITHOUT CONTRAST TECHNIQUE: Contiguous axial images were obtained from the base of the skull through the vertex without intravenous contrast. COMPARISON:  10/25/17 FINDINGS: Brain: Diffuse atrophic changes and chronic white matter ischemic changes are again noted stable from the prior exam. There is evidence of a right-sided subdural hematoma which appears to have both acute and chronic components. Its maximum thickness is approximately 7 mm. No significant mass effect or midline shift is noted. No other hemorrhage is identified. Vascular: No hyperdense vessel or unexpected calcification. Skull: Normal. Negative for fracture or focal lesion. Sinuses/Orbits: Considerable sinus disease is identified with air-fluid levels within the sphenoid and maxillary sinuses on the left. Other: Scalp hematoma with laceration and surgical staples is noted in the right posterior parietal region. IMPRESSION: Right-sided acute on chronic subdural hematoma as described. Atrophic and ischemic changes. Right scalp hematoma as described. Sinus changes on the left of uncertain chronicity. Air-fluid levels are noted as described. Critical Value/emergent results were called by telephone at the time of interpretation on 12/08/2017 at 12:30 pm to Dr. Lenise Arena , who verbally acknowledged these results. Electronically Signed   By: Inez Catalina M.D.   On: 11/27/2017 12:35   Dg Chest Port 1 View  Result  Date: 12/12/2017 CLINICAL DATA:  Shortness of breath.  Wheezing. EXAM: PORTABLE CHEST 1 VIEW COMPARISON:  12/11/2017. FINDINGS: Mediastinum and hilar structures normal. Stable cardiomegaly. Interim partial clearing of bibasilar atelectasis and small pleural effusions. No pneumothorax. Old left rib fractures. IMPRESSION: 1.  Stable cardiomegaly and mild vascular congestion. 2. Interim partial clearing of bibasilar atelectasis and small pleural effusions. Electronically Signed   By: Marcello Moores  Register   On: 12/12/2017 08:43   Dg Chest Port 1 View  Result Date: 12/11/2017 CLINICAL DATA:  Leukocytosis EXAM: PORTABLE CHEST 1 VIEW COMPARISON:  08/04/2016 FINDINGS: Cardiomegaly. Mild vascular congestion. Bibasilar atelectasis and possible small layering effusions. No acute bony abnormality. IMPRESSION: Cardiomegaly with vascular congestion, bibasilar atelectasis and probable small effusions. Electronically Signed   By: Rolm Baptise M.D.   On: 12/11/2017 08:05   Dg Swallowing Func-speech Pathology  Result Date: 12/11/2017 Objective Swallowing Evaluation: Type of Study: MBS-Modified Barium Swallow Study  Patient Details Name: Donna Rosario MRN: [id number] Date of Birth: 1922/11/20 Today's Date: 12/11/2017 Time: SLP Start Time (ACUTE ONLY): 1140 -SLP Stop Time (ACUTE ONLY): 1205 SLP Time Calculation (min) (ACUTE ONLY): 25 min Past Medical History: Past Medical History: Diagnosis Date . Arthritis  . Atrial fibrillation [facility]) Sept. 2011  [facility] Admission for RVR . Chronic low back pain   due to lumbosacral degenerative joint disease . Disturbance of skin sensation  . Embolic stroke ([place]) 97/6734 . Hyperlipidemia  . Hypertension  . Mitral valve disorders(424.0)  . Osteoporosis  . Sciatica  . Thyroid disease   Hypothyroidism . Vascular dementia, uncomplicated  Past Surgical History: Past Surgical History: Procedure Laterality Date . JOINT REPLACEMENT    Right Hip Oct 2011,  Left Knee 2008 ([place]) . KNEE SURGERY    Left knee .  THYROIDECTOMY   . TOTAL HIP ARTHROPLASTY  2011 HPI: Donna Rosario is a 84 y.o. female with history of  embolic CVA, atrial fibrillation, hypertension, hypothyroidism, dementia was brought to the ER after patient had an unwitnessed fall.  Patient lives in assisted living facility.  Patient was [facility] ER [facility].  Patient after the fall had sustained a scalp hematoma.  CT head shows acute on chronic subdural hematoma.  On-call neurosurgeon Dr. Ronnald Ramp was consulted at [facility] and patient transferred to [facility] for further management.  Patient has baseline dementia. CXR shows Cardiomegaly with vascular congestion, bibasilar atelectasis and probable small effusions.  No data recorded Assessment / Plan / Recommendation CHL IP CLINICAL IMPRESSIONS 12/11/2017 Clinical Impression Pt demonstrates a mild oropharyngeal dysphagia with slightly decreased duration of pharyngeal peristalsis, leaving mild vallecualr and pyriform sinus residuals. Trace penetration, eventually squeezed out occurs with cup sips, trace aspiration occurred with consecutive straw sips, though pt immediately sensed tracheal aspirate and ejected it with a cough.  Residue more significant with thickened textures; pt also does not follow commands well for successful use of strategies. Coughing without penetration/aspiration also occurred, possibly related to appearance of stasis in the mid to upper esophagus on esophageal sweep. Given low risk of aspiration despite impairments, recommend pt continue a dys 3/thin liquid diet will f/u for tolerance.  SLP Visit Diagnosis Dysphagia, oropharyngeal phase (R13.12) Attention and concentration deficit following -- Frontal lobe and executive function deficit following -- Impact on safety and function --   CHL IP TREATMENT RECOMMENDATION 12/11/2017 Treatment Recommendations Defer treatment plan to f/u with SLP   No flowsheet data found. CHL IP DIET RECOMMENDATION 12/11/2017  SLP Diet Recommendations Dysphagia 3 (Mech soft) solids;Thin liquid Liquid Administration via Cup;Straw Medication Administration Whole meds with puree Compensations Minimize environmental distractions Postural Changes Remain semi-upright after after feeds/meals (Comment)   CHL IP OTHER RECOMMENDATIONS 12/11/2017 Recommended Consults -- Oral Care Recommendations Oral care BID Other Recommendations --   CHL IP FOLLOW UP RECOMMENDATIONS 12/11/2017 Follow up Recommendations Skilled Nursing facility   [facility] IP FREQUENCY AND DURATION 12/11/2017 Speech Therapy Frequency (ACUTE ONLY) min 2x/week Treatment Duration 2 weeks      CHL IP ORAL PHASE 12/11/2017 Oral Phase Impaired Oral - Pudding Teaspoon -- Oral - Pudding Cup -- Oral - Honey Teaspoon -- Oral - Honey Cup -- Oral - Nectar Teaspoon -- Oral - Nectar Cup Decreased bolus cohesion Oral - Nectar Straw -- Oral - Thin Teaspoon -- Oral - Thin Cup WFL Oral - Thin Straw WFL Oral - Puree Decreased bolus cohesion Oral - Mech Soft -- Oral - Regular Decreased bolus cohesion Oral - Multi-Consistency -- Oral - Pill -- Oral Phase - Comment --  CHL IP PHARYNGEAL PHASE 12/11/2017 Pharyngeal Phase Impaired Pharyngeal- Pudding Teaspoon -- Pharyngeal -- Pharyngeal- Pudding Cup -- Pharyngeal -- Pharyngeal- Honey Teaspoon -- Pharyngeal -- Pharyngeal- Honey Cup -- Pharyngeal -- Pharyngeal- Nectar Teaspoon -- Pharyngeal -- Pharyngeal- Nectar Cup Pharyngeal residue - valleculae Pharyngeal -- Pharyngeal- Nectar Straw -- Pharyngeal -- Pharyngeal- Thin Teaspoon -- Pharyngeal -- Pharyngeal- Thin Cup Pharyngeal residue - valleculae;Pharyngeal residue - pyriform;Penetration/Aspiration during swallow;Penetration/Apiration after swallow Pharyngeal Material enters airway, remains ABOVE vocal cords then ejected out;Material does not enter airway Pharyngeal- Thin Straw Pharyngeal residue - valleculae;Pharyngeal residue - pyriform;Trace aspiration;Penetration/Apiration after swallow Pharyngeal Material enters  airway, passes BELOW cords then ejected out;Material does not enter airway Pharyngeal- Puree Pharyngeal residue - valleculae;Pharyngeal residue - pyriform Pharyngeal -- Pharyngeal- Mechanical Soft -- Pharyngeal -- Pharyngeal- Regular Pharyngeal residue - valleculae;Pharyngeal residue - pyriform Pharyngeal -- Pharyngeal- Multi-consistency -- Pharyngeal -- Pharyngeal-  Pill -- Pharyngeal -- Pharyngeal Comment --  No flowsheet data found. No flowsheet data found. Herbie [place], [place] [phone] Lynann Beaver 12/11/2017, 2:36 PM               Microbiology No results found for this or any previous visit (from the past 240 hour(s)).  Lab Basic Metabolic Panel: Recent Labs  Lab 12/19/2017 1249 12/11/17 0609 12/12/17 0639  NA 136 139 136  K 4.1 3.6 3.2*  CL 104 107 104  CO2 22 25 21*  GLUCOSE 129* 139* 147*  BUN 24* 18 17  CREATININE 0.82 0.86 0.88  CALCIUM 8.4* 8.5* 8.6*   Liver Function Tests: Recent Labs  Lab 12/15/2017 1249 12/11/17 0609  AST 46* 30  ALT 32 26  ALKPHOS 121 92  BILITOT 1.3* 1.1  PROT 6.9 6.1*  ALBUMIN 3.3* 2.8*   No results for input(s): LIPASE, AMYLASE in the last 168 hours. No results for input(s): AMMONIA in the last 168 hours. CBC: Recent Labs  Lab 12/22/2017 1249 12/11/17 0609 12/12/17 0639  WBC 16.0* 15.7* 17.4*  NEUTROABS 13.1*  --   --   HGB 13.1 12.3 12.9  HCT 39.7 38.3 39.5  MCV 86.4 87.4 86.6  PLT 257 273 283   Cardiac Enzymes: Recent Labs  Lab 12/09/2017 1249  TROPONINI <0.03   Sepsis Labs: Recent Labs  Lab 12/19/2017 1249 12/11/17 0609 12/12/17 0639  WBC 16.0* 15.7* 17.4*    Procedures/Operations  None   Donna Rosario January 11, 2018, 1:53 PM

## 2017-12-23 NOTE — Progress Notes (Signed)
[facility] notified of death Ref # [id number].  AKingBSNRN

## 2017-12-23 NOTE — Progress Notes (Signed)
Spoke with pt's daughter Meredith Mody, notified pt expired.  AKingBSNRN

## 2017-12-23 DEATH — deceased

## 2018-07-08 IMAGING — CT CT HIP*R* W/O CM
2 of 5 series · 16 of 46 positions shown, 18 images · non-contrast
Comparison: None.

CLINICAL DATA: Right hip pain for 1 month.

EXAM:
CT OF THE RIGHT HIP WITHOUT CONTRAST
TECHNIQUE: Multidetector CT imaging of the right hip was performed according to
the standard protocol. Multiplanar CT image reconstructions were
also generated.

[Series 3: axial st · axial · 0.47mm/px · z∈[-1041,-821]mm · 13 of 128 slices shown, 15 images]
[im 9/128  soft-tissue]
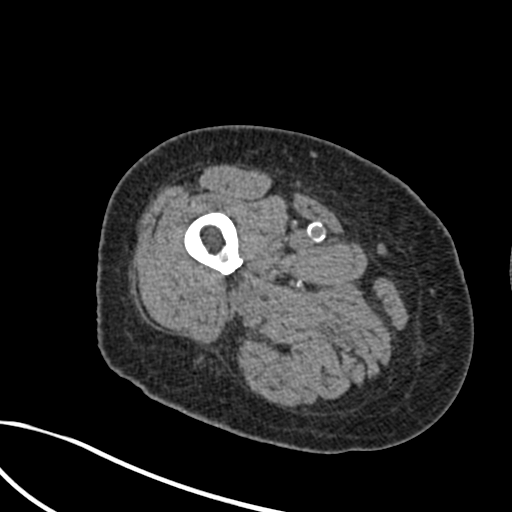
[im 9/128  bone]
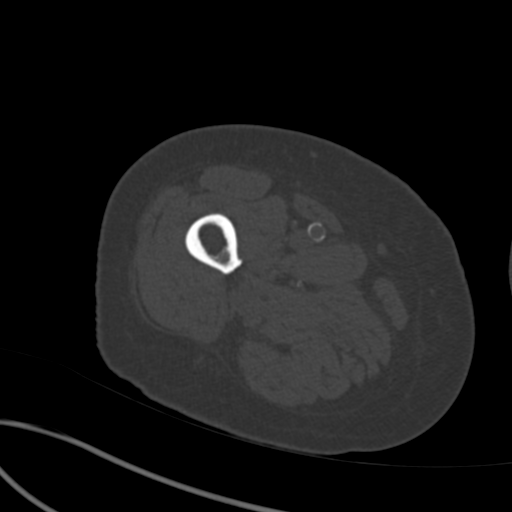
[im 17/128  soft-tissue]
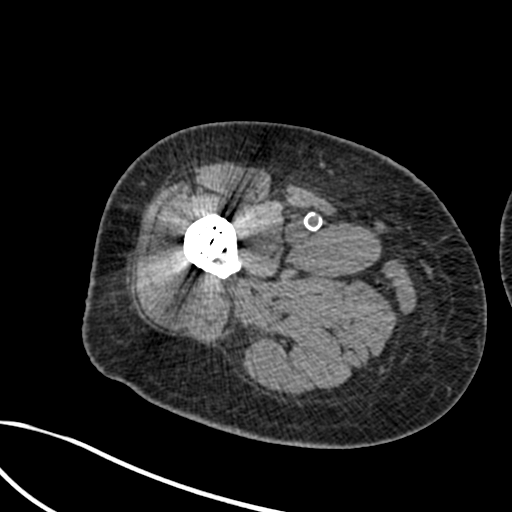
[im 26/128  soft-tissue]
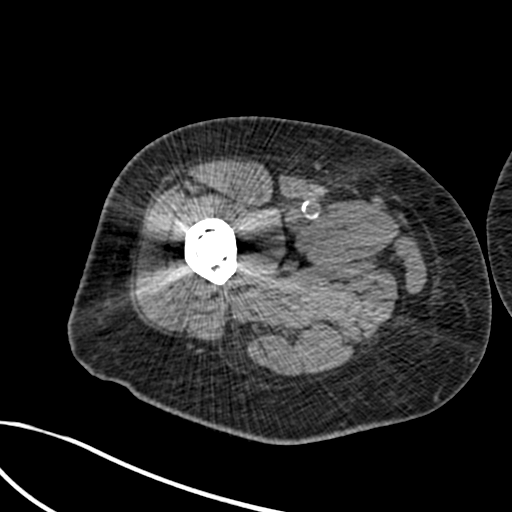
[im 34/128  soft-tissue]
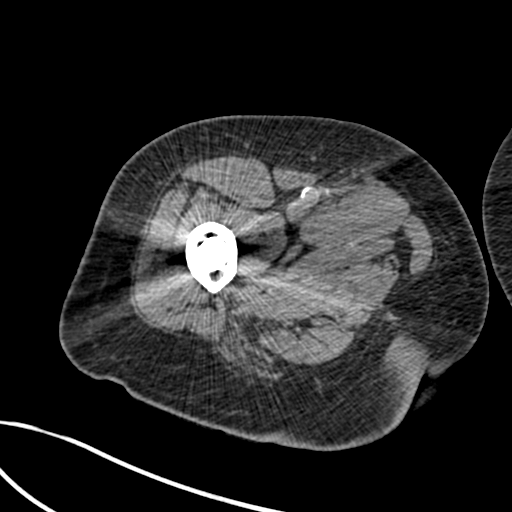
[im 43/128  soft-tissue]
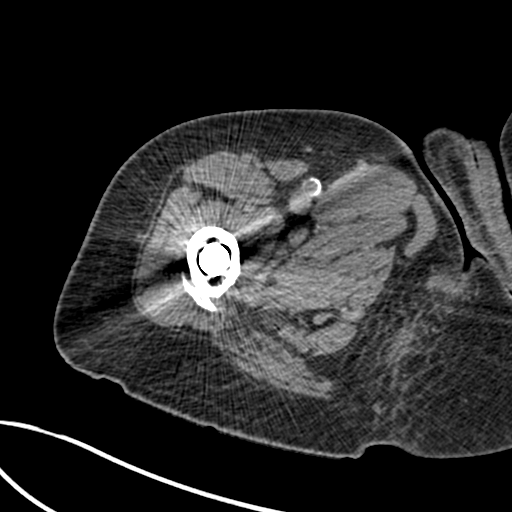
[im 51/128  soft-tissue]
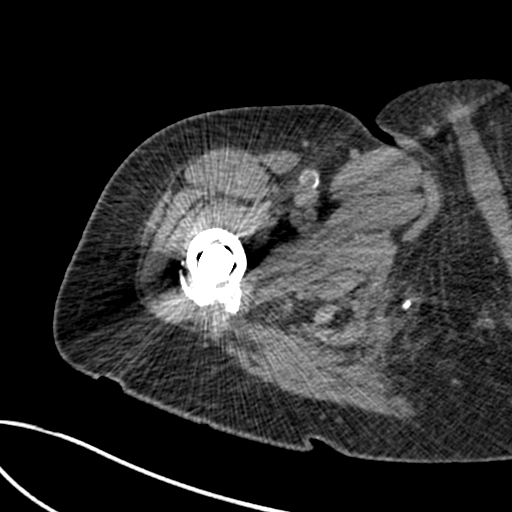
[im 68/128  soft-tissue]
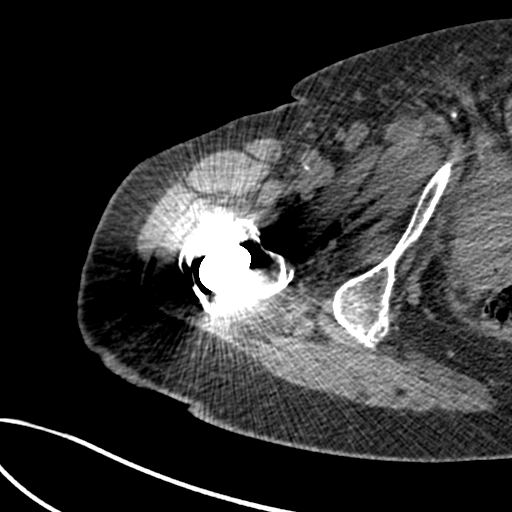
[im 77/128  soft-tissue]
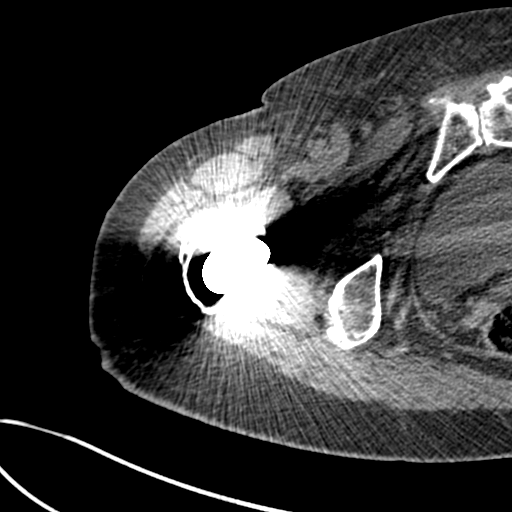
[im 85/128  soft-tissue]
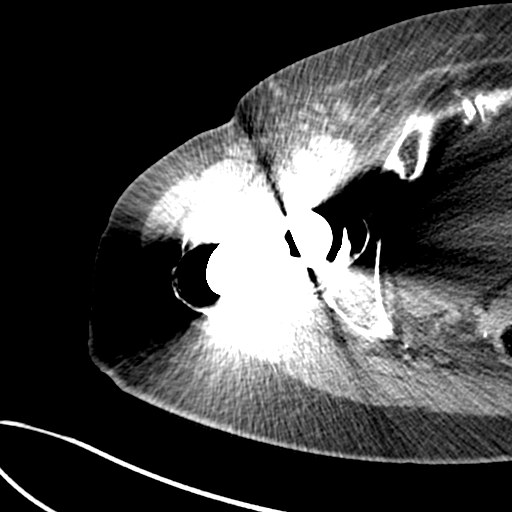
[im 85/128  bone]
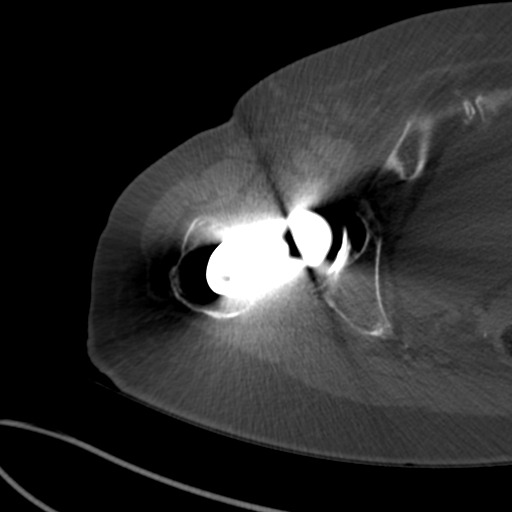
[im 94/128  soft-tissue]
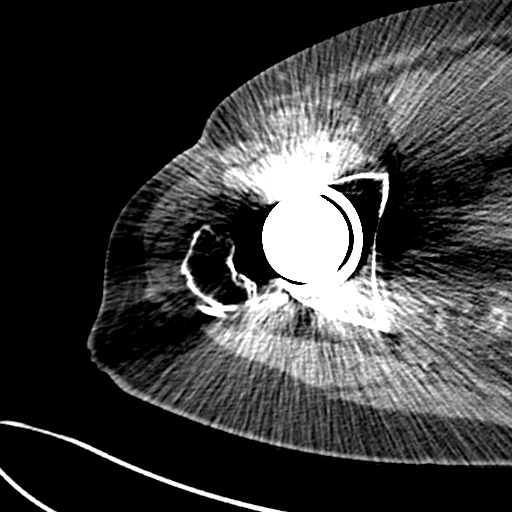
[im 102/128  soft-tissue]
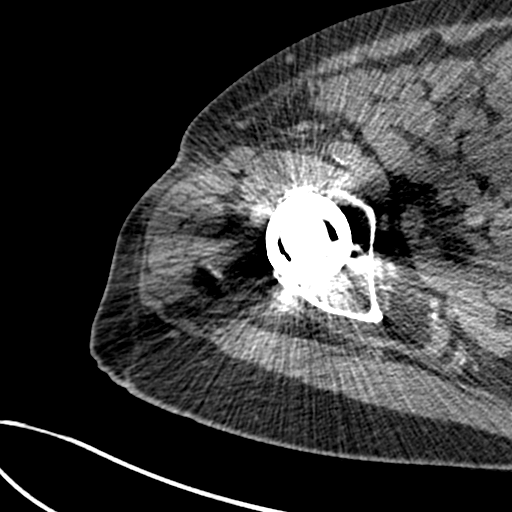
[im 111/128  soft-tissue]
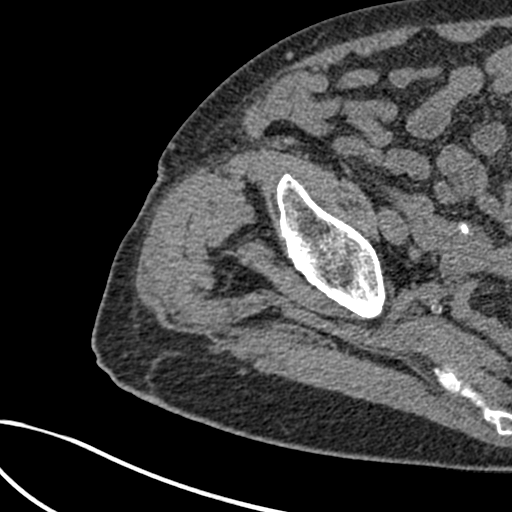
[im 119/128  soft-tissue]
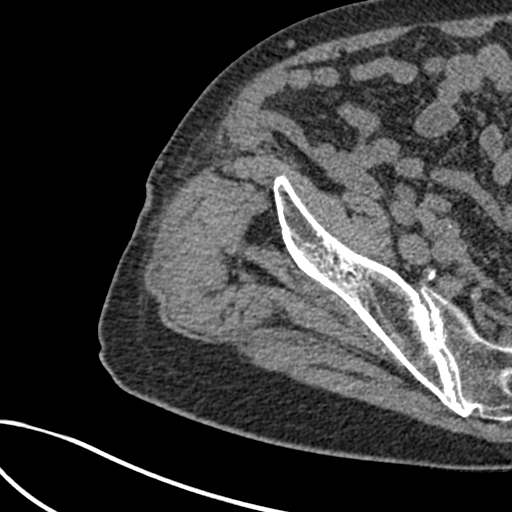

[Series 7: coronal st · coronal · 0.55mm/px · 3 of 118 slices shown]
[im 40/118  soft-tissue]
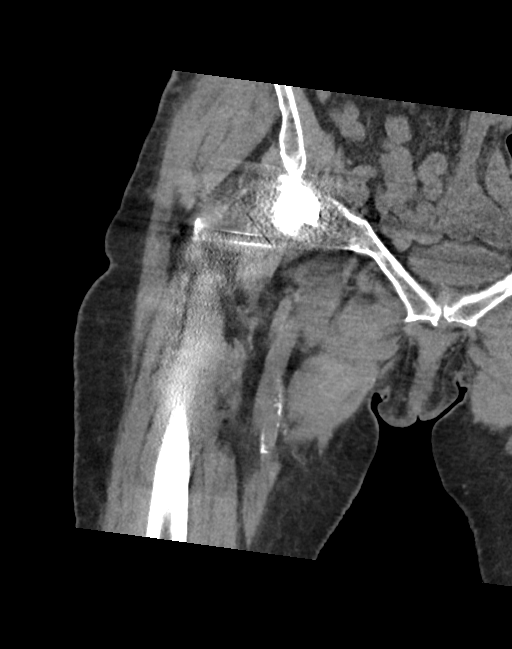
[im 59/118  soft-tissue]
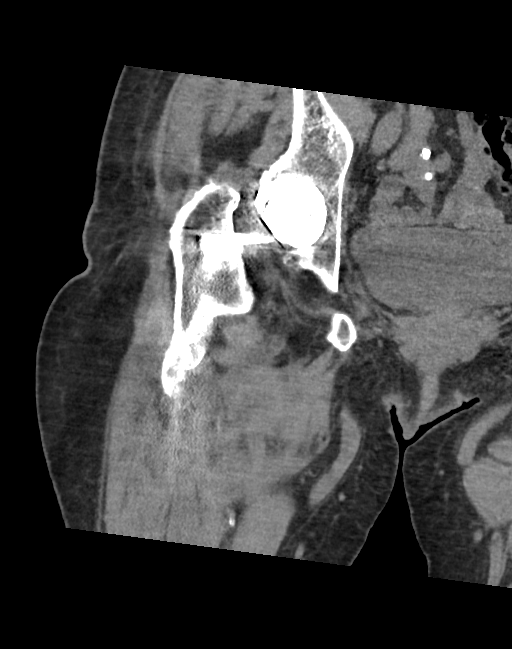
[im 79/118  soft-tissue]
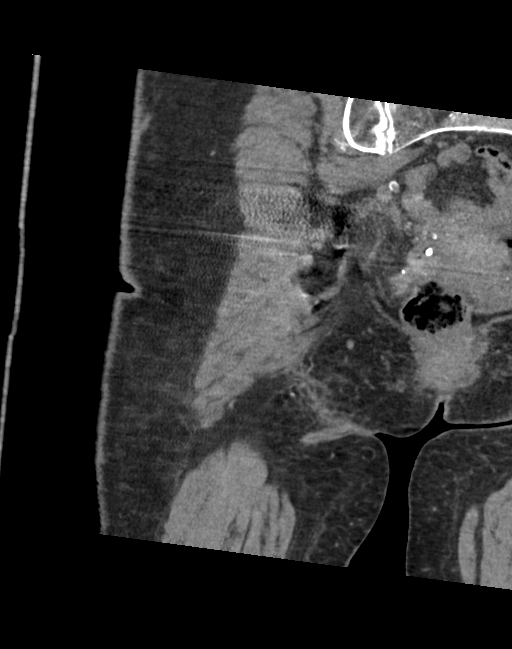

[16 of 46 positions shown; findings below may reference images not displayed]

FINDINGS: Bones/Joint/Cartilage

Right total hip arthroplasty without failure complication. No
osteolysis or periarticular fluid collection. Beam hardening
artifact resulting from the arthroplasty partially obscures adjacent
soft tissue and osseous structures. Proximal periprosthetic femoral
fracture extending from the greater trochanter to the proximal
femoral diaphysis with periosteal new bone formation with mild
persistence of the fracture cleft suggesting a subacute injury.

No acute fracture or dislocation. No lytic or sclerotic osseous
lesion. Mild osteoarthritis of the right sacroiliac joint.

Ligaments

Suboptimally assessed by CT.

Muscles and Tendons

No muscle atrophy.  No intramuscular fluid collection or hematoma.

Soft tissues

No fluid collection or hematoma. Peripheral vascular atherosclerotic
disease. No inguinal lymphadenopathy. No inguinal hernia.
IMPRESSION: 1. Right total hip arthroplasty without failure or complication.
2. Proximal periprosthetic femoral fracture extending from the
greater trochanter to the proximal femoral diaphysis with periosteal
new bone formation with mild persistence of the fracture cleft
suggesting a subacute injury.

## 2018-07-13 IMAGING — CR DG HIP (WITH OR WITHOUT PELVIS) 2-3V*L*
1 series · 3 of 3 positions shown · non-contrast
Comparison: 04/19/2016, 05/03/2016.

CLINICAL DATA: Nontraumatic bilateral hip pain for 3 weeks.

EXAM:
DG HIP (WITH OR WITHOUT PELVIS) 2-3V LEFT

[Series 1: dg hip unilat w or w/o pelvis 2-3 views  · non-contrast · 0.14mm/px · 3 of 3 slices shown]
[im 1/3]
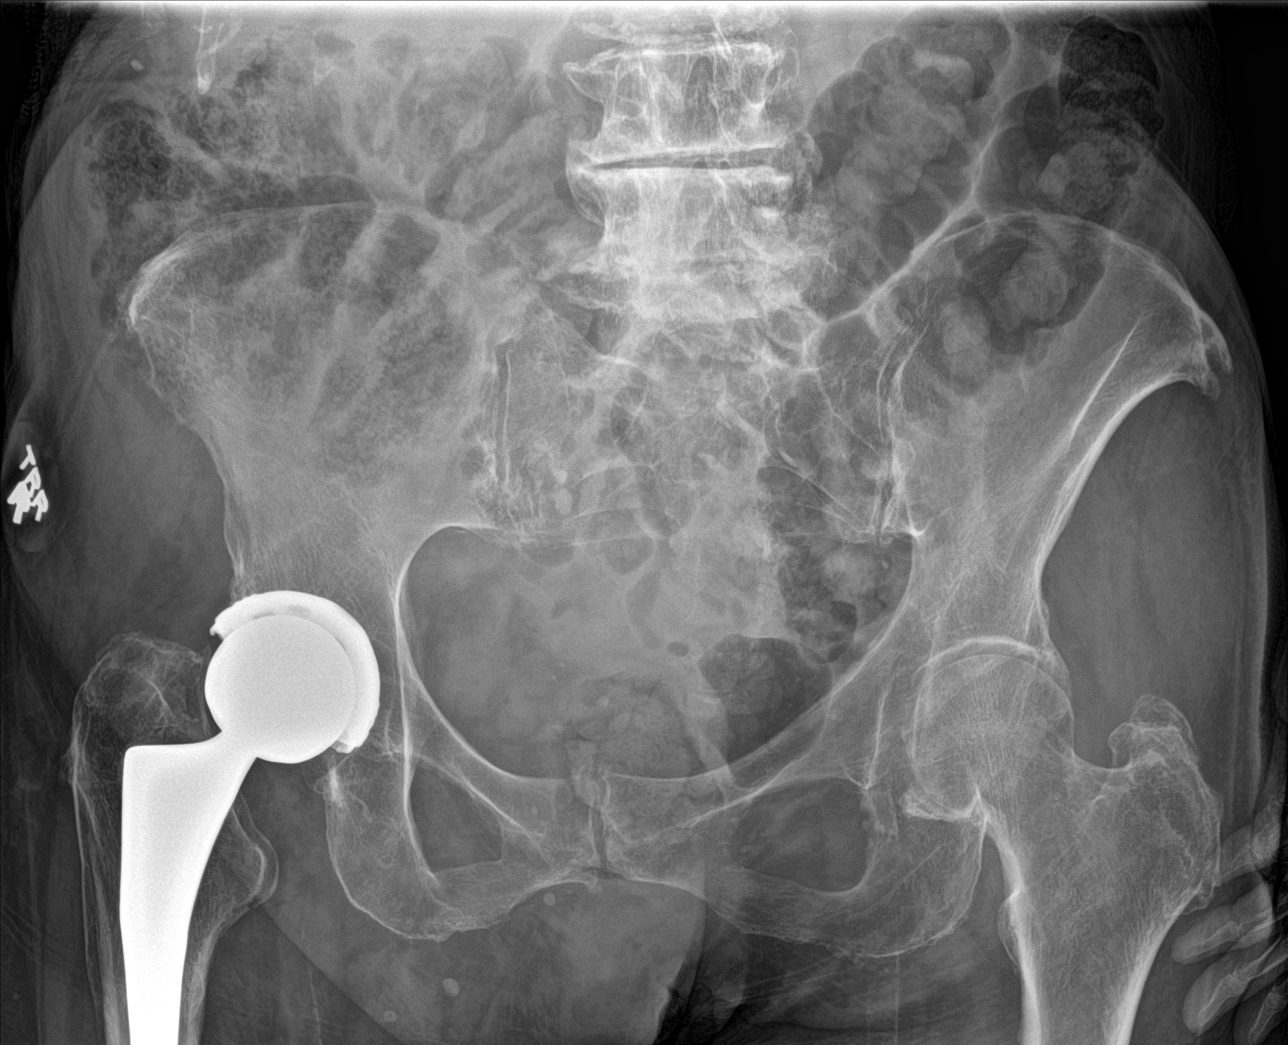
[im 2/3]
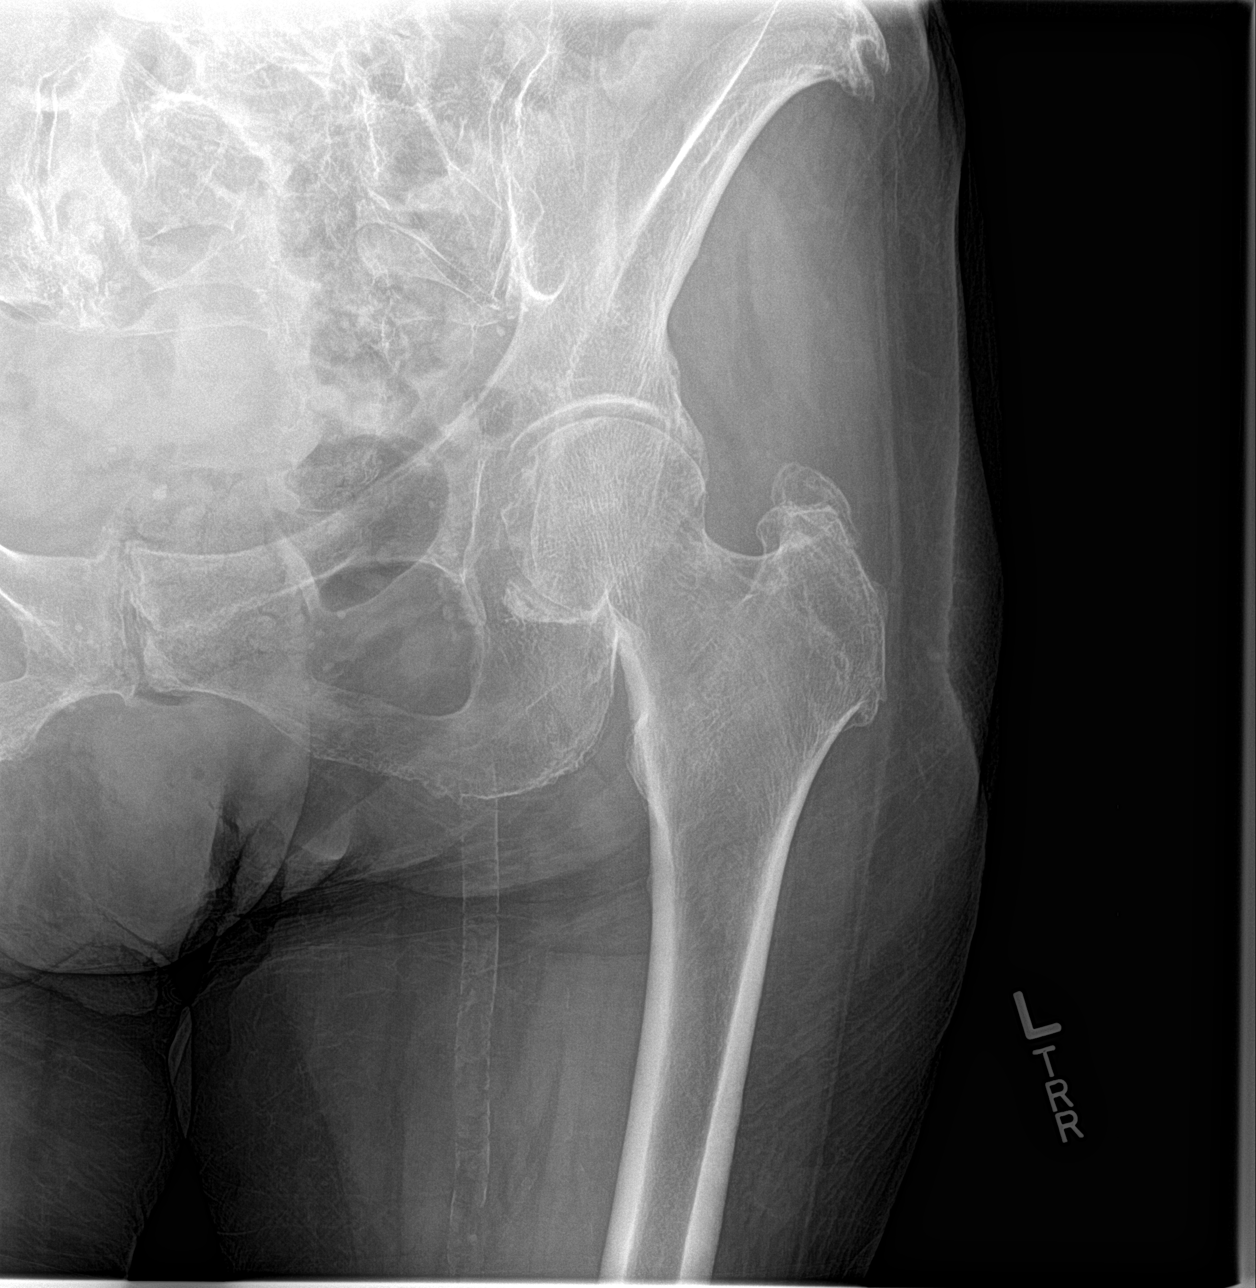
[im 3/3]
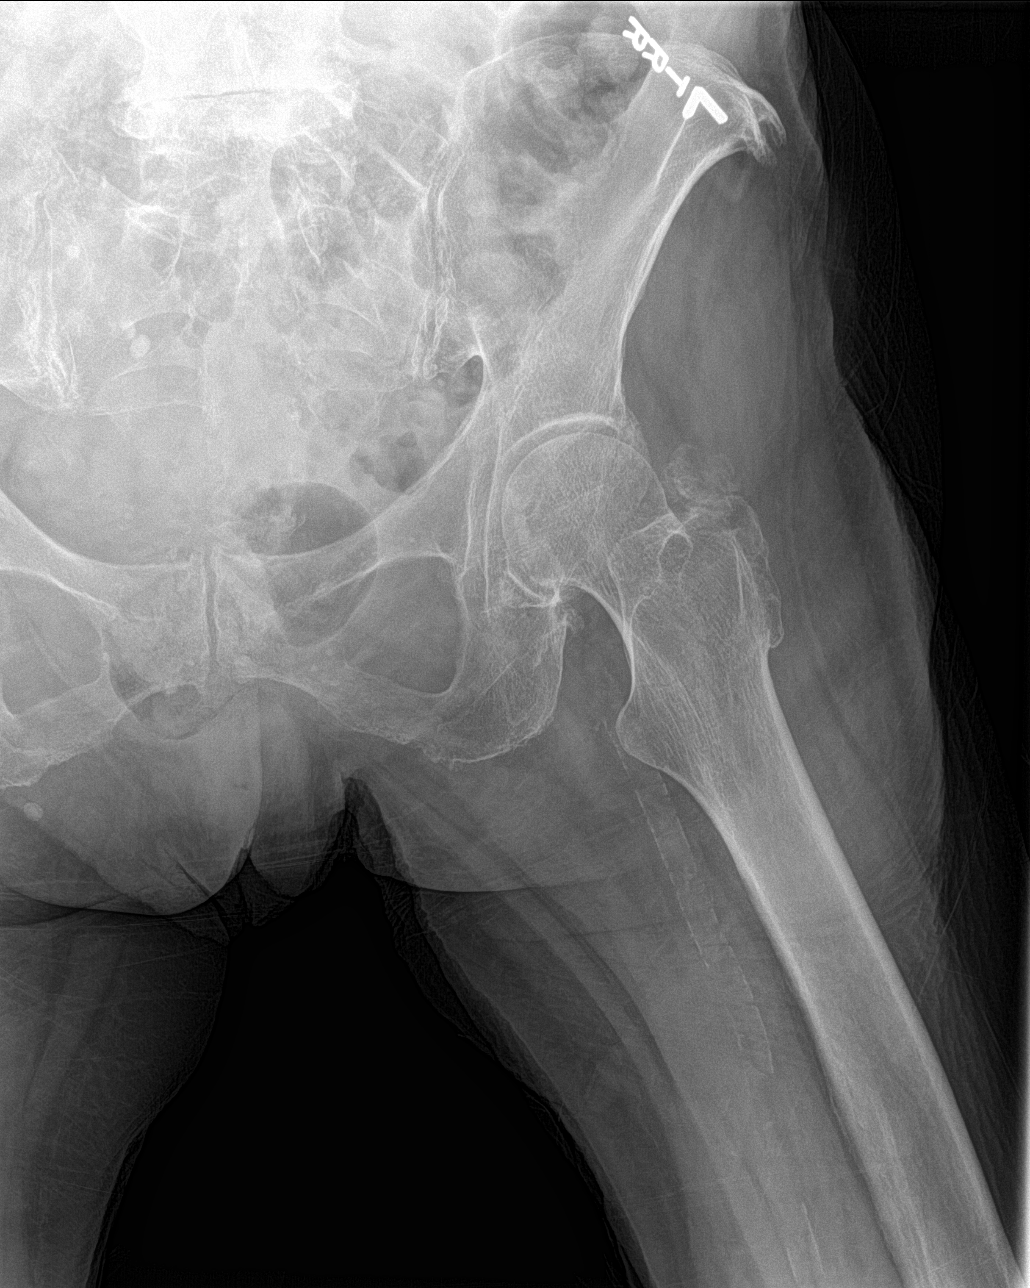

[3 of 3 positions shown; findings below may reference images not displayed]

FINDINGS: Negative for acute fracture or dislocation about the left hip.
Fragmented appearance of the greater trochanter is unchanged and
appears chronic. Subacute proximal right femoral periprosthetic
fracture is not well seen on this study; better seen on the recent
CT. Pubic rami appear intact. Pubic symphysis and sacroiliac joints
appear intact. No bone lesion or bony destruction.
IMPRESSION: Negative for acute fracture.
